# Patient Record
Sex: Male | Born: 1960 | Race: Black or African American | Hispanic: No | Marital: Single | Smoking: Former smoker
Health system: Southern US, Community
[De-identification: ages and names within clinical notes are randomized; demographics above are authoritative.]

## PROBLEM LIST (undated history)

## (undated) DIAGNOSIS — F329 Major depressive disorder, single episode, unspecified: Secondary | ICD-10-CM

## (undated) DIAGNOSIS — J449 Chronic obstructive pulmonary disease, unspecified: Secondary | ICD-10-CM

## (undated) DIAGNOSIS — F32A Depression, unspecified: Secondary | ICD-10-CM

## (undated) DIAGNOSIS — R0981 Nasal congestion: Secondary | ICD-10-CM

## (undated) HISTORY — DX: Chronic obstructive pulmonary disease, unspecified: J44.9

---

## 1999-07-17 ENCOUNTER — Emergency Department (HOSPITAL_COMMUNITY): Admission: EM | Admit: 1999-07-17 | Discharge: 1999-07-17 | Payer: Self-pay | Admitting: Emergency Medicine

## 1999-07-18 ENCOUNTER — Encounter: Payer: Self-pay | Admitting: Emergency Medicine

## 2004-07-20 ENCOUNTER — Emergency Department (HOSPITAL_COMMUNITY): Admission: EM | Admit: 2004-07-20 | Discharge: 2004-07-20 | Payer: Self-pay | Admitting: Emergency Medicine

## 2018-05-27 ENCOUNTER — Emergency Department (HOSPITAL_COMMUNITY): Payer: Self-pay

## 2018-05-27 ENCOUNTER — Other Ambulatory Visit: Payer: Self-pay

## 2018-05-27 ENCOUNTER — Emergency Department (HOSPITAL_COMMUNITY)
Admission: EM | Admit: 2018-05-27 | Discharge: 2018-05-27 | Disposition: A | Discharge status: Home / Self Care | Payer: Self-pay | Attending: Emergency Medicine | Admitting: Emergency Medicine

## 2018-05-27 ENCOUNTER — Encounter (HOSPITAL_COMMUNITY): Payer: Self-pay | Admitting: Emergency Medicine

## 2018-05-27 DIAGNOSIS — N419 Inflammatory disease of prostate, unspecified: Secondary | ICD-10-CM | POA: Insufficient documentation

## 2018-05-27 DIAGNOSIS — N50812 Left testicular pain: Secondary | ICD-10-CM | POA: Insufficient documentation

## 2018-05-27 DIAGNOSIS — N4889 Other specified disorders of penis: Secondary | ICD-10-CM | POA: Insufficient documentation

## 2018-05-27 DIAGNOSIS — R319 Hematuria, unspecified: Secondary | ICD-10-CM

## 2018-05-27 DIAGNOSIS — F17210 Nicotine dependence, cigarettes, uncomplicated: Secondary | ICD-10-CM | POA: Insufficient documentation

## 2018-05-27 DIAGNOSIS — R109 Unspecified abdominal pain: Secondary | ICD-10-CM

## 2018-05-27 LAB — COMPREHENSIVE METABOLIC PANEL
ALT: 8 U/L (ref 0–44)
AST: 14 U/L — ABNORMAL LOW (ref 15–41)
Albumin: 3.4 g/dL — ABNORMAL LOW (ref 3.5–5.0)
Alkaline Phosphatase: 66 U/L (ref 38–126)
Anion gap: 7 (ref 5–15)
BUN: 13 mg/dL (ref 6–20)
CO2: 26 mmol/L (ref 22–32)
Calcium: 8.5 mg/dL — ABNORMAL LOW (ref 8.9–10.3)
Chloride: 104 mmol/L (ref 98–111)
Creatinine, Ser: 1.06 mg/dL (ref 0.61–1.24)
GFR calc Af Amer: >60 mL/min (ref >60)
GFR calc non Af Amer: >60 mL/min (ref >60)
Glucose, Bld: 96 mg/dL (ref 70–99)
Potassium: 3.9 mmol/L (ref 3.5–5.1)
Sodium: 137 mmol/L (ref 135–145)
Total Bilirubin: 0.5 mg/dL (ref 0.3–1.2)
Total Protein: 8.3 g/dL — ABNORMAL HIGH (ref 6.5–8.1)

## 2018-05-27 LAB — URINALYSIS, ROUTINE W REFLEX MICROSCOPIC
Bacteria, UA: NONE SEEN
Bilirubin Urine: NEGATIVE
Glucose, UA: NEGATIVE mg/dL
Hgb urine dipstick: NEGATIVE
Ketones, ur: NEGATIVE mg/dL
Nitrite: NEGATIVE
Protein, ur: NEGATIVE mg/dL
Specific Gravity, Urine: >1.046 — ABNORMAL HIGH (ref 1.005–1.030)
WBC, UA: >50 WBC/hpf — ABNORMAL HIGH (ref 0–5)
pH: 6 (ref 5.0–8.0)

## 2018-05-27 LAB — CBC WITH DIFFERENTIAL/PLATELET
Abs Immature Granulocytes: 0.05 10*3/uL (ref 0.00–0.07)
Basophils Absolute: 0.1 10*3/uL (ref 0.0–0.1)
Basophils Relative: 0 %
Eosinophils Absolute: 0.2 10*3/uL (ref 0.0–0.5)
Eosinophils Relative: 1 %
HCT: 37.3 % — ABNORMAL LOW (ref 39.0–52.0)
Hemoglobin: 11.5 g/dL — ABNORMAL LOW (ref 13.0–17.0)
Immature Granulocytes: 0 %
Lymphocytes Relative: 18 %
Lymphs Abs: 2.5 10*3/uL (ref 0.7–4.0)
MCH: 26.1 pg (ref 26.0–34.0)
MCHC: 30.8 g/dL (ref 30.0–36.0)
MCV: 84.6 fL (ref 80.0–100.0)
Monocytes Absolute: 1.6 10*3/uL — ABNORMAL HIGH (ref 0.1–1.0)
Monocytes Relative: 11 %
Neutro Abs: 9.5 10*3/uL — ABNORMAL HIGH (ref 1.7–7.7)
Neutrophils Relative %: 70 %
Platelets: 391 10*3/uL (ref 150–400)
RBC: 4.41 MIL/uL (ref 4.22–5.81)
RDW: 14.8 % (ref 11.5–15.5)
WBC: 13.9 10*3/uL — ABNORMAL HIGH (ref 4.0–10.5)
nRBC: 0 % (ref 0.0–0.2)

## 2018-05-27 MED ORDER — SODIUM CHLORIDE 0.9 % IV BOLUS
500.0000 mL | Freq: Once | INTRAVENOUS | Status: AC
  Administered 2018-05-27: 500 mL via INTRAVENOUS

## 2018-05-27 MED ORDER — HYDROCODONE-ACETAMINOPHEN 5-325 MG PO TABS
1.0000 | ORAL_TABLET | Freq: Once | ORAL | Status: AC
  Administered 2018-05-27: 1 via ORAL
  Filled 2018-05-27: qty 1

## 2018-05-27 MED ORDER — ACETAMINOPHEN 500 MG PO TABS
1000.0000 mg | ORAL_TABLET | Freq: Once | ORAL | Status: AC
  Administered 2018-05-27: 1000 mg via ORAL
  Filled 2018-05-27: qty 2

## 2018-05-27 MED ORDER — FENTANYL CITRATE (PF) 100 MCG/2ML IJ SOLN
50.0000 ug | INTRAMUSCULAR | Status: DC | PRN
  Administered 2018-05-27: 50 ug via INTRAVENOUS
  Filled 2018-05-27: qty 2

## 2018-05-27 MED ORDER — IOPAMIDOL (ISOVUE-300) INJECTION 61%
100.0000 mL | Freq: Once | INTRAVENOUS | Status: AC | PRN
  Administered 2018-05-27: 100 mL via INTRAVENOUS

## 2018-05-27 MED ORDER — CIPROFLOXACIN HCL 500 MG PO TABS: 500.0000 mg | ORAL_TABLET | Freq: Two times a day (BID) | ORAL | 0 refills | Status: DC

## 2018-05-27 NOTE — ED Triage Notes (Signed)
PT c/o lower middle back pain with hematuria x6 months. PT also c/o hard/painful area to right buttocks beside of rectum with no drainage x2 months.

## 2018-05-27 NOTE — Discharge Instructions (Addendum)
Follow-up closely with urology and a primary doctor for your medical concerns.  Return for significantly worsening symptoms. Take antibiotics. If you were given medicines take as directed.  If you are on coumadin or contraceptives realize their levels and effectiveness is altered by many different medicines.  If you have any reaction (rash, tongues swelling, other) to the medicines stop taking and see a physician.    If your blood pressure was elevated in the ER make sure you follow up for management with a primary doctor or return for chest pain, shortness of breath or stroke symptoms.  Please follow up as directed and return to the ER or see a physician for new or worsening symptoms.  Thank you. Vitals:   05/27/18 0902 05/27/18 0930 05/27/18 1000 05/27/18 1030  BP:  (!) 100/55 (!) 100/56 107/64  Pulse:  68 71 (!) 59  Resp:  18 18 16   Temp:      TempSrc:      SpO2:  100% 100% 100%  Weight: 59 kg     Height: 5' 7.5" (1.715 m)
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
## 2018-05-27 NOTE — ED Notes (Signed)
Pt transported to Ultrasound via [facility].

## 2018-05-28 LAB — URINE CULTURE: Culture: NO GROWTH

## 2018-07-01 ENCOUNTER — Other Ambulatory Visit: Payer: Self-pay | Admitting: Urology

## 2018-07-01 DIAGNOSIS — N360 Urethral fistula: Secondary | ICD-10-CM

## 2018-07-08 ENCOUNTER — Encounter (HOSPITAL_COMMUNITY): Payer: Self-pay

## 2018-07-08 ENCOUNTER — Ambulatory Visit (HOSPITAL_COMMUNITY)
Admission: RE | Admit: 2018-07-08 | Discharge: 2018-07-08 | Disposition: A | Discharge status: Home / Self Care | Payer: Self-pay | Source: Ambulatory Visit | Attending: Urology | Admitting: Urology

## 2018-07-08 DIAGNOSIS — N360 Urethral fistula: Secondary | ICD-10-CM | POA: Insufficient documentation

## 2018-07-10 ENCOUNTER — Encounter (HOSPITAL_COMMUNITY): Payer: Self-pay | Admitting: Certified Registered Nurse Anesthetist

## 2018-07-10 ENCOUNTER — Telehealth (HOSPITAL_COMMUNITY): Payer: Self-pay | Admitting: *Deleted

## 2018-07-10 ENCOUNTER — Ambulatory Visit (HOSPITAL_COMMUNITY): Admission: RE | Admit: 2018-07-10 | Payer: Self-pay | Source: Home / Self Care | Admitting: Urology

## 2018-07-10 ENCOUNTER — Encounter (HOSPITAL_COMMUNITY): Admission: RE | Payer: Self-pay | Source: Home / Self Care

## 2018-07-10 SURGERY — CYSTOSCOPY, WITH BIOPSY
Anesthesia: General

## 2018-07-29 ENCOUNTER — Other Ambulatory Visit: Payer: Self-pay | Admitting: Urology

## 2018-08-06 NOTE — Patient Instructions (Addendum)
Gregory Camacho  08/06/2018   Your procedure is scheduled on: 08-15-18    Report to [facility] Main  Entrance    Report to Admitting at 5:30 AM    Call this number if you have problems the morning of surgery [phone]    Remember: Do not eat food or drink liquids :After Midnight.    BRUSH YOUR TEETH MORNING OF SURGERY AND RINSE YOUR MOUTH OUT, NO CHEWING GUM CANDY OR MINTS.     Take these medicines the morning of surgery with A SIP OF WATER: None                                You may not have any metal on your body including hair pins and              piercings  Do not wear jewelry, cologne, lotions, powders or deodorant             Men may shave face and neck.   Do not bring valuables to the hospital. [facility] IS NOT             RESPONSIBLE   FOR VALUABLES.  Contacts, dentures or bridgework may not be worn into surgery.  Leave suitcase in the car. After surgery it may be brought to your room.     Patients discharged the day of surgery will not be allowed to drive home. IF YOU ARE HAVING SURGERY AND GOING HOME THE SAME DAY, YOU MUST HAVE AN ADULT TO DRIVE YOU HOME AND BE WITH YOU FOR 24 HOURS. YOU MAY GO HOME BY TAXI OR UBER OR ORTHERWISE, BUT AN ADULT MUST ACCOMPANY YOU HOME AND STAY WITH YOU FOR 24 HOURS.   Name of Transporter: Philis Nettle [phone]  Special Instructions: N/A              Please read over the following fact sheets you were given: _____________________________________________________________________          [facility] - Preparing for Surgery Before surgery, you can play an important role.  Because skin is not sterile, your skin needs to be as free of germs as possible.  You can reduce the number of germs on your skin by washing with CHG (chlorahexidine gluconate) soap before surgery.  CHG is an antiseptic cleaner which kills germs and bonds with the skin to continue killing germs even after washing. Please DO NOT use if  you have an allergy to CHG or antibacterial soaps.  If your skin becomes reddened/irritated stop using the CHG and inform your nurse when you arrive at Short Stay. Do not shave (including legs and underarms) for at least 48 hours prior to the first CHG shower.  You may shave your face/neck. Please follow these instructions carefully:  1.  Shower with CHG Soap the night before surgery and the  morning of Surgery.  2.  If you choose to wash your hair, wash your hair first as usual with your  normal  shampoo.  3.  After you shampoo, rinse your hair and body thoroughly to remove the  shampoo.                           4.  Use CHG as you would any other liquid soap.  You can apply  chg directly  to the skin and wash                       Gently with a scrungie or clean washcloth.  5.  Apply the CHG Soap to your body ONLY FROM THE NECK DOWN.   Do not use on face/ open                           Wound or open sores. Avoid contact with eyes, ears mouth and genitals (private parts).                       Wash face,  Genitals (private parts) with your normal soap.             6.  Wash thoroughly, paying special attention to the area where your surgery  will be performed.  7.  Thoroughly rinse your body with warm water from the neck down.  8.  DO NOT shower/wash with your normal soap after using and rinsing off  the CHG Soap.                9.  Pat yourself dry with a clean towel.            10.  Wear clean pajamas.            11.  Place clean sheets on your bed the night of your first shower and do not  sleep with pets. Day of Surgery : Do not apply any lotions/deodorants the morning of surgery.  Please wear clean clothes to the hospital/surgery center.  FAILURE TO FOLLOW THESE INSTRUCTIONS MAY RESULT IN THE CANCELLATION OF YOUR SURGERY PATIENT SIGNATURE_________________________________  NURSE  SIGNATURE__________________________________  ________________________________________________________________________

## 2018-08-07 ENCOUNTER — Encounter (HOSPITAL_COMMUNITY): Payer: Self-pay

## 2018-08-07 ENCOUNTER — Encounter (HOSPITAL_COMMUNITY)
Admission: RE | Admit: 2018-08-07 | Discharge: 2018-08-07 | Disposition: A | Discharge status: Home / Self Care | Payer: Self-pay | Source: Ambulatory Visit | Attending: Urology | Admitting: Urology

## 2018-08-07 ENCOUNTER — Other Ambulatory Visit: Payer: Self-pay

## 2018-08-07 DIAGNOSIS — Z01812 Encounter for preprocedural laboratory examination: Secondary | ICD-10-CM | POA: Insufficient documentation

## 2018-08-07 HISTORY — DX: Nasal congestion: R09.81

## 2018-08-07 HISTORY — DX: Major depressive disorder, single episode, unspecified: F32.9

## 2018-08-07 HISTORY — DX: Depression, unspecified: F32.A

## 2018-08-07 LAB — BASIC METABOLIC PANEL
ANION GAP: 8 (ref 5–15)
BUN: 11 mg/dL (ref 6–20)
CO2: 24 mmol/L (ref 22–32)
Calcium: 8.6 mg/dL — ABNORMAL LOW (ref 8.9–10.3)
Chloride: 108 mmol/L (ref 98–111)
Creatinine, Ser: 0.7 mg/dL (ref 0.61–1.24)
GFR calc Af Amer: >60 mL/min (ref >60)
GFR calc non Af Amer: >60 mL/min (ref >60)
Glucose, Bld: 99 mg/dL (ref 70–99)
Potassium: 3.9 mmol/L (ref 3.5–5.1)
Sodium: 140 mmol/L (ref 135–145)

## 2018-08-07 LAB — CBC
HCT: 35 % — ABNORMAL LOW (ref 39.0–52.0)
Hemoglobin: 10.6 g/dL — ABNORMAL LOW (ref 13.0–17.0)
MCH: 25.8 pg — ABNORMAL LOW (ref 26.0–34.0)
MCHC: 30.3 g/dL (ref 30.0–36.0)
MCV: 85.2 fL (ref 80.0–100.0)
Platelets: 372 10*3/uL (ref 150–400)
RBC: 4.11 MIL/uL — ABNORMAL LOW (ref 4.22–5.81)
RDW: 17.3 % — ABNORMAL HIGH (ref 11.5–15.5)
WBC: 10.1 10*3/uL (ref 4.0–10.5)
nRBC: 0 % (ref 0.0–0.2)

## 2018-08-08 LAB — URINE CULTURE: Culture: NO GROWTH

## 2018-08-14 NOTE — H&P (Signed)
Urology History and Physical  Attending: Berniece Salines, MD  Chief Complaint: urethrocutaneous fistlua  HPI: Gregory Camacho is a 58 y.o. 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 Patient unable to tolerate office-based diagnostic procedures and presents today for EUA, cystourethroscopy with RUG and possible biopsy. 2/12 UCx no growth.  Today, patient denies fevers/chills, nausea/vomiting, hematuria, dysuria or concern for UTI.   Past Medical History: Past Medical History:  Diagnosis Date  . Depression   . Sinus congestion     Past Surgical History:  No past surgical history on file.  Medication: No current facility-administered medications for this encounter.    Current Outpatient Medications  Medication Sig Dispense Refill  . ibuprofen (ADVIL,MOTRIN) 200 MG tablet Take 400 mg by mouth every 6 (six) hours as needed for moderate pain.    . ciprofloxacin (CIPRO) 500 MG tablet Take 1 tablet (500 mg total) by mouth 2 (two) times daily. (Patient not taking: Reported on 07/31/2018) 28 tablet 0    Allergies: No Known Allergies  Social History: Social History   Tobacco Use  . Smoking status: Current Every Day Smoker    Packs/day: 1.00    Years: 30.00    Pack years: 30.00    Types: Cigarettes  . Smokeless tobacco: Never Used  Substance Use Topics  . Alcohol use: Yes    Alcohol/week: 2.0 standard drinks    Types: 1 Cans of beer, 1 Shots of liquor per week    Comment: occa  . Drug use: Yes    Types: Marijuana    Comment: occassionally (last 05/23/18)    Family History No family history on file.  Review of Systems 10 systems were reviewed and are negative except as noted specifically in the HPI.  Objective   Vital signs in last 24 hours: There were no vitals taken for this visit.  Physical Exam General: Thin, NAD, A&O, resting, appropriate HEENT: [place]/AT, EOMI, MMM Pulmonary: Normal work of breathing Cardiovascular: HDS, adequate  peripheral perfusion Abdomen: Soft, NTTP, nondistended Extremities: warm and well perfused Neuro: Appropriate, no focal neurological deficits  Most Recent Labs: Lab Results  Component Value Date   WBC 10.1 08/07/2018   HGB 10.6 (L) 08/07/2018   HCT 35.0 (L) 08/07/2018   PLT 372 08/07/2018    Lab Results  Component Value Date   NA 140 08/07/2018   K 3.9 08/07/2018   CL 108 08/07/2018   CO2 24 08/07/2018   BUN 11 08/07/2018   CREATININE 0.70 08/07/2018   CALCIUM 8.6 (L) 08/07/2018    No results found for: INR, APTT   IMAGING: No results found.  ------  Assessment:  58 y.o. 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 He presents today for EUA with cystourethroscopy and RUG, possible biopsy. Risks and benefits of the procedure discussed. Patient wishes to proceed.   Plan: - Proceed to OR as planned.

## 2018-08-14 NOTE — Anesthesia Preprocedure Evaluation (Addendum)
Anesthesia Evaluation  Patient identified by MRN, date of birth, ID band Patient awake    Reviewed: Allergy & Precautions, H&P , NPO status , Patient's Chart, lab work & pertinent test results, reviewed documented beta blocker date and time   Airway Mallampati: II  TM Distance: >3 FB Neck ROM: full    Dental no notable dental hx. (+) Poor Dentition, Chipped, Missing, Loose,    Pulmonary neg pulmonary ROS, Current Smoker,    Pulmonary exam normal breath sounds clear to auscultation       Cardiovascular Exercise Tolerance: Good negative cardio ROS   Rhythm:regular Rate:Normal     Neuro/Psych negative neurological ROS  negative psych ROS   GI/Hepatic negative GI ROS, Neg liver ROS,   Endo/Other  negative endocrine ROS  Renal/GU negative Renal ROS  negative genitourinary   Musculoskeletal   Abdominal   Peds  Hematology  (+) Blood dyscrasia, anemia ,   Anesthesia Other Findings   Reproductive/Obstetrics negative OB ROS                            Anesthesia Physical Anesthesia Plan  ASA: II  Anesthesia Plan: General   Post-op Pain Management:    Induction: Intravenous  PONV Risk Score and Plan: 2 and Ondansetron and Treatment may vary due to age or medical condition  Airway Management Planned: LMA  Additional Equipment:   Intra-op Plan:   Post-operative Plan: Extubation in OR  Informed Consent: I have reviewed the patients History and Physical, chart, labs and discussed the procedure including the risks, benefits and alternatives for the proposed anesthesia with the patient or authorized representative who has indicated his/her understanding and acceptance.     Dental Advisory Given  Plan Discussed with: CRNA, Anesthesiologist and Surgeon  Anesthesia Plan Comments: ( )        Anesthesia Quick Evaluation

## 2018-08-15 ENCOUNTER — Ambulatory Visit (HOSPITAL_COMMUNITY): Payer: Self-pay | Admitting: Anesthesiology

## 2018-08-15 ENCOUNTER — Ambulatory Visit (HOSPITAL_COMMUNITY): Payer: Self-pay

## 2018-08-15 ENCOUNTER — Telehealth (HOSPITAL_COMMUNITY): Payer: Self-pay | Admitting: *Deleted

## 2018-08-15 ENCOUNTER — Encounter (HOSPITAL_COMMUNITY): Payer: Self-pay | Admitting: *Deleted

## 2018-08-15 ENCOUNTER — Ambulatory Visit (HOSPITAL_COMMUNITY)
Admission: RE | Admit: 2018-08-15 | Discharge: 2018-08-15 | Disposition: A | Discharge status: Home / Self Care | Payer: Self-pay | Attending: Urology | Admitting: Urology

## 2018-08-15 ENCOUNTER — Ambulatory Visit (HOSPITAL_COMMUNITY): Payer: Self-pay | Admitting: Physician Assistant

## 2018-08-15 ENCOUNTER — Encounter (HOSPITAL_COMMUNITY)
Admission: RE | Disposition: A | Discharge status: Home / Self Care | Payer: Self-pay | Source: Home / Self Care | Attending: Urology

## 2018-08-15 DIAGNOSIS — N5089 Other specified disorders of the male genital organs: Secondary | ICD-10-CM | POA: Insufficient documentation

## 2018-08-15 DIAGNOSIS — F17210 Nicotine dependence, cigarettes, uncomplicated: Secondary | ICD-10-CM | POA: Insufficient documentation

## 2018-08-15 DIAGNOSIS — Z79899 Other long term (current) drug therapy: Secondary | ICD-10-CM | POA: Insufficient documentation

## 2018-08-15 DIAGNOSIS — N492 Inflammatory disorders of scrotum: Secondary | ICD-10-CM | POA: Insufficient documentation

## 2018-08-15 DIAGNOSIS — D649 Anemia, unspecified: Secondary | ICD-10-CM | POA: Insufficient documentation

## 2018-08-15 DIAGNOSIS — N35919 Unspecified urethral stricture, male, unspecified site: Secondary | ICD-10-CM | POA: Insufficient documentation

## 2018-08-15 DIAGNOSIS — Z791 Long term (current) use of non-steroidal anti-inflammatories (NSAID): Secondary | ICD-10-CM | POA: Insufficient documentation

## 2018-08-15 DIAGNOSIS — L02215 Cutaneous abscess of perineum: Secondary | ICD-10-CM | POA: Insufficient documentation

## 2018-08-15 DIAGNOSIS — F329 Major depressive disorder, single episode, unspecified: Secondary | ICD-10-CM | POA: Insufficient documentation

## 2018-08-15 HISTORY — PX: CYSTOSCOPY/RETROGRADE/URETEROSCOPY: SHX5316

## 2018-08-15 SURGERY — CYSTOSCOPY/RETROGRADE/URETEROSCOPY
Anesthesia: General

## 2018-08-15 MED ORDER — LIDOCAINE 2% (20 MG/ML) 5 ML SYRINGE
INTRAMUSCULAR | Status: DC | PRN
  Administered 2018-08-15: 100 mg via INTRAVENOUS

## 2018-08-15 MED ORDER — PHENYLEPHRINE 40 MCG/ML (10ML) SYRINGE FOR IV PUSH (FOR BLOOD PRESSURE SUPPORT)
PREFILLED_SYRINGE | INTRAVENOUS | Status: AC
  Filled 2018-08-15: qty 10

## 2018-08-15 MED ORDER — DIATRIZOATE MEGLUMINE 30 % UR SOLN
URETHRAL | Status: AC
  Filled 2018-08-15: qty 100

## 2018-08-15 MED ORDER — FENTANYL CITRATE (PF) 100 MCG/2ML IJ SOLN
INTRAMUSCULAR | Status: AC
  Filled 2018-08-15: qty 2

## 2018-08-15 MED ORDER — STERILE WATER FOR IRRIGATION IR SOLN
Status: DC | PRN
  Administered 2018-08-15: 3000 mL

## 2018-08-15 MED ORDER — FENTANYL CITRATE (PF) 100 MCG/2ML IJ SOLN: 25.0000 ug | INTRAMUSCULAR | Status: DC | PRN

## 2018-08-15 MED ORDER — ACETAMINOPHEN 160 MG/5ML PO SOLN: 325.0000 mg | ORAL | Status: DC | PRN

## 2018-08-15 MED ORDER — LACTATED RINGERS IV SOLN
INTRAVENOUS | Status: DC
  Administered 2018-08-15: 06:00:00 via INTRAVENOUS

## 2018-08-15 MED ORDER — CEFAZOLIN SODIUM-DEXTROSE 2-4 GM/100ML-% IV SOLN
2.0000 g | INTRAVENOUS | Status: AC
  Administered 2018-08-15: 2 g via INTRAVENOUS
  Filled 2018-08-15: qty 100

## 2018-08-15 MED ORDER — MEPERIDINE HCL 50 MG/ML IJ SOLN: 6.2500 mg | INTRAMUSCULAR | Status: DC | PRN

## 2018-08-15 MED ORDER — PHENYLEPHRINE 40 MCG/ML (10ML) SYRINGE FOR IV PUSH (FOR BLOOD PRESSURE SUPPORT)
PREFILLED_SYRINGE | INTRAVENOUS | Status: DC | PRN
  Administered 2018-08-15 (×2): 80 ug via INTRAVENOUS

## 2018-08-15 MED ORDER — ONDANSETRON HCL 4 MG/2ML IJ SOLN
INTRAMUSCULAR | Status: AC
  Filled 2018-08-15: qty 2

## 2018-08-15 MED ORDER — ACETAMINOPHEN 325 MG PO TABS: 325.0000 mg | ORAL_TABLET | ORAL | Status: DC | PRN

## 2018-08-15 MED ORDER — BUPIVACAINE HCL (PF) 0.5 % IJ SOLN
INTRAMUSCULAR | Status: DC | PRN
  Administered 2018-08-15: 10 mL

## 2018-08-15 MED ORDER — TRAMADOL HCL 50 MG PO TABS: 50.0000 mg | ORAL_TABLET | Freq: Four times a day (QID) | ORAL | 0 refills | Status: AC | PRN

## 2018-08-15 MED ORDER — AMOXICILLIN-POT CLAVULANATE 875-125 MG PO TABS: 1.0000 | ORAL_TABLET | Freq: Two times a day (BID) | ORAL | 0 refills | Status: AC

## 2018-08-15 MED ORDER — PROPOFOL 10 MG/ML IV BOLUS
INTRAVENOUS | Status: AC
  Filled 2018-08-15: qty 20

## 2018-08-15 MED ORDER — OXYCODONE HCL 5 MG PO TABS
ORAL_TABLET | ORAL | Status: AC
  Filled 2018-08-15: qty 1

## 2018-08-15 MED ORDER — MIDAZOLAM HCL 2 MG/2ML IJ SOLN
INTRAMUSCULAR | Status: AC
  Filled 2018-08-15: qty 2

## 2018-08-15 MED ORDER — OXYCODONE HCL 5 MG PO TABS
5.0000 mg | ORAL_TABLET | Freq: Once | ORAL | Status: AC | PRN
  Administered 2018-08-15: 5 mg via ORAL

## 2018-08-15 MED ORDER — IOHEXOL 300 MG/ML  SOLN
INTRAMUSCULAR | Status: DC | PRN
  Administered 2018-08-15: 20 mL via URETHRAL

## 2018-08-15 MED ORDER — MIDAZOLAM HCL 5 MG/5ML IJ SOLN
INTRAMUSCULAR | Status: DC | PRN
  Administered 2018-08-15: 2 mg via INTRAVENOUS

## 2018-08-15 MED ORDER — DEXAMETHASONE SODIUM PHOSPHATE 10 MG/ML IJ SOLN
INTRAMUSCULAR | Status: DC | PRN
  Administered 2018-08-15: 10 mg via INTRAVENOUS

## 2018-08-15 MED ORDER — PROPOFOL 10 MG/ML IV BOLUS
INTRAVENOUS | Status: DC | PRN
  Administered 2018-08-15: 120 mg via INTRAVENOUS

## 2018-08-15 MED ORDER — DEXAMETHASONE SODIUM PHOSPHATE 10 MG/ML IJ SOLN
INTRAMUSCULAR | Status: AC
  Filled 2018-08-15: qty 1

## 2018-08-15 MED ORDER — BUPIVACAINE HCL (PF) 0.5 % IJ SOLN
INTRAMUSCULAR | Status: AC
  Filled 2018-08-15: qty 30

## 2018-08-15 MED ORDER — OXYCODONE HCL 5 MG/5ML PO SOLN: 5.0000 mg | Freq: Once | ORAL | Status: AC | PRN

## 2018-08-15 MED ORDER — FENTANYL CITRATE (PF) 100 MCG/2ML IJ SOLN
INTRAMUSCULAR | Status: DC | PRN
  Administered 2018-08-15 (×4): 25 ug via INTRAVENOUS

## 2018-08-15 MED ORDER — LIDOCAINE 2% (20 MG/ML) 5 ML SYRINGE
INTRAMUSCULAR | Status: AC
  Filled 2018-08-15: qty 5

## 2018-08-15 MED ORDER — ONDANSETRON HCL 4 MG/2ML IJ SOLN
INTRAMUSCULAR | Status: DC | PRN
  Administered 2018-08-15: 4 mg via INTRAVENOUS

## 2018-08-15 MED ORDER — ONDANSETRON HCL 4 MG/2ML IJ SOLN: 4.0000 mg | Freq: Once | INTRAMUSCULAR | Status: DC | PRN

## 2018-08-15 SURGICAL SUPPLY — 28 items
BAG URO CATCHER STRL LF (MISCELLANEOUS) ×4 IMPLANT
BALLN NEPHROSTOMY (BALLOONS) ×4
BALLOON NEPHROSTOMY (BALLOONS) ×2 IMPLANT
BASKET ZERO TIP NITINOL 2.4FR (BASKET) IMPLANT
BSKT STON RTRVL ZERO TP 2.4FR (BASKET)
CATH FOLEY 2WAY SLVR  5CC 16FR (CATHETERS) ×2
CATH FOLEY 2WAY SLVR 5CC 16FR (CATHETERS) ×2 IMPLANT
CATH URET 5FR 28IN OPEN ENDED (CATHETERS) ×4 IMPLANT
CLOTH BEACON ORANGE TIMEOUT ST (SAFETY) ×4 IMPLANT
COVER WAND RF STERILE (DRAPES) IMPLANT
ELECT REM PT RETURN 15FT ADLT (MISCELLANEOUS) ×4 IMPLANT
EXTRACTOR STONE 1.7FRX115CM (UROLOGICAL SUPPLIES) IMPLANT
GLOVE BIOGEL M STRL SZ7.5 (GLOVE) ×16 IMPLANT
GOWN STRL REUS W/TWL XL LVL3 (GOWN DISPOSABLE) ×8 IMPLANT
GUIDEWIRE ANG ZIPWIRE 038X150 (WIRE) IMPLANT
GUIDEWIRE STR DUAL SENSOR (WIRE) ×4 IMPLANT
LOOP CUT BIPOLAR 24F LRG (ELECTROSURGICAL) IMPLANT
MANIFOLD NEPTUNE II (INSTRUMENTS) ×4 IMPLANT
NDL SAFETY ECLIPSE 18X1.5 (NEEDLE) ×2 IMPLANT
NEEDLE HYPO 18GX1.5 SHARP (NEEDLE) ×2
PACK CYSTO (CUSTOM PROCEDURE TRAY) ×4 IMPLANT
SHEATH URETERAL 12FRX28CM (UROLOGICAL SUPPLIES) IMPLANT
SHEATH URETERAL 12FRX35CM (MISCELLANEOUS) IMPLANT
SYRINGE IRR TOOMEY STRL 70CC (SYRINGE) IMPLANT
TUBING CONNECTING 10 (TUBING) ×3 IMPLANT
TUBING CONNECTING 10' (TUBING) ×1
TUBING UROLOGY SET (TUBING) ×4 IMPLANT
WIRE COONS/BENSON .038X145CM (WIRE) IMPLANT

## 2018-08-15 NOTE — Op Note (Addendum)
PATIENT:  Gregory Camacho  Preoperative diagnosis:  1. Urethrocutaneous fistula 2. Scrotal abscess 3. Perineal abscess  Postoperative diagnosis:  1. Same  Procedure:  1. Exam under anesthesia 2. Cystourethroscopy 3. Retrograde urethrogram 4. Intraoperative fluoroscopy with interpretation <1 hr 5. Balloon dilation of urethral stricture 6. Complicated Foley catheter placement 7. Incision and drainage of scrotal and perineal abscesses  Surgeon(s):  Berniece Salines, M.D. Case Lucretia Roers, M.D. (resident)  Anesthesia: General  Complications: None  EBL: Minimal  Specimens: None  Indication: Gregory Camacho is a 58 y.o. male with the above diagnoses. After reviewing the management options for treatment, they have elected to proceed with the above surgical procedure(s). We have discussed the potential benefits and risks of the procedure, side effects of the proposed treatment, the likelihood of the patient achieving the goals of the procedure, and any potential problems that might occur during the procedure or recuperation. Informed consent has been obtained.  Findings:   1.) ~1cm bulbar urethral stricture noted on retrograde urethrogram with focal/contained area of extravasation, successfully balloon dilated, urethroscopy otherwise normal without visible urethral defect  2.) Normal cystoscopy without evidence of lesions, masses or stones 3.) Successful placement of Foley catheter over wire 4.) Successful I&D and packing of abscesses noted along left hemiscrotum and right perineum  Description of procedure:   The patient was taken to the operating room and general anesthesia was induced.  The patient was placed in the dorsal lithotomy position with the hips rotated obliquely. He was prepped and draped in the usual sterile fashion, and preoperative antibiotics were administered. A preoperative time-out was performed.  A 16Fr 2-way Foley catheter was inserted into the distal urethra and  the balloon filled with ~2 mL sterile water. A retrograde urethrogram was performed with findings as noted above.  The catheter was removed ad cystourethroscopy was performed revealing a focal area of stricture at the bulbar urethra that could not be dilated with the rigid cystoscope. A sensor wire was passed through the stricture and into the bladder, confirmed under fluoroscopy. A 16Fr balloon dilator was then passed over the sensor wire across the area of stricture. The balloon was inflated and left in place for approximately 60 seconds. The balloon was taken down and then the rigid cystoscope was inserted per urethra alongside the sensor wire. The remainder of the urethra appeared normal without any visible evidence of a defect proximal to the stricture. The cystoscope was advanced into the bladder and thorough cystoscopy was normal without evidence of lesions, masses or stones.  The cystoscope was removed and an 18Fr 2-way Council-tip catheter was advanced over the sensor wire and into the bladder. Draining urine was clear yellow. 10 mL sterile water was instilled into the balloon.  Attention was then turned to the scrotum and perineum where there were two small fluctuant abscesses approximately 2 cm in size. Local anesthetic was administered. There was no surrounding erythema/cellulitis. Using an 11 blade scalpel, incisions were made in each abscess with immediate drainage of purulent fluid. A hemostat was used to break up loculations. The wounds were then each copiously irrigated and then packed separately with 1/4" tape.  The patient appeared to tolerate the procedure well and without complications.  The patient was able to be awakened and transferred to the recovery unit in satisfactory condition.  Plan: - Discharge home once meets PACU criteria - Complete 14 day course Augmentin - Scrotal/perineal wound care instructions provided in both written and verbal form - Continue Foley catheter to  drainage per nursing instruction - Follow up 1 month for TOV and wound check

## 2018-08-15 NOTE — Discharge Instructions (Signed)
Indwelling Urinary Catheter Care, Adult °An indwelling urinary catheter is a thin tube that is put into your bladder. The tube helps to drain pee (urine) out of your body. The tube goes in through your urethra. Your urethra is where pee comes out of your body. Your pee will come out through the catheter, then it will go into a bag (drainage bag). °Take Pariss Hommes care of your catheter so it will work well. °How to wear your catheter and bag °Supplies needed °· Sticky tape (adhesive tape) or a leg strap. °· Alcohol wipe or soap and water (if you use tape). °· A clean towel (if you use tape). °· Large overnight bag. °· Smaller bag (leg bag). °Wearing your catheter °Attach your catheter to your leg with tape or a leg strap. °· Make sure the catheter is not pulled tight. °· If a leg strap gets wet, take it off and put on a dry strap. °· If you use tape to hold the bag on your leg: °1. Use an alcohol wipe or soap and water to wash your skin where the tape made it sticky before. °2. Use a clean towel to pat-dry that skin. °3. Use new tape to make the bag stay on your leg. °Wearing your bags °You should have been given a large overnight bag. °· You may wear the overnight bag in the day or night. °· Always have the overnight bag lower than your bladder.  Do not let the bag touch the floor. °· Before you go to sleep, put a clean plastic bag in a wastebasket. Then hang the overnight bag inside the wastebasket. °You should also have a smaller leg bag that fits under your clothes. °· Always wear the leg bag below your knee. °· Do not wear your leg bag at night. °How to care for your skin and catheter °Supplies needed °· A clean washcloth. °· Water and mild soap. °· A clean towel. °Caring for your skin and catheter ° °  ° °· Clean the skin around your catheter every day: °? Wash your hands with soap and water. °? Wet a clean washcloth in warm water and mild soap. °? Clean the skin around your urethra. °? If you are male: °? Gently  spread the folds of skin around your vagina (labia). °? With the washcloth in your other hand, wipe the inner side of your labia on each side. Wipe from front to back. °? If you are male: °? Pull back any skin that covers the end of your penis (foreskin). °? With the washcloth in your other hand, wipe your penis in small circles. Start wiping at the tip of your penis, then move away from the catheter. °? With your free hand, hold the catheter close to where it goes into your body. °? Keep holding the catheter during cleaning so it does not get pulled out. °? With the washcloth in your other hand, clean the catheter. °? Only wipe downward on the catheter. °? Do not wipe upward toward your body. Doing this may push germs into your urethra and cause infection. °? Use a clean towel to pat-dry the catheter and the skin around it. Make sure to wipe off all soap. °? Wash your hands with soap and water. °· Shower every day. Do not take baths. °· Do not use cream, ointment, or lotion on the area where the catheter goes into your body, unless your doctor tells you to. °· Do not use powders, sprays, or lotions   on your genital area. °· Check your skin around the catheter every day for signs of infection. Check for: °? Redness, swelling, or pain. °? Fluid or blood. °? Warmth. °? Pus or a bad smell. °How to empty the bag °Supplies needed °· Rubbing alcohol. °· Gauze pad or cotton ball. °· Tape or a leg strap. °Emptying the bag °Pour the pee out of your bag when it is ?-½ full, or at least 2-3 times a day. Do this for your overnight bag and your leg bag. °1. Wash your hands with soap and water. °2. Separate (detach) the bag from your leg. °3. Hold the bag over the toilet or a clean pail. Keep the bag lower than your hips and bladder. This is so the pee (urine) does not go back into the tube. °4. Open the pour spout. It is at the bottom of the bag. °5. Empty the pee into the toilet or pail. Do not let the pour spout touch any  surface. °6. Put rubbing alcohol on a gauze pad or cotton ball. °7. Use the gauze pad or cotton ball to clean the pour spout. °8. Close the pour spout. °9. Attach the bag to your leg with tape or a leg strap. °10. Wash your hands with soap and water. °Follow instructions for cleaning the drainage bag: °· From the product maker. °· As told by your doctor. °How to change the bag °Supplies needed °· Alcohol wipes. °· A clean bag. °· Tape or a leg strap. °Changing the bag °Replace your bag with a clean bag once a month. If it starts to leak, smell bad, or look dirty, change it sooner. °1. Wash your hands with soap and water. °2. Separate the dirty bag from your leg. °3. Pinch the catheter with your fingers so that pee does not spill out. °4. Separate the catheter tube from the bag tube where these tubes connect (at the connection valve). Do not let the tubes touch any surface. °5. Clean the end of the catheter tube with an alcohol wipe. Use a different alcohol wipe to clean the end of the bag tube. °6. Connect the catheter tube to the tube of the clean bag. °7. Attach the clean bag to your leg with tape or a leg strap. Do not make the bag tight on your leg. °8. Wash your hands with soap and water. °General rules ° °· Never pull on your catheter. Never try to take it out. Doing that can hurt you. °· Always wash your hands before and after you touch your catheter or bag. Use a mild, fragrance-free soap. If you do not have soap and water, use hand sanitizer. °· Always make sure there are no twists or bends (kinks) in the catheter tube. °· Always make sure there are no leaks in the catheter or bag. °· Drink enough fluid to keep your pee pale yellow. °· Do not take baths, swim, or use a hot tub. °· If you are male, wipe from front to back after you poop (have a bowel movement). °Contact a doctor if: °· Your pee is cloudy. °· Your pee smells worse than usual. °· Your catheter gets clogged. °· Your catheter leaks. °· Your  bladder feels full. °Get help right away if: °· You have redness, swelling, or pain where the catheter goes into your body. °· You have fluid, blood, pus, or a bad smell coming from the area where the catheter goes into your body. °· Your skin feels warm   where the catheter goes into your body. °· You have a fever. °· You have pain in your: °? Belly (abdomen). °? Legs. °? Lower back. °? Bladder. °· You see blood in the catheter. °· Your pee is pink or red. °· You feel sick to your stomach (nauseous). °· You throw up (vomit). °· You have chills. °· Your pee is not draining into the bag. °· Your catheter gets pulled out. °Summary °· An indwelling urinary catheter is a thin tube that is placed into the bladder to help drain pee (urine) out of the body. °· The catheter is placed into the part of the body that drains pee from the bladder (urethra). °· Taking Gregory Camacho care of your catheter will keep it working properly and help prevent problems. °· Always wash your hands before and after touching your catheter or bag. °· Never pull on your catheter or try to take it out. °This information is not intended to replace advice given to you by your health care provider. Make sure you discuss any questions you have with your health care provider. °Document Released: 10/07/2012 Document Revised: 12/03/2017 Document Reviewed: 01/26/2017 °Elsevier Interactive Patient Education © 2019 [employer] ° ° °General Anesthesia, Adult, Care After °This sheet gives you information about how to care for yourself after your procedure. Your health care provider may also give you more specific instructions. If you have problems or questions, contact your health care provider. °What can I expect after the procedure? °After the procedure, the following side effects are common: °· Pain or discomfort at the IV site. °· Nausea. °· Vomiting. °· Sore throat. °· Trouble concentrating. °· Feeling cold or chills. °· Weak or tired. °· Sleepiness and  fatigue. °· Soreness and body aches. These side effects can affect parts of the body that were not involved in surgery. °Follow these instructions at home: ° °For at least 24 hours after the procedure: °· Have a responsible adult stay with you. It is important to have someone help care for you until you are awake and alert. °· Rest as needed. °· Do not: °? Participate in activities in which you could fall or become injured. °? Drive. °? Use heavy machinery. °? Drink alcohol. °? Take sleeping pills or medicines that cause drowsiness. °? Make important decisions or sign legal documents. °? Take care of children on your own. °Eating and drinking °· Follow any instructions from your health care provider about eating or drinking restrictions. °· When you feel hungry, start by eating small amounts of foods that are soft and easy to digest (bland), such as toast. Gradually return to your regular diet. °· Drink enough fluid to keep your urine pale yellow. °· If you vomit, rehydrate by drinking water, juice, or clear broth. °General instructions °· If you have sleep apnea, surgery and certain medicines can increase your risk for breathing problems. Follow instructions from your health care provider about wearing your sleep device: °? Anytime you are sleeping, including during daytime naps. °? While taking prescription pain medicines, sleeping medicines, or medicines that make you drowsy. °· Return to your normal activities as told by your health care provider. Ask your health care provider what activities are safe for you. °· Take over-the-counter and prescription medicines only as told by your health care provider. °· If you smoke, do not smoke without supervision. °· Keep all follow-up visits as told by your health care provider. This is important. °Contact a health care provider if: °· You have nausea   or vomiting that does not get better with medicine. °· You cannot eat or drink without vomiting. °· You have pain that  does not get better with medicine. °· You are unable to pass urine. °· You develop a skin rash. °· You have a fever. °· You have redness around your IV site that gets worse. °Get help right away if: °· You have difficulty breathing. °· You have chest pain. °· You have blood in your urine or stool, or you vomit blood. °Summary °· After the procedure, it is common to have a sore throat or nausea. It is also common to feel tired. °· Have a responsible adult stay with you for the first 24 hours after general anesthesia. It is important to have someone help care for you until you are awake and alert. °· When you feel hungry, start by eating small amounts of foods that are soft and easy to digest (bland), such as toast. Gradually return to your regular diet. °· Drink enough fluid to keep your urine pale yellow. °· Return to your normal activities as told by your health care provider. Ask your health care provider what activities are safe for you. °This information is not intended to replace advice given to you by your health care provider. Make sure you discuss any questions you have with your health care provider. °Document Released: 09/18/2000 Document Revised: 01/26/2017 Document Reviewed: 01/26/2017 °Elsevier Interactive Patient Education © 2019 [employer] ° ° °

## 2018-08-15 NOTE — Anesthesia Procedure Notes (Signed)
Procedure Name: LMA Insertion Date/Time: 08/15/2018 7:36 AM Performed by: Florene Route, CRNA Patient Re-evaluated:Patient Re-evaluated prior to induction Oxygen Delivery Method: Circle system utilized Preoxygenation: Pre-oxygenation with 100% oxygen Induction Type: IV induction Ventilation: Mask ventilation without difficulty LMA: LMA inserted LMA Size: 4.0 Number of attempts: 1 Placement Confirmation: positive ETCO2 and breath sounds checked- equal and bilateral Tube secured with: Tape Dental Injury: Teeth and Oropharynx as per pre-operative assessment

## 2018-08-15 NOTE — Interval H&P Note (Signed)
History and Physical Interval Note:  08/15/2018 8:24 AM  Gregory Camacho  has presented today for surgery, with the diagnosis of URETHROCUTANEOUS FISTULA  The various methods of treatment have been discussed with the patient and family. After consideration of risks, benefits and other options for treatment, the patient has consented to  Procedure(s): CYSTOSCOPY/RETROGRADE URETHROGRAM  Karen Kays AND PERINEUM IRRIGATION AND DEBRIDEMENT (Bilateral) as a surgical intervention .  The patient's history has been reviewed, patient examined, no change in status, stable for surgery.  I have reviewed the patient's chart and labs.  Questions were answered to the patient's satisfaction.     Crist Fat

## 2018-08-15 NOTE — Transfer of Care (Signed)
Immediate Anesthesia Transfer of Care Note  Patient: Gregory Camacho  Procedure(s) Performed: CYSTOSCOPY/RETROGRADE URETHROGRAM  Karen Kays AND PERINEUM IRRIGATION AND DEBRIDEMENT (Bilateral )  Patient Location: PACU  Anesthesia Type:General  Level of Consciousness: awake, alert  and oriented  Airway & Oxygen Therapy: Patient Spontanous Breathing and Patient connected to face mask oxygen  Post-op Assessment: Report given to RN and Post -op Vital signs reviewed and stable  Post vital signs: Reviewed and stable  Last Vitals:  Vitals Value Taken Time  BP 119/75 08/15/2018  8:40 AM  Temp    Pulse 70 08/15/2018  8:41 AM  Resp 18 08/15/2018  8:41 AM  SpO2 100 % 08/15/2018  8:41 AM  Vitals shown include unvalidated device data.  Last Pain:  Vitals:   08/15/18 0601  TempSrc: Oral         Complications: No apparent anesthesia complications

## 2018-08-15 NOTE — Anesthesia Postprocedure Evaluation (Signed)
Anesthesia Post Note  Patient: Gregory Camacho  Procedure(s) Performed: CYSTOSCOPY/RETROGRADE URETHROGRAM  Karen Kays AND PERINEUM IRRIGATION AND DEBRIDEMENT (Bilateral )     Patient location during evaluation: PACU Anesthesia Type: General Level of consciousness: awake and alert Pain management: pain level controlled Vital Signs Assessment: post-procedure vital signs reviewed and stable Respiratory status: spontaneous breathing, nonlabored ventilation, respiratory function stable and patient connected to nasal cannula oxygen Cardiovascular status: blood pressure returned to baseline and stable Postop Assessment: no apparent nausea or vomiting Anesthetic complications: no    Last Vitals:  Vitals:   08/15/18 0930 08/15/18 1039  BP: 104/68 110/72  Pulse: (!) 57 84  Resp: 13 14  Temp: 36.6 C 36.4 C  SpO2: 99% 99%    Last Pain:  Vitals:   08/15/18 1039  TempSrc:   PainSc: 2                  Alexandera Kuntzman

## 2018-08-16 ENCOUNTER — Encounter (HOSPITAL_COMMUNITY): Payer: Self-pay | Admitting: Urology

## 2019-02-12 ENCOUNTER — Other Ambulatory Visit: Payer: Self-pay | Admitting: Urology

## 2019-02-12 MED ORDER — SULFAMETHOXAZOLE-TRIMETHOPRIM 800-160 MG PO TABS: 1.0000 | ORAL_TABLET | Freq: Two times a day (BID) | ORAL | 0 refills | Status: AC

## 2019-02-12 NOTE — Progress Notes (Signed)
Bactrim sent in

## 2021-02-25 ENCOUNTER — Emergency Department (HOSPITAL_COMMUNITY): Payer: Medicaid Other

## 2021-02-25 ENCOUNTER — Inpatient Hospital Stay (HOSPITAL_COMMUNITY)
Admission: EM | Admit: 2021-02-25 | Discharge: 2021-02-28 | DRG: 195 | Disposition: A | Discharge status: Home / Self Care | Payer: Medicaid Other | Attending: Internal Medicine | Admitting: Internal Medicine

## 2021-02-25 ENCOUNTER — Encounter (HOSPITAL_COMMUNITY): Payer: Self-pay | Admitting: Emergency Medicine

## 2021-02-25 DIAGNOSIS — Z20822 Contact with and (suspected) exposure to covid-19: Secondary | ICD-10-CM | POA: Diagnosis present

## 2021-02-25 DIAGNOSIS — F320 Major depressive disorder, single episode, mild: Secondary | ICD-10-CM | POA: Diagnosis not present

## 2021-02-25 DIAGNOSIS — F17210 Nicotine dependence, cigarettes, uncomplicated: Secondary | ICD-10-CM | POA: Diagnosis present

## 2021-02-25 DIAGNOSIS — F1210 Cannabis abuse, uncomplicated: Secondary | ICD-10-CM | POA: Diagnosis present

## 2021-02-25 DIAGNOSIS — J189 Pneumonia, unspecified organism: Secondary | ICD-10-CM | POA: Diagnosis not present

## 2021-02-25 DIAGNOSIS — F1410 Cocaine abuse, uncomplicated: Secondary | ICD-10-CM | POA: Diagnosis present

## 2021-02-25 DIAGNOSIS — F32A Depression, unspecified: Secondary | ICD-10-CM | POA: Diagnosis present

## 2021-02-25 LAB — LACTIC ACID, PLASMA
Lactic Acid, Venous: 1.5 mmol/L (ref 0.5–1.9)
Lactic Acid, Venous: 1.9 mmol/L (ref 0.5–1.9)
Lactic Acid, Venous: 1.9 mmol/L (ref 0.5–1.9)
Lactic Acid, Venous: 1.3 mmol/L (ref 0.5–1.9)

## 2021-02-25 LAB — BASIC METABOLIC PANEL
Anion gap: 12 (ref 5–15)
BUN: 25 mg/dL — ABNORMAL HIGH (ref 8–23)
CO2: 24 mmol/L (ref 22–32)
Calcium: 8.6 mg/dL — ABNORMAL LOW (ref 8.9–10.3)
Chloride: 97 mmol/L — ABNORMAL LOW (ref 98–111)
Creatinine, Ser: 1.15 mg/dL (ref 0.61–1.24)
GFR, Estimated: >60 mL/min (ref >60)
Glucose, Bld: 164 mg/dL — ABNORMAL HIGH (ref 70–99)
Potassium: 3.8 mmol/L (ref 3.5–5.1)
Sodium: 133 mmol/L — ABNORMAL LOW (ref 135–145)

## 2021-02-25 LAB — URINALYSIS, ROUTINE W REFLEX MICROSCOPIC
Bilirubin Urine: NEGATIVE
Glucose, UA: NEGATIVE mg/dL
Ketones, ur: NEGATIVE mg/dL
Nitrite: POSITIVE — AB
Protein, ur: 100 mg/dL — AB
Specific Gravity, Urine: >1.03 — ABNORMAL HIGH (ref 1.005–1.030)
pH: 5.5 (ref 5.0–8.0)

## 2021-02-25 LAB — CBC WITH DIFFERENTIAL/PLATELET
Band Neutrophils: 1 %
Basophils Absolute: 0 10*3/uL (ref 0.0–0.1)
Basophils Relative: 0 %
Eosinophils Absolute: 0 10*3/uL (ref 0.0–0.5)
Eosinophils Relative: 0 %
HCT: 38.7 % — ABNORMAL LOW (ref 39.0–52.0)
Hemoglobin: 12.4 g/dL — ABNORMAL LOW (ref 13.0–17.0)
Lymphocytes Relative: 1 %
Lymphs Abs: 0.3 10*3/uL — ABNORMAL LOW (ref 0.7–4.0)
MCH: 27.4 pg (ref 26.0–34.0)
MCHC: 32 g/dL (ref 30.0–36.0)
MCV: 85.6 fL (ref 80.0–100.0)
Metamyelocytes Relative: 5 %
Monocytes Absolute: 2.4 10*3/uL — ABNORMAL HIGH (ref 0.1–1.0)
Monocytes Relative: 9 %
Neutro Abs: 22.5 10*3/uL — ABNORMAL HIGH (ref 1.7–7.7)
Neutrophils Relative %: 84 %
Platelets: 261 10*3/uL (ref 150–400)
RBC: 4.52 MIL/uL (ref 4.22–5.81)
RDW: 14.5 % (ref 11.5–15.5)
WBC: 26.5 10*3/uL — ABNORMAL HIGH (ref 4.0–10.5)
nRBC: 0 % (ref 0.0–0.2)

## 2021-02-25 LAB — RAPID URINE DRUG SCREEN, HOSP PERFORMED
Amphetamines: NOT DETECTED
Barbiturates: NOT DETECTED
Benzodiazepines: NOT DETECTED
Cocaine: POSITIVE — AB
Opiates: NOT DETECTED
Tetrahydrocannabinol: POSITIVE — AB

## 2021-02-25 LAB — PROCALCITONIN: Procalcitonin: 20.47 ng/mL

## 2021-02-25 LAB — HIV ANTIBODY (ROUTINE TESTING W REFLEX): HIV Screen 4th Generation wRfx: NONREACTIVE

## 2021-02-25 LAB — TROPONIN I (HIGH SENSITIVITY)
Troponin I (High Sensitivity): 12 ng/L (ref <18)
Troponin I (High Sensitivity): 11 ng/L (ref <18)

## 2021-02-25 LAB — URINALYSIS, MICROSCOPIC (REFLEX)

## 2021-02-25 LAB — RESP PANEL BY RT-PCR (FLU A&B, COVID) ARPGX2
Influenza A by PCR: NEGATIVE
Influenza B by PCR: NEGATIVE
SARS Coronavirus 2 by RT PCR: NEGATIVE

## 2021-02-25 MED ORDER — METHOCARBAMOL 1000 MG/10ML IJ SOLN
500.0000 mg | Freq: Four times a day (QID) | INTRAVENOUS | Status: DC | PRN
  Filled 2021-02-25: qty 5

## 2021-02-25 MED ORDER — ZOLPIDEM TARTRATE 5 MG PO TABS: 5.0000 mg | ORAL_TABLET | Freq: Every evening | ORAL | Status: DC | PRN

## 2021-02-25 MED ORDER — SORBITOL 70 % SOLN
30.0000 mL | Freq: Every day | Status: DC | PRN
  Filled 2021-02-25: qty 30

## 2021-02-25 MED ORDER — SODIUM CHLORIDE 0.9 % IV SOLN
500.0000 mg | INTRAVENOUS | Status: DC
  Administered 2021-02-25 – 2021-02-28 (×4): 500 mg via INTRAVENOUS
  Filled 2021-02-25 (×4): qty 500

## 2021-02-25 MED ORDER — ENOXAPARIN SODIUM 40 MG/0.4ML IJ SOSY
40.0000 mg | PREFILLED_SYRINGE | INTRAMUSCULAR | Status: DC
  Administered 2021-02-25 – 2021-02-27 (×3): 40 mg via SUBCUTANEOUS
  Filled 2021-02-25 (×4): qty 0.4

## 2021-02-25 MED ORDER — IPRATROPIUM-ALBUTEROL 0.5-2.5 (3) MG/3ML IN SOLN: 3.0000 mL | Freq: Four times a day (QID) | RESPIRATORY_TRACT | Status: DC | PRN

## 2021-02-25 MED ORDER — NICOTINE 21 MG/24HR TD PT24
21.0000 mg | MEDICATED_PATCH | Freq: Every day | TRANSDERMAL | Status: DC
  Administered 2021-02-25 – 2021-02-28 (×4): 21 mg via TRANSDERMAL
  Filled 2021-02-25 (×4): qty 1

## 2021-02-25 MED ORDER — IPRATROPIUM-ALBUTEROL 0.5-2.5 (3) MG/3ML IN SOLN
3.0000 mL | Freq: Four times a day (QID) | RESPIRATORY_TRACT | Status: DC
  Administered 2021-02-25: 3 mL via RESPIRATORY_TRACT
  Filled 2021-02-25: qty 3

## 2021-02-25 MED ORDER — OXYCODONE HCL 5 MG PO TABS: 5.0000 mg | ORAL_TABLET | ORAL | Status: DC | PRN

## 2021-02-25 MED ORDER — ACETAMINOPHEN 325 MG PO TABS: 650.0000 mg | ORAL_TABLET | Freq: Four times a day (QID) | ORAL | Status: DC | PRN

## 2021-02-25 MED ORDER — SODIUM CHLORIDE 0.9 % IV BOLUS
1000.0000 mL | Freq: Once | INTRAVENOUS | Status: AC
  Administered 2021-02-25: 1000 mL via INTRAVENOUS

## 2021-02-25 MED ORDER — ONDANSETRON HCL 4 MG/2ML IJ SOLN
4.0000 mg | Freq: Four times a day (QID) | INTRAMUSCULAR | Status: DC | PRN
  Administered 2021-02-25: 4 mg via INTRAVENOUS

## 2021-02-25 MED ORDER — SODIUM CHLORIDE 0.9 % IV SOLN
1.0000 g | Freq: Once | INTRAVENOUS | Status: AC
  Administered 2021-02-25: 1 g via INTRAVENOUS
  Filled 2021-02-25: qty 10

## 2021-02-25 MED ORDER — METHYLPREDNISOLONE SODIUM SUCC 125 MG IJ SOLR
60.0000 mg | Freq: Two times a day (BID) | INTRAMUSCULAR | Status: DC
  Administered 2021-02-25 – 2021-02-28 (×6): 60 mg via INTRAVENOUS
  Filled 2021-02-25 (×7): qty 2

## 2021-02-25 MED ORDER — ACETAMINOPHEN 325 MG PO TABS
650.0000 mg | ORAL_TABLET | Freq: Once | ORAL | Status: AC
  Administered 2021-02-25: 650 mg via ORAL
  Filled 2021-02-25: qty 2

## 2021-02-25 MED ORDER — SODIUM CHLORIDE 0.9 % IV SOLN: INTRAVENOUS | Status: DC

## 2021-02-25 MED ORDER — SODIUM CHLORIDE 0.9 % IV SOLN
25.0000 mg | Freq: Once | INTRAVENOUS | Status: AC
  Administered 2021-02-25: 25 mg via INTRAVENOUS
  Filled 2021-02-25: qty 1

## 2021-02-25 MED ORDER — ONDANSETRON HCL 4 MG/2ML IJ SOLN
4.0000 mg | Freq: Once | INTRAMUSCULAR | Status: DC
Start: 2021-02-25 — End: 2021-02-28
  Filled 2021-02-25: qty 2

## 2021-02-25 MED ORDER — ONDANSETRON HCL 4 MG PO TABS: 4.0000 mg | ORAL_TABLET | Freq: Four times a day (QID) | ORAL | Status: DC | PRN

## 2021-02-25 MED ORDER — ACETAMINOPHEN 650 MG RE SUPP: 650.0000 mg | Freq: Four times a day (QID) | RECTAL | Status: DC | PRN

## 2021-02-25 MED ORDER — ASPIRIN EC 81 MG PO TBEC
81.0000 mg | DELAYED_RELEASE_TABLET | Freq: Every day | ORAL | Status: DC
  Administered 2021-02-25 – 2021-02-28 (×4): 81 mg via ORAL
  Filled 2021-02-25 (×4): qty 1

## 2021-02-25 MED ORDER — IBUPROFEN 400 MG PO TABS
600.0000 mg | ORAL_TABLET | Freq: Once | ORAL | Status: AC
  Administered 2021-02-25: 600 mg via ORAL
  Filled 2021-02-25: qty 2

## 2021-02-25 NOTE — ED Notes (Signed)
Pt continues to vomit, crackers given to try and ease episodes.

## 2021-02-25 NOTE — ED Provider Notes (Signed)
[facility] EMERGENCY DEPARTMENT Provider Note   CSN: [id number] Arrival date & time: 02/25/21  8366     History Chief Complaint  Patient presents with   Fever    Gregory Camacho is a 60 y.o. male.  Patient presents with complaint of body aches chills, chest pain, cough.  Symptoms ongoing since yesterday.  Denies diarrhea, +1 episode of vomiting nonbloody.  Patient otherwise vaccinated for COVID at baseline.      Past Medical History:  Diagnosis Date   Depression    Sinus congestion     There are no problems to display for this patient.   Past Surgical History:  Procedure Laterality Date   CYSTOSCOPY/RETROGRADE/URETEROSCOPY Bilateral 08/15/2018   Procedure: CYSTOSCOPY/RETROGRADE URETHROGRAM  Karen Kays AND PERINEUM IRRIGATION AND DEBRIDEMENT;  Surgeon: Crist Fat, MD;  Location: [facility] ORS;  Service: Urology;  Laterality: Bilateral;       History reviewed. No pertinent family history.  Social History   Tobacco Use   Smoking status: Every Day    Packs/day: 1.00    Years: 30.00    Pack years: 30.00    Types: Cigarettes   Smokeless tobacco: Never  Vaping Use   Vaping Use: Never used  Substance Use Topics   Alcohol use: Yes    Alcohol/week: 2.0 standard drinks    Types: 1 Cans of beer, 1 Shots of liquor per week    Comment: occa   Drug use: Yes    Types: Marijuana    Comment: occassionally (last 05/23/18)    Home Medications Prior to Admission medications   Medication Sig Start Date End Date Taking? Authorizing Provider  ciprofloxacin (CIPRO) 500 MG tablet Take 1 tablet (500 mg total) by mouth 2 (two) times daily. Patient not taking: Reported on 07/31/2018 05/27/18   Blane Ohara, MD    Allergies    Patient has no known allergies.  Review of Systems   Review of Systems  Constitutional:  Positive for chills. Negative for fever.  HENT:  Negative for ear pain and sore throat.   Eyes:  Negative for pain.  Respiratory:  Positive for cough.    Cardiovascular:  Positive for chest pain.  Gastrointestinal:  Negative for abdominal pain.  Genitourinary:  Negative for flank pain.  Musculoskeletal:  Negative for back pain.  Skin:  Negative for color change and rash.  Neurological:  Negative for syncope.  All other systems reviewed and are negative.  Physical Exam Updated Vital Signs BP 92/62   Pulse 81   Temp 99.3 F (37.4 C) (Oral)   Resp (!) 27   Ht 5\' 7"  (1.702 m)   Wt 56.7 kg   SpO2 97%   BMI 19.58 kg/m   Physical Exam Constitutional:      Appearance: He is well-developed.  HENT:     Head: Normocephalic.     Nose: Nose normal.  Eyes:     Extraocular Movements: Extraocular movements intact.  Cardiovascular:     Rate and Rhythm: Normal rate.  Pulmonary:     Effort: Pulmonary effort is normal.  Skin:    Coloration: Skin is not jaundiced.  Neurological:     Mental Status: He is alert. Mental status is at baseline.    ED Results / Procedures / Treatments   Labs (all labs ordered are listed, but only abnormal results are displayed) Labs Reviewed  BASIC METABOLIC PANEL - Abnormal; Notable for the following components:      Result Value   Sodium 133 (*)  Chloride 97 (*)    Glucose, Bld 164 (*)    BUN 25 (*)    Calcium 8.6 (*)    All other components within normal limits  CBC WITH DIFFERENTIAL/PLATELET - Abnormal; Notable for the following components:   WBC 26.5 (*)    Hemoglobin 12.4 (*)    HCT 38.7 (*)    Neutro Abs 22.5 (*)    Lymphs Abs 0.3 (*)    Monocytes Absolute 2.4 (*)    All other components within normal limits  RESP PANEL BY RT-PCR (FLU A&B, COVID) ARPGX2  CULTURE, BLOOD (ROUTINE X 2)  CULTURE, BLOOD (ROUTINE X 2)  URINALYSIS, ROUTINE W REFLEX MICROSCOPIC  LACTIC ACID, PLASMA  LACTIC ACID, PLASMA  TROPONIN I (HIGH SENSITIVITY)  TROPONIN I (HIGH SENSITIVITY)    EKG None  Radiology DG Chest Port 1 View  Result Date: 02/25/2021 CLINICAL DATA:  Fever. EXAM: PORTABLE CHEST 1 VIEW  COMPARISON:  07/20/2004 FINDINGS: 0805 hours. Focal airspace consolidation noted right mid lung. Left lung clear. The cardiopericardial silhouette is within normal limits for size. The visualized bony structures of the thorax show no acute abnormality. Telemetry leads overlie the chest. IMPRESSION: Focal airspace consolidation right mid lung compatible with pneumonia. Follow-up imaging recommended to ensure resolution. Electronically Signed   By: Kennith Center M.D.   On: 02/25/2021 08:21    Procedures Procedures   Medications Ordered in ED Medications  cefTRIAXone (ROCEPHIN) 1 g in sodium chloride 0.9 % 100 mL IVPB (has no administration in time range)  azithromycin (ZITHROMAX) 500 mg in sodium chloride 0.9 % 250 mL IVPB (has no administration in time range)  sodium chloride 0.9 % bolus 1,000 mL (has no administration in time range)  acetaminophen (TYLENOL) tablet 650 mg (650 mg Oral Given 02/25/21 0802)  ibuprofen (ADVIL) tablet 600 mg (600 mg Oral Given 02/25/21 0801)    ED Course  I have reviewed the triage vital signs and the nursing notes.  Pertinent labs & imaging results that were available during my care of the patient were reviewed by me and considered in my medical decision making (see chart for details).    MDM Rules/Calculators/A&P                           Patient's blood pressure is mildly soft.  Ranging from 95-100 systolic.  Vital signs within normal limits mildly tachypneic.  O2 saturation within 95% on room air which is appropriate. Labs show elevated white count 26, chest x-ray concerning for pneumonia.  Patient had blood culture sent, started on Rocephin and azithromycin.  Will be brought into the hospitalist team.  Final Clinical Impression(s) / ED Diagnoses Final diagnoses:  Community acquired pneumonia, unspecified laterality    Rx / DC Orders ED Discharge Orders     None        Cheryll Cockayne, MD 02/25/21 1013

## 2021-02-25 NOTE — ED Triage Notes (Signed)
Pt c/o body aches, chills, and nausea that started yesterday.

## 2021-02-25 NOTE — ED Notes (Signed)
Pt vomited medium amount. Dr made aware.

## 2021-02-25 NOTE — H&P (Signed)
[facility] History and Physical  Gregory Camacho YQM:[id number] DOB: 03/18/1961 DOA: 02/25/2021  Referring physician:  PCP: Patient, No Pcp Per (Inactive)   Chief Complaint: Body aches, chest pain, cough  HPI: Gregory Camacho is a 60 y.o. BM PMHx depression, sinus congestion.  Presents with complaint of body aches chills, chest pain, cough.  Symptoms ongoing since yesterday.  Denies diarrhea, +1 episode of vomiting nonbloody.  Patient otherwise vaccinated for COVID at baseline.   Review of Systems:  Covid vaccination;  Constitutional:  No weight loss, night sweats, Fevers, chills, fatigue.  HEENT:  No headaches, Difficulty swallowing,Tooth/dental problems,Sore throat,  No sneezing, itching, ear ache, nasal congestion, post nasal drip,  Cardio-vascular:  No chest pain, Orthopnea, PND, swelling in lower extremities, anasarca, dizziness, palpitations  GI:  No heartburn, indigestion, abdominal pain, nausea, vomiting, diarrhea, change in bowel habits, loss of appetite  Resp:  No shortness of breath with exertion or at rest. No excess mucus, no productive cough, No non-productive cough, No coughing up of blood.No change in color of mucus.No wheezing.No chest wall deformity  Skin:  no rash or lesions.  GU:  no dysuria, change in color of urine, no urgency or frequency. No flank pain.  Musculoskeletal:  No joint pain or swelling. No decreased range of motion. No back pain.  Psych:  No change in mood or affect. No depression or anxiety. No memory loss.   Past Medical History:  Diagnosis Date   Depression    Sinus congestion    Past Surgical History:  Procedure Laterality Date   CYSTOSCOPY/RETROGRADE/URETEROSCOPY Bilateral 08/15/2018   Procedure: CYSTOSCOPY/RETROGRADE URETHROGRAM  Karen Kays AND PERINEUM IRRIGATION AND DEBRIDEMENT;  Surgeon: Crist Fat, MD;  Location: [facility];  Service: Urology;  Laterality: Bilateral;   Social History:  reports that he has been  smoking cigarettes. He has a 30.00 pack-year smoking history. He has never used smokeless tobacco. He reports current alcohol use of about 2.0 standard drinks per week. He reports current drug use. Drug: Marijuana.  No Known Allergies  Unable to review FMHx secondary to patient patient's somnolence  Prior to Admission medications   Medication Sig Start Date End Date Taking? Authorizing Provider  ciprofloxacin (CIPRO) 500 MG tablet Take 1 tablet (500 mg total) by mouth 2 (two) times daily. Patient not taking: No sig reported 05/27/18   Blane Ohara, MD     Consultants:    Procedures/Significant Events:    I have personally reviewed and interpreted all radiology studies and my findings are as above.   VENTILATOR SETTINGS:    Cultures 9/2 respiratory virus panel pending 9/2 sputum pending  Antimicrobials: Anti-infectives (From admission, onward)    Start     Dose/Rate Route Frequency Ordered Stop   02/25/21 0915  cefTRIAXone (ROCEPHIN) 1 g in sodium chloride 0.9 % 100 mL IVPB        1 g 200 mL/hr over 30 Minutes Intravenous  Once 02/25/21 0905 02/25/21 1214   02/25/21 0915  azithromycin (ZITHROMAX) 500 mg in sodium chloride 0.9 % 250 mL IVPB        500 mg 250 mL/hr over 60 Minutes Intravenous Every 24 hours 02/25/21 0905           Devices    LINES / TUBES:      Continuous Infusions:  sodium chloride 100 mL/hr at 02/25/21 1212   azithromycin Stopped (02/25/21 1331)   methocarbamol (ROBAXIN) IV      Physical Exam: Vitals:   02/25/21 1600 02/25/21  1630 02/25/21 1700 02/25/21 1730  BP: 131/73 116/62 (!) 108/59 119/62  Pulse: 73 78 79   Resp: 19 16 17  (!) 21  Temp:      TempSrc:      SpO2: 100% 98% 95% 94%  Weight:      Height:        Wt Readings from Last 3 Encounters:  02/25/21 56.7 kg  08/07/18 59.1 kg  05/27/18 59 kg    General: Patient sleeping peacefully, No acute respiratory distress Eyes: negative scleral hemorrhage, negative anisocoria,  negative icterus ENT: Negative Runny nose, negative gingival bleeding, Neck:  Negative scars, masses, torticollis, lymphadenopathy, JVD Lungs: decreased breath sounds bilaterally without wheezes or crackles, breath sounds coarse Cardiovascular: Regular rate and rhythm without murmur gallop or rub normal S1 and S2 Abdomen: negative abdominal pain, nondistended, positive soft, bowel sounds, no rebound, no ascites, no appreciable mass Extremities: No significant cyanosis, clubbing, or edema bilateral lower extremities Skin: Negative rashes, lesions, ulcers Psychiatric: Unable to evaluate secondary to patient somnolence Central nervous system: Withdraws to painful stimuli        Labs on Admission:  Basic Metabolic Panel: Recent Labs  Lab 02/25/21 0808  NA 133*  K 3.8  CL 97*  CO2 24  GLUCOSE 164*  BUN 25*  CREATININE 1.15  CALCIUM 8.6*   Liver Function Tests: No results for input(s): AST, ALT, ALKPHOS, BILITOT, PROT, ALBUMIN in the last 168 hours. No results for input(s): LIPASE, AMYLASE in the last 168 hours. No results for input(s): AMMONIA in the last 168 hours. CBC: Recent Labs  Lab 02/25/21 0808  WBC 26.5*  NEUTROABS 22.5*  HGB 12.4*  HCT 38.7*  MCV 85.6  PLT 261   Cardiac Enzymes: No results for input(s): CKTOTAL, CKMB, CKMBINDEX, TROPONINI in the last 168 hours.  BNP (last 3 results) No results for input(s): BNP in the last 8760 hours.  ProBNP (last 3 results) No results for input(s): PROBNP in the last 8760 hours.  CBG: No results for input(s): GLUCAP in the last 168 hours.  Radiological Exams on Admission: DG Chest Port 1 View  Result Date: 02/25/2021 CLINICAL DATA:  Fever. EXAM: PORTABLE CHEST 1 VIEW COMPARISON:  07/20/2004 FINDINGS: 0805 hours. Focal airspace consolidation noted right mid lung. Left lung clear. The cardiopericardial silhouette is within normal limits for size. The visualized bony structures of the thorax show no acute abnormality.  Telemetry leads overlie the chest. IMPRESSION: Focal airspace consolidation right mid lung compatible with pneumonia. Follow-up imaging recommended to ensure resolution. Electronically Signed   By: 07/22/2004 M.D.   On: 02/25/2021 08:21    EKG: Independently reviewed.   Assessment/Plan Active Problems:   Depression   Pneumonia  Pneumonia - Continue current antibiotics course 5 to 7 days.  Will adjust antibiotics if we obtain positive sputum culture. - DuoNeb QID -Solu-Medrol 60 mg BID - Expiratory spirometry - Flutter valve  Depression -Not on home meds  Nicotine abuse - Nicotine patch  Drug abuse? - Urine rapid drug screen pending      Code Status: Full (DVT Prophylaxis: Lovenox Family Communication:   Status is: Inpatient    Dispo: The patient is from: Home              Anticipated d/c is to: Home              Anticipated d/c date is: 3 days              Patient currently is not  medically stable to d/c.     Data Reviewed: Care during the described time interval was provided by me .  I have reviewed this patient's available data, including medical history, events of note, physical examination, and all test results as part of my evaluation.   The patient is critically ill with multiple organ systems failure and requires high complexity decision making for assessment and support, frequent evaluation and titration of therapies, application of advanced monitoring technologies and extensive interpretation of multiple databases. Critical Care Time devoted to patient care services described in this note  Time spent: 70 minutes   Ajla Mcgeachy J [facility]

## 2021-02-25 NOTE — ED Notes (Signed)
Patient advised again we needed urine specimen.

## 2021-02-25 NOTE — ED Notes (Signed)
Attempted to call report but no answer.

## 2021-02-25 NOTE — ED Notes (Signed)
Patient sleeping.  Woke patient and advised that we still needed urine specimen.  Patient rolled over and went back to sleep.

## 2021-02-25 NOTE — Progress Notes (Signed)
Pt received to room 326 via stretcher from ED with dx of Pneumonia.  Telemetry initiated.  Oriented to room and call bell.  Informed of need for urine specimen and pt voiced understanding - urinal at bedside. Gregory Camacho BSN RN CMSRN

## 2021-02-25 NOTE — ED Notes (Signed)
tPt continues to vomit.

## 2021-02-26 ENCOUNTER — Other Ambulatory Visit: Payer: Self-pay

## 2021-02-26 DIAGNOSIS — F1410 Cocaine abuse, uncomplicated: Secondary | ICD-10-CM | POA: Diagnosis present

## 2021-02-26 DIAGNOSIS — F1210 Cannabis abuse, uncomplicated: Secondary | ICD-10-CM

## 2021-02-26 DIAGNOSIS — J189 Pneumonia, unspecified organism: Secondary | ICD-10-CM | POA: Diagnosis present

## 2021-02-26 DIAGNOSIS — F320 Major depressive disorder, single episode, mild: Secondary | ICD-10-CM | POA: Diagnosis not present

## 2021-02-26 DIAGNOSIS — F17210 Nicotine dependence, cigarettes, uncomplicated: Secondary | ICD-10-CM | POA: Diagnosis present

## 2021-02-26 DIAGNOSIS — Z20822 Contact with and (suspected) exposure to covid-19: Secondary | ICD-10-CM | POA: Diagnosis present

## 2021-02-26 LAB — COMPREHENSIVE METABOLIC PANEL
ALT: 34 U/L (ref 0–44)
AST: 40 U/L (ref 15–41)
Albumin: 2.8 g/dL — ABNORMAL LOW (ref 3.5–5.0)
Alkaline Phosphatase: 76 U/L (ref 38–126)
Anion gap: 7 (ref 5–15)
BUN: 24 mg/dL — ABNORMAL HIGH (ref 8–23)
CO2: 28 mmol/L (ref 22–32)
Calcium: 8.2 mg/dL — ABNORMAL LOW (ref 8.9–10.3)
Chloride: 103 mmol/L (ref 98–111)
Creatinine, Ser: 0.87 mg/dL (ref 0.61–1.24)
GFR, Estimated: >60 mL/min (ref >60)
Glucose, Bld: 184 mg/dL — ABNORMAL HIGH (ref 70–99)
Potassium: 3.7 mmol/L (ref 3.5–5.1)
Sodium: 138 mmol/L (ref 135–145)
Total Bilirubin: 0.3 mg/dL (ref 0.3–1.2)
Total Protein: 7.2 g/dL (ref 6.5–8.1)

## 2021-02-26 LAB — CBC WITH DIFFERENTIAL/PLATELET
Abs Immature Granulocytes: 0.75 10*3/uL — ABNORMAL HIGH (ref 0.00–0.07)
Basophils Absolute: 0.1 10*3/uL (ref 0.0–0.1)
Basophils Relative: 0 %
Eosinophils Absolute: 0.1 10*3/uL (ref 0.0–0.5)
Eosinophils Relative: 0 %
HCT: 36.3 % — ABNORMAL LOW (ref 39.0–52.0)
Hemoglobin: 11.6 g/dL — ABNORMAL LOW (ref 13.0–17.0)
Immature Granulocytes: 2 %
Lymphocytes Relative: 2 %
Lymphs Abs: 0.7 10*3/uL (ref 0.7–4.0)
MCH: 27.4 pg (ref 26.0–34.0)
MCHC: 32 g/dL (ref 30.0–36.0)
MCV: 85.6 fL (ref 80.0–100.0)
Monocytes Absolute: 1.2 10*3/uL — ABNORMAL HIGH (ref 0.1–1.0)
Monocytes Relative: 4 %
Neutro Abs: 27.9 10*3/uL — ABNORMAL HIGH (ref 1.7–7.7)
Neutrophils Relative %: 92 %
Platelets: 291 10*3/uL (ref 150–400)
RBC: 4.24 MIL/uL (ref 4.22–5.81)
RDW: 14.6 % (ref 11.5–15.5)
WBC: 30.7 10*3/uL — ABNORMAL HIGH (ref 4.0–10.5)
nRBC: 0 % (ref 0.0–0.2)

## 2021-02-26 LAB — STREP PNEUMONIAE URINARY ANTIGEN: Strep Pneumo Urinary Antigen: NEGATIVE

## 2021-02-26 LAB — PROCALCITONIN: Procalcitonin: 10.61 ng/mL

## 2021-02-26 LAB — PHOSPHORUS: Phosphorus: 2.2 mg/dL — ABNORMAL LOW (ref 2.5–4.6)

## 2021-02-26 LAB — MAGNESIUM: Magnesium: 2.8 mg/dL — ABNORMAL HIGH (ref 1.7–2.4)

## 2021-02-26 MED ORDER — LIDOCAINE VISCOUS HCL 2 % MT SOLN
15.0000 mL | Freq: Once | OROMUCOSAL | Status: AC
  Administered 2021-02-26: 15 mL via ORAL
  Filled 2021-02-26: qty 15

## 2021-02-26 MED ORDER — K PHOS MONO-SOD PHOS DI & MONO 155-852-130 MG PO TABS
500.0000 mg | ORAL_TABLET | Freq: Three times a day (TID) | ORAL | Status: AC
  Administered 2021-02-26 – 2021-02-27 (×3): 500 mg via ORAL
  Filled 2021-02-26 (×3): qty 2

## 2021-02-26 MED ORDER — FAMOTIDINE 20 MG PO TABS
20.0000 mg | ORAL_TABLET | Freq: Two times a day (BID) | ORAL | Status: DC
  Administered 2021-02-26 – 2021-02-28 (×4): 20 mg via ORAL
  Filled 2021-02-26 (×4): qty 1

## 2021-02-26 MED ORDER — ALUM & MAG HYDROXIDE-SIMETH 200-200-20 MG/5ML PO SUSP
30.0000 mL | Freq: Once | ORAL | Status: AC
  Administered 2021-02-26: 30 mL via ORAL
  Filled 2021-02-26: qty 30

## 2021-02-26 NOTE — Progress Notes (Signed)
[facility] night shift telemetry coverage note.  The nursing staff communicated that the patient was complaining of heartburn and requested something to treat it.  Maalox with viscous lidocaine given.  Started on famotidine 20 mg p.o. twice daily.  Sanda Klein, MD.

## 2021-02-26 NOTE — Progress Notes (Signed)
PROGRESS NOTE    Gregory Camacho  FWY:[id number] DOB: 02-Apr-1961 DOA: 02/25/2021 PCP: Patient, No Pcp Per (Inactive)   Brief Narrative:   Gregory Camacho is a 60 y.o. BM PMHx depression, sinus congestion.  Drug abuse   Presents with complaint of body aches chills, chest pain, cough.  Symptoms ongoing since yesterday.  Denies diarrhea, +1 episode of vomiting nonbloody.  Patient otherwise vaccinated for COVID at baseline.    Subjective: A/O x4, afebrile overnight.  States feels significantly improved overnight     Assessment & Plan:  Covid vaccination; vaccinated 2/4   Active Problems:   Depression   Pneumonia   Drug abuse, marijuana   Drug abuse, cocaine type (HCC)   Pneumonia - Continue current antibiotics course 5 to 7 days.  Will adjust antibiotics if we obtain positive sputum culture. - DuoNeb QID -Solu-Medrol 60 mg BID - Expiratory spirometry - Flutter valve   Depression -Not on home meds   Nicotine abuse - Nicotine patch   Drug abuse - 9/2 urine rapid drug screen positive cocaine and marijuana -9/3 Counseling of resources available to stop use of marijuana and cocaine  DVT prophylaxis: Lovenox Code Status: Full Family Communication:  Status is: Inpatient    Dispo: The patient is from: Home              Anticipated d/c is to: Home              Anticipated d/c date is: 2 days              Patient currently is not medically stable to d/c.      Consultants:    Procedures/Significant Events:     I have personally reviewed and interpreted all radiology studies and my findings are as above.  VENTILATOR SETTINGS:    Cultures   Antimicrobials: Anti-infectives (From admission, onward)    Start     Dose/Rate Route Frequency Ordered Stop   02/25/21 0915  cefTRIAXone (ROCEPHIN) 1 g in sodium chloride 0.9 % 100 mL IVPB        1 g 200 mL/hr over 30 Minutes Intravenous  Once 02/25/21 0905 02/25/21 1214   02/25/21 0915  azithromycin (ZITHROMAX)  500 mg in sodium chloride 0.9 % 250 mL IVPB        500 mg 250 mL/hr over 60 Minutes Intravenous Every 24 hours 02/25/21 0905           Devices    LINES / TUBES:      Continuous Infusions:  sodium chloride 100 mL/hr at 02/26/21 0511   azithromycin 500 mg (02/26/21 0822)   methocarbamol (ROBAXIN) IV       Objective: Vitals:   02/25/21 1944 02/25/21 2018 02/25/21 2330 02/26/21 0344  BP: 110/71  103/71 110/71  Pulse: 79  78 71  Resp: 18  19 19   Temp: 98.3 F (36.8 C)  98.5 F (36.9 C) 98.9 F (37.2 C)  TempSrc: Oral  Oral Oral  SpO2: 97% 96% 97% 99%  Weight: 53.5 kg     Height: 5\' 7"  (1.702 m)       Intake/Output Summary (Last 24 hours) at 02/26/2021 1228 Last data filed at 02/26/2021 0900 Gross per 24 hour  Intake 4134.34 ml  Output 450 ml  Net 3684.34 ml   Filed Weights   02/25/21 0631 02/25/21 1944  Weight: 56.7 kg 53.5 kg    Examination:  General: A/O x4 No acute respiratory distress, cachectic Eyes: negative scleral hemorrhage, negative  anisocoria, negative icterus ENT: Negative Runny nose, negative gingival bleeding, Neck:  Negative scars, masses, torticollis, lymphadenopathy, JVD Lungs: Clear to auscultation bilaterally, positive mild expiratory wheezes, without crackles Cardiovascular: Regular rate and rhythm without murmur gallop or rub normal S1 and S2 Abdomen: negative abdominal pain, nondistended, positive soft, bowel sounds, no rebound, no ascites, no appreciable mass Extremities: No significant cyanosis, clubbing, or edema bilateral lower extremities Skin: Negative rashes, lesions, ulcers Psychiatric:  Negative depression, negative anxiety, negative fatigue, negative mania  Central nervous system:  Cranial nerves II through XII intact, tongue/uvula midline, all extremities muscle strength 5/5, sensation intact throughout, negative dysarthria, negative expressive aphasia, negative receptive aphasia.  .     Data Reviewed: Care during the  described time interval was provided by me .  I have reviewed this patient's available data, including medical history, events of note, physical examination, and all test results as part of my evaluation.   CBC: Recent Labs  Lab 02/25/21 0808 02/26/21 0513  WBC 26.5* 30.7*  NEUTROABS 22.5* 27.9*  HGB 12.4* 11.6*  HCT 38.7* 36.3*  MCV 85.6 85.6  PLT 261 291   Basic Metabolic Panel: Recent Labs  Lab 02/25/21 0808 02/26/21 0513  NA 133* 138  K 3.8 3.7  CL 97* 103  CO2 24 28  GLUCOSE 164* 184*  BUN 25* 24*  CREATININE 1.15 0.87  CALCIUM 8.6* 8.2*  MG  --  2.8*  PHOS  --  2.2*   GFR: Estimated Creatinine Clearance: 66.6 mL/min (by C-G formula based on SCr of 0.87 mg/dL). Liver Function Tests: Recent Labs  Lab 02/26/21 0513  AST 40  ALT 34  ALKPHOS 76  BILITOT 0.3  PROT 7.2  ALBUMIN 2.8*   No results for input(s): LIPASE, AMYLASE in the last 168 hours. No results for input(s): AMMONIA in the last 168 hours. Coagulation Profile: No results for input(s): INR, PROTIME in the last 168 hours. Cardiac Enzymes: No results for input(s): CKTOTAL, CKMB, CKMBINDEX, TROPONINI in the last 168 hours. BNP (last 3 results) No results for input(s): PROBNP in the last 8760 hours. HbA1C: No results for input(s): HGBA1C in the last 72 hours. CBG: No results for input(s): GLUCAP in the last 168 hours. Lipid Profile: No results for input(s): CHOL, HDL, LDLCALC, TRIG, CHOLHDL, LDLDIRECT in the last 72 hours. Thyroid Function Tests: No results for input(s): TSH, T4TOTAL, FREET4, T3FREE, THYROIDAB in the last 72 hours. Anemia Panel: No results for input(s): VITAMINB12, FOLATE, FERRITIN, TIBC, IRON, RETICCTPCT in the last 72 hours. Urine analysis:    Component Value Date/Time   COLORURINE YELLOW 02/25/2021 2209   APPEARANCEUR HAZY (A) 02/25/2021 2209   LABSPEC >1.030 (H) 02/25/2021 2209   PHURINE 5.5 02/25/2021 2209   GLUCOSEU NEGATIVE 02/25/2021 2209   HGBUR MODERATE (A)  02/25/2021 2209   BILIRUBINUR NEGATIVE 02/25/2021 2209   KETONESUR NEGATIVE 02/25/2021 2209   PROTEINUR 100 (A) 02/25/2021 2209   NITRITE POSITIVE (A) 02/25/2021 2209   LEUKOCYTESUR TRACE (A) 02/25/2021 2209   Sepsis Labs: @LABRCNTIP (procalcitonin:4,lacticidven:4)  ) Recent Results (from the past 240 hour(s))  Resp Panel by RT-PCR (Flu A&B, Covid) Nasopharyngeal Swab     Status: None   Collection Time: 02/25/21  6:47 AM   Specimen: Nasopharyngeal Swab; Nasopharyngeal(NP) swabs in vial transport medium  Result Value Ref Range Status   SARS Coronavirus 2 by RT PCR NEGATIVE NEGATIVE Final    Comment: (NOTE) SARS-CoV-2 target nucleic acids are NOT DETECTED.  The SARS-CoV-2 RNA is generally detectable in upper  respiratory specimens during the acute phase of infection. The lowest concentration of SARS-CoV-2 viral copies this assay can detect is 138 copies/mL. A negative result does not preclude SARS-Cov-2 infection and should not be used as the sole basis for treatment or other patient management decisions. A negative result may occur with  improper specimen collection/handling, submission of specimen other than nasopharyngeal swab, presence of viral mutation(s) within the areas targeted by this assay, and inadequate number of viral copies(<138 copies/mL). A negative result must be combined with clinical observations, patient history, and epidemiological information. The expected result is Negative.  Fact Sheet for Patients:  BloggerCourse.com  Fact Sheet for Healthcare Providers:  SeriousBroker.it  This test is no t yet approved or cleared by the [place] FDA and  has been authorized for detection and/or diagnosis of SARS-CoV-2 by FDA under an Emergency Use Authorization (EUA). This EUA will remain  in effect (meaning this test can be used) for the duration of the COVID-19 declaration under Section 564(b)(1) of the Act,  21 U.S.C.section 360bbb-3(b)(1), unless the authorization is terminated  or revoked sooner.       Influenza A by PCR NEGATIVE NEGATIVE Final   Influenza B by PCR NEGATIVE NEGATIVE Final    Comment: (NOTE) The Xpert Xpress SARS-CoV-2/FLU/RSV plus assay is intended as an aid in the diagnosis of influenza from Nasopharyngeal swab specimens and should not be used as a sole basis for treatment. Nasal washings and aspirates are unacceptable for Xpert Xpress SARS-CoV-2/FLU/RSV testing.  Fact Sheet for Patients: BloggerCourse.com  Fact Sheet for Healthcare Providers: SeriousBroker.it  This test is not yet approved or cleared by the [place] FDA and has been authorized for detection and/or diagnosis of SARS-CoV-2 by FDA under an Emergency Use Authorization (EUA). This EUA will remain in effect (meaning this test can be used) for the duration of the COVID-19 declaration under Section 564(b)(1) of the Act, 21 U.S.C. section 360bbb-3(b)(1), unless the authorization is terminated or revoked.  Performed at [facility], [address] [place], [place] [address]   Culture, blood (routine x 2)     Status: None (Preliminary result)   Collection Time: 02/25/21  9:51 AM   Specimen: Left Antecubital; Blood  Result Value Ref Range Status   Specimen Description   Final    LEFT ANTECUBITAL BOTTLES DRAWN AEROBIC AND ANAEROBIC   Special Requests Blood Culture adequate volume  Final   Culture   Final    NO GROWTH < 24 HOURS Performed at [facility], [address] [place], [place] [address]    Report Status PENDING  Incomplete  Culture, blood (routine x 2)     Status: None (Preliminary result)   Collection Time: 02/25/21  9:54 AM   Specimen: Right Antecubital; Blood  Result Value Ref Range Status   Specimen Description   Final    RIGHT ANTECUBITAL BOTTLES DRAWN AEROBIC AND ANAEROBIC   Special Requests   Final    Blood Culture results may  not be optimal due to an excessive volume of blood received in culture bottles   Culture   Final    NO GROWTH < 24 HOURS Performed at [facility], [address] [place], [place] [address]    Report Status PENDING  Incomplete         Radiology Studies: DG Chest Port 1 View  Result Date: 02/25/2021 CLINICAL DATA:  Fever. EXAM: PORTABLE CHEST 1 VIEW COMPARISON:  07/20/2004 FINDINGS: 0805 hours. Focal airspace consolidation noted right mid lung. Left  lung clear. The cardiopericardial silhouette is within normal limits for size. The visualized bony structures of the thorax show no acute abnormality. Telemetry leads overlie the chest. IMPRESSION: Focal airspace consolidation right mid lung compatible with pneumonia. Follow-up imaging recommended to ensure resolution. Electronically Signed   By: Kennith CenterEric  Mansell M.D.   On: 02/25/2021 08:21        Scheduled Meds:  aspirin EC  81 mg Oral Daily   enoxaparin (LOVENOX) injection  40 mg Subcutaneous Q24H   methylPREDNISolone (SOLU-MEDROL) injection  60 mg Intravenous Q12H   nicotine  21 mg Transdermal Daily   ondansetron  4 mg Intravenous Once   Continuous Infusions:  sodium chloride 100 mL/hr at 02/26/21 0511   azithromycin 500 mg (02/26/21 0822)   methocarbamol (ROBAXIN) IV       LOS: 0 days   The patient is critically ill with multiple organ systems failure and requires high complexity decision making for assessment and support, frequent evaluation and titration of therapies, application of advanced monitoring technologies and extensive interpretation of multiple databases. Critical Care Time devoted to patient care services described in this note  Time spent: 40 minutes     Mattingly Fountaine, Roselind MessierURTIS J, MD [facility]   If 7PM-7AM, please contact night-coverage 02/26/2021, 12:28 PM

## 2021-02-27 LAB — COMPREHENSIVE METABOLIC PANEL
ALT: 76 U/L — ABNORMAL HIGH (ref 0–44)
AST: 70 U/L — ABNORMAL HIGH (ref 15–41)
Albumin: 2.7 g/dL — ABNORMAL LOW (ref 3.5–5.0)
Alkaline Phosphatase: 80 U/L (ref 38–126)
Anion gap: 9 (ref 5–15)
BUN: 21 mg/dL (ref 8–23)
CO2: 28 mmol/L (ref 22–32)
Calcium: 8.8 mg/dL — ABNORMAL LOW (ref 8.9–10.3)
Chloride: 102 mmol/L (ref 98–111)
Creatinine, Ser: 0.82 mg/dL (ref 0.61–1.24)
GFR, Estimated: >60 mL/min (ref >60)
Glucose, Bld: 178 mg/dL — ABNORMAL HIGH (ref 70–99)
Potassium: 4.6 mmol/L (ref 3.5–5.1)
Sodium: 139 mmol/L (ref 135–145)
Total Bilirubin: 0.2 mg/dL — ABNORMAL LOW (ref 0.3–1.2)
Total Protein: 6.8 g/dL (ref 6.5–8.1)

## 2021-02-27 LAB — CBC WITH DIFFERENTIAL/PLATELET
Abs Immature Granulocytes: 0.27 10*3/uL — ABNORMAL HIGH (ref 0.00–0.07)
Basophils Absolute: 0.1 10*3/uL (ref 0.0–0.1)
Basophils Relative: 0 %
Eosinophils Absolute: 0 10*3/uL (ref 0.0–0.5)
Eosinophils Relative: 0 %
HCT: 36.2 % — ABNORMAL LOW (ref 39.0–52.0)
Hemoglobin: 11.4 g/dL — ABNORMAL LOW (ref 13.0–17.0)
Immature Granulocytes: 1 %
Lymphocytes Relative: 4 %
Lymphs Abs: 0.9 10*3/uL (ref 0.7–4.0)
MCH: 27 pg (ref 26.0–34.0)
MCHC: 31.5 g/dL (ref 30.0–36.0)
MCV: 85.8 fL (ref 80.0–100.0)
Monocytes Absolute: 0.9 10*3/uL (ref 0.1–1.0)
Monocytes Relative: 4 %
Neutro Abs: 19.7 10*3/uL — ABNORMAL HIGH (ref 1.7–7.7)
Neutrophils Relative %: 91 %
Platelets: 347 10*3/uL (ref 150–400)
RBC: 4.22 MIL/uL (ref 4.22–5.81)
RDW: 14.9 % (ref 11.5–15.5)
WBC: 21.7 10*3/uL — ABNORMAL HIGH (ref 4.0–10.5)
nRBC: 0 % (ref 0.0–0.2)

## 2021-02-27 LAB — PROCALCITONIN: Procalcitonin: 4.97 ng/mL

## 2021-02-27 LAB — MAGNESIUM: Magnesium: 2.5 mg/dL — ABNORMAL HIGH (ref 1.7–2.4)

## 2021-02-27 LAB — PHOSPHORUS: Phosphorus: 2 mg/dL — ABNORMAL LOW (ref 2.5–4.6)

## 2021-02-27 LAB — EXPECTORATED SPUTUM ASSESSMENT W GRAM STAIN, RFLX TO RESP C

## 2021-02-27 NOTE — Progress Notes (Signed)
PROGRESS NOTE    Gregory Camacho  TMA:[id number] DOB: September 16, 1960 DOA: 02/25/2021 PCP: Patient, No Pcp Per (Inactive)   Brief Narrative:   Gregory Camacho is a 60 y.o. BM PMHx depression, sinus congestion.  Drug abuse   Presents with complaint of body aches chills, chest pain, cough.  Symptoms ongoing since yesterday.  Denies diarrhea, +1 episode of vomiting nonbloody.  Patient otherwise vaccinated for COVID at baseline.    Subjective: 9/4 afebrile overnight,    Assessment & Plan:  Covid vaccination; vaccinated 2/4   Active Problems:   Depression   Pneumonia   Drug abuse, marijuana   Drug abuse, cocaine type (HCC)   Pneumonia - Continue current antibiotics course 5 to 7 days.  Will adjust antibiotics if we obtain positive sputum culture. - DuoNeb QID -Solu-Medrol 60 mg BID - Expiratory spirometry - Flutter valve Lab Results  Component Value Date   WBC 21.7 (H) 02/27/2021   WBC 30.7 (H) 02/26/2021   WBC 26.5 (H) 02/25/2021   WBC 10.1 08/07/2018   WBC 13.9 (H) 05/27/2018     Depression -Not on home meds   Nicotine abuse - Nicotine patch   Drug abuse - 9/2 urine rapid drug screen positive cocaine and marijuana -9/3 Counseling of resources available to stop use of marijuana and cocaine  Goals of care - 9/4 consult to LCSW: Patient requests to obtain a list of PCPs accepting new patients in town prior to discharge most likely in the [place] NOTE: Also would be of benefit to have list of resources to help in discontinuing use of illegal substances    DVT prophylaxis: Lovenox Code Status: Full Family Communication:  Status is: Inpatient    Dispo: The patient is from: Home              Anticipated d/c is to: Home              Anticipated d/c date is: 2 days              Patient currently is not medically stable to d/c.      Consultants:    Procedures/Significant Events:     I have personally reviewed and interpreted all radiology studies and my  findings are as above.  VENTILATOR SETTINGS:    Cultures 9/2 blood LEFT AC NGTD 9/2 blood RIGHT AC NGTD 9/4 sputum pending    Antimicrobials: Anti-infectives (From admission, onward)    Start     Dose/Rate Route Frequency Ordered Stop   02/25/21 0915  cefTRIAXone (ROCEPHIN) 1 g in sodium chloride 0.9 % 100 mL IVPB        1 g 200 mL/hr over 30 Minutes Intravenous  Once 02/25/21 0905 02/25/21 1214   02/25/21 0915  azithromycin (ZITHROMAX) 500 mg in sodium chloride 0.9 % 250 mL IVPB        500 mg 250 mL/hr over 60 Minutes Intravenous Every 24 hours 02/25/21 0905           Devices    LINES / TUBES:      Continuous Infusions:  sodium chloride 100 mL/hr at 02/27/21 0454   azithromycin 500 mg (02/27/21 0823)   methocarbamol (ROBAXIN) IV       Objective: Vitals:   02/26/21 1338 02/26/21 2042 02/27/21 0354 02/27/21 1350  BP: 108/68 130/77 116/63 (!) 143/85  Pulse: 70 (!) 56 69 63  Resp: 18 18 18 18   Temp: 98.6 F (37 C) 98.4 F (36.9 C) 98.2 F (36.8 C)  98 F (36.7 C)  TempSrc: Oral Oral Oral Oral  SpO2: 99% 99% 97% 99%  Weight:      Height:        Intake/Output Summary (Last 24 hours) at 02/27/2021 1628 Last data filed at 02/27/2021 1300 Gross per 24 hour  Intake 4067.95 ml  Output 700 ml  Net 3367.95 ml    Filed Weights   02/25/21 0631 02/25/21 1944  Weight: 56.7 kg 53.5 kg    Examination:  General: A/O x4 No acute respiratory distress, cachectic Eyes: negative scleral hemorrhage, negative anisocoria, negative icterus ENT: Negative Runny nose, negative gingival bleeding, Neck:  Negative scars, masses, torticollis, lymphadenopathy, JVD Lungs: Clear to auscultation bilaterally, positive mild expiratory wheezes, without crackles Cardiovascular: Regular rate and rhythm without murmur gallop or rub normal S1 and S2 Abdomen: negative abdominal pain, nondistended, positive soft, bowel sounds, no rebound, no ascites, no appreciable mass Extremities: No  significant cyanosis, clubbing, or edema bilateral lower extremities Skin: Negative rashes, lesions, ulcers Psychiatric:  Negative depression, negative anxiety, negative fatigue, negative mania  Central nervous system:  Cranial nerves II through XII intact, tongue/uvula midline, all extremities muscle strength 5/5, sensation intact throughout, negative dysarthria, negative expressive aphasia, negative receptive aphasia.  .     Data Reviewed: Care during the described time interval was provided by me .  I have reviewed this patient's available data, including medical history, events of note, physical examination, and all test results as part of my evaluation.   CBC: Recent Labs  Lab 02/25/21 0808 02/26/21 0513 02/27/21 0653  WBC 26.5* 30.7* 21.7*  NEUTROABS 22.5* 27.9* 19.7*  HGB 12.4* 11.6* 11.4*  HCT 38.7* 36.3* 36.2*  MCV 85.6 85.6 85.8  PLT 261 291 347    Basic Metabolic Panel: Recent Labs  Lab 02/25/21 0808 02/26/21 0513 02/27/21 0653  NA 133* 138 139  K 3.8 3.7 4.6  CL 97* 103 102  CO2 24 28 28   GLUCOSE 164* 184* 178*  BUN 25* 24* 21  CREATININE 1.15 0.87 0.82  CALCIUM 8.6* 8.2* 8.8*  MG  --  2.8* 2.5*  PHOS  --  2.2* 2.0*    GFR: Estimated Creatinine Clearance: 70.7 mL/min (by C-G formula based on SCr of 0.82 mg/dL). Liver Function Tests: Recent Labs  Lab 02/26/21 0513 02/27/21 0653  AST 40 70*  ALT 34 76*  ALKPHOS 76 80  BILITOT 0.3 0.2*  PROT 7.2 6.8  ALBUMIN 2.8* 2.7*    No results for input(s): LIPASE, AMYLASE in the last 168 hours. No results for input(s): AMMONIA in the last 168 hours. Coagulation Profile: No results for input(s): INR, PROTIME in the last 168 hours. Cardiac Enzymes: No results for input(s): CKTOTAL, CKMB, CKMBINDEX, TROPONINI in the last 168 hours. BNP (last 3 results) No results for input(s): PROBNP in the last 8760 hours. HbA1C: No results for input(s): HGBA1C in the last 72 hours. CBG: No results for input(s): GLUCAP  in the last 168 hours. Lipid Profile: No results for input(s): CHOL, HDL, LDLCALC, TRIG, CHOLHDL, LDLDIRECT in the last 72 hours. Thyroid Function Tests: No results for input(s): TSH, T4TOTAL, FREET4, T3FREE, THYROIDAB in the last 72 hours. Anemia Panel: No results for input(s): VITAMINB12, FOLATE, FERRITIN, TIBC, IRON, RETICCTPCT in the last 72 hours. Urine analysis:    Component Value Date/Time   COLORURINE YELLOW 02/25/2021 2209   APPEARANCEUR HAZY (A) 02/25/2021 2209   LABSPEC >1.030 (H) 02/25/2021 2209   PHURINE 5.5 02/25/2021 2209   GLUCOSEU NEGATIVE 02/25/2021 2209  HGBUR MODERATE (A) 02/25/2021 2209   BILIRUBINUR NEGATIVE 02/25/2021 2209   KETONESUR NEGATIVE 02/25/2021 2209   PROTEINUR 100 (A) 02/25/2021 2209   NITRITE POSITIVE (A) 02/25/2021 2209   LEUKOCYTESUR TRACE (A) 02/25/2021 2209   Sepsis Labs: @LABRCNTIP (procalcitonin:4,lacticidven:4)  ) Recent Results (from the past 240 hour(s))  Resp Panel by RT-PCR (Flu A&B, Covid) Nasopharyngeal Swab     Status: None   Collection Time: 02/25/21  6:47 AM   Specimen: Nasopharyngeal Swab; Nasopharyngeal(NP) swabs in vial transport medium  Result Value Ref Range Status   SARS Coronavirus 2 by RT PCR NEGATIVE NEGATIVE Final    Comment: (NOTE) SARS-CoV-2 target nucleic acids are NOT DETECTED.  The SARS-CoV-2 RNA is generally detectable in upper respiratory specimens during the acute phase of infection. The lowest concentration of SARS-CoV-2 viral copies this assay can detect is 138 copies/mL. A negative result does not preclude SARS-Cov-2 infection and should not be used as the sole basis for treatment or other patient management decisions. A negative result may occur with  improper specimen collection/handling, submission of specimen other than nasopharyngeal swab, presence of viral mutation(s) within the areas targeted by this assay, and inadequate number of viral copies(<138 copies/mL). A negative result must be combined  with clinical observations, patient history, and epidemiological information. The expected result is Negative.  Fact Sheet for Patients:  04/27/21  Fact Sheet for Healthcare Providers:  BloggerCourse.com  This test is no t yet approved or cleared by the [employer] FDA and  has been authorized for detection and/or diagnosis of SARS-CoV-2 by FDA under an Emergency Use Authorization (EUA). This EUA will remain  in effect (meaning this test can be used) for the duration of the COVID-19 declaration under Section 564(b)(1) of the Act, 21 U.S.C.section 360bbb-3(b)(1), unless the authorization is terminated  or revoked sooner.       Influenza A by PCR NEGATIVE NEGATIVE Final   Influenza B by PCR NEGATIVE NEGATIVE Final    Comment: (NOTE) The Xpert Xpress SARS-CoV-2/FLU/RSV plus assay is intended as an aid in the diagnosis of influenza from Nasopharyngeal swab specimens and should not be used as a sole basis for treatment. Nasal washings and aspirates are unacceptable for Xpert Xpress SARS-CoV-2/FLU/RSV testing.  Fact Sheet for Patients: [place]  Fact Sheet for Healthcare Providers: BloggerCourse.com  This test is not yet approved or cleared by the [employer] FDA and has been authorized for detection and/or diagnosis of SARS-CoV-2 by FDA under an Emergency Use Authorization (EUA). This EUA will remain in effect (meaning this test can be used) for the duration of the COVID-19 declaration under Section 564(b)(1) of the Act, 21 U.S.C. section 360bbb-3(b)(1), unless the authorization is terminated or revoked.  Performed at [facility], [address], [place], [place]   Culture, blood (routine x 2)     Status: None (Preliminary result)   Collection Time: 02/25/21  9:51 AM   Specimen: Left Antecubital; Blood  Result Value Ref Range Status   Specimen  Description   Final    LEFT ANTECUBITAL BOTTLES DRAWN AEROBIC AND ANAEROBIC   Special Requests Blood Culture adequate volume  Final   Culture   Final    NO GROWTH 2 DAYS Performed at [facility], [address] [place], [place]    Report Status PENDING  Incomplete  Culture, blood (routine x 2)     Status: None (Preliminary result)   Collection Time: 02/25/21  9:54 AM   Specimen: Right Antecubital; Blood  Result  Value Ref Range Status   Specimen Description   Final    RIGHT ANTECUBITAL BOTTLES DRAWN AEROBIC AND ANAEROBIC   Special Requests   Final    Blood Culture results may not be optimal due to an excessive volume of blood received in culture bottles   Culture   Final    NO GROWTH 2 DAYS Performed at [facility], [address] [place], [place] [address]    Report Status PENDING  Incomplete  Expectorated Sputum Assessment w Gram Stain, Rflx to Resp Cult     Status: None   Collection Time: 02/27/21  7:20 AM   Specimen: Sputum  Result Value Ref Range Status   Specimen Description SPUTUM  Final   Special Requests NONE  Final   Sputum evaluation   Final    THIS SPECIMEN IS ACCEPTABLE FOR SPUTUM CULTURE Performed at [facility], [address] [place], [place] [address]    Report Status 02/27/2021 FINAL  Final         Radiology Studies: No results found.      Scheduled Meds:  aspirin EC  81 mg Oral Daily   enoxaparin (LOVENOX) injection  40 mg Subcutaneous Q24H   famotidine  20 mg Oral BID   methylPREDNISolone (SOLU-MEDROL) injection  60 mg Intravenous Q12H   nicotine  21 mg Transdermal Daily   ondansetron  4 mg Intravenous Once   Continuous Infusions:  sodium chloride 100 mL/hr at 02/27/21 0454   azithromycin 500 mg (02/27/21 0823)   methocarbamol (ROBAXIN) IV       LOS: 1 day   The patient is critically ill with multiple organ systems failure and requires high complexity decision making for assessment and support, frequent evaluation and  titration of therapies, application of advanced monitoring technologies and extensive interpretation of multiple databases. Critical Care Time devoted to patient care services described in this note  Time spent: 40 minutes     Honestie Kulik, Roselind MessierURTIS J, MD [facility]   If 7PM-7AM, please contact night-coverage 02/27/2021, 4:28 PM

## 2021-02-28 LAB — CBC WITH DIFFERENTIAL/PLATELET
Abs Immature Granulocytes: 0.19 10*3/uL — ABNORMAL HIGH (ref 0.00–0.07)
Basophils Absolute: 0 10*3/uL (ref 0.0–0.1)
Basophils Relative: 0 %
Eosinophils Absolute: 0 10*3/uL (ref 0.0–0.5)
Eosinophils Relative: 0 %
HCT: 32.1 % — ABNORMAL LOW (ref 39.0–52.0)
Hemoglobin: 10.2 g/dL — ABNORMAL LOW (ref 13.0–17.0)
Immature Granulocytes: 2 %
Lymphocytes Relative: 6 %
Lymphs Abs: 0.8 10*3/uL (ref 0.7–4.0)
MCH: 26.9 pg (ref 26.0–34.0)
MCHC: 31.8 g/dL (ref 30.0–36.0)
MCV: 84.7 fL (ref 80.0–100.0)
Monocytes Absolute: 0.5 10*3/uL (ref 0.1–1.0)
Monocytes Relative: 4 %
Neutro Abs: 11.4 10*3/uL — ABNORMAL HIGH (ref 1.7–7.7)
Neutrophils Relative %: 88 %
Platelets: 335 10*3/uL (ref 150–400)
RBC: 3.79 MIL/uL — ABNORMAL LOW (ref 4.22–5.81)
RDW: 14.8 % (ref 11.5–15.5)
WBC: 12.9 10*3/uL — ABNORMAL HIGH (ref 4.0–10.5)
nRBC: 0 % (ref 0.0–0.2)

## 2021-02-28 LAB — COMPREHENSIVE METABOLIC PANEL
ALT: 76 U/L — ABNORMAL HIGH (ref 0–44)
AST: 59 U/L — ABNORMAL HIGH (ref 15–41)
Albumin: 2.3 g/dL — ABNORMAL LOW (ref 3.5–5.0)
Alkaline Phosphatase: 63 U/L (ref 38–126)
Anion gap: 6 (ref 5–15)
BUN: 16 mg/dL (ref 8–23)
CO2: 26 mmol/L (ref 22–32)
Calcium: 8.2 mg/dL — ABNORMAL LOW (ref 8.9–10.3)
Chloride: 107 mmol/L (ref 98–111)
Creatinine, Ser: 0.71 mg/dL (ref 0.61–1.24)
GFR, Estimated: >60 mL/min (ref >60)
Glucose, Bld: 168 mg/dL — ABNORMAL HIGH (ref 70–99)
Potassium: 3.9 mmol/L (ref 3.5–5.1)
Sodium: 139 mmol/L (ref 135–145)
Total Bilirubin: 0.3 mg/dL (ref 0.3–1.2)
Total Protein: 5.9 g/dL — ABNORMAL LOW (ref 6.5–8.1)

## 2021-02-28 LAB — PHOSPHORUS: Phosphorus: 2.1 mg/dL — ABNORMAL LOW (ref 2.5–4.6)

## 2021-02-28 LAB — MAGNESIUM: Magnesium: 2.2 mg/dL (ref 1.7–2.4)

## 2021-02-28 MED ORDER — IPRATROPIUM-ALBUTEROL 20-100 MCG/ACT IN AERS: 1.0000 | INHALATION_SPRAY | Freq: Four times a day (QID) | RESPIRATORY_TRACT | 0 refills | Status: DC | PRN

## 2021-02-28 MED ORDER — FAMOTIDINE 20 MG PO TABS: 20.0000 mg | ORAL_TABLET | Freq: Two times a day (BID) | ORAL | 0 refills | Status: DC

## 2021-02-28 MED ORDER — AZITHROMYCIN 250 MG PO TABS: 250.0000 mg | ORAL_TABLET | Freq: Every day | ORAL | 0 refills | Status: DC

## 2021-02-28 MED ORDER — ONDANSETRON HCL 4 MG PO TABS: 4.0000 mg | ORAL_TABLET | Freq: Four times a day (QID) | ORAL | 0 refills | Status: DC | PRN

## 2021-02-28 MED ORDER — NICOTINE 21 MG/24HR TD PT24: 21.0000 mg | MEDICATED_PATCH | Freq: Every day | TRANSDERMAL | 0 refills | Status: DC

## 2021-02-28 MED ORDER — AZITHROMYCIN 250 MG PO TABS: 500.0000 mg | ORAL_TABLET | Freq: Every day | ORAL | Status: DC

## 2021-02-28 MED ORDER — ZOLPIDEM TARTRATE 5 MG PO TABS: 5.0000 mg | ORAL_TABLET | Freq: Every evening | ORAL | 0 refills | Status: DC | PRN

## 2021-02-28 MED ORDER — PREDNISONE 10 MG PO TABS: 40.0000 mg | ORAL_TABLET | Freq: Every day | ORAL | 0 refills | Status: DC

## 2021-02-28 NOTE — TOC Progression Note (Signed)
Transition of Care [facility]) - Progression Note    Patient Details  Name: Gregory Camacho MRN: [id number] Date of Birth: [birth year]  [facility] CM/SW Contact  Leitha Bleak, RN Phone Number: 02/28/2021, 12:56 PM  Clinical Narrative:   [facility] consulted for PCP list and Substance abuse resources. Patient states he does not do drugs, list provided, patient accepted.   Expected Discharge Plan: Home/Self Care Barriers to Discharge: Continued Medical Work up  Expected Discharge Plan and Services Expected Discharge Plan: Home/Self Care     Expected Discharge Date: 02/28/21                   Readmission Risk Interventions Readmission Risk Prevention Plan 02/28/2021  Transportation Screening Complete  Home Care Screening Complete  Medication Review (RN CM) Complete  Some recent data might be hidden

## 2021-02-28 NOTE — Discharge Summary (Signed)
Physician Discharge Summary  [name] YQM:[id number] DOB: [birth year] DOA: 02/25/2021  PCP: Patient, No Pcp Per (Inactive)  Admit date: 02/25/2021 Discharge date: 02/28/2021  Time spent: 35 minutes  Recommendations for Outpatient Follow-up:   Covid vaccination; vaccinated 2/4   Pneumonia - Continue current antibiotics course 5 to 7 days.  Will adjust antibiotics if we obtain positive sputum culture. - DuoNeb QID -Solu-Medrol 60 mg BID - Expiratory spirometry - Flutter valve Lab Results  Component Value Date   WBC 12.9 (H) 02/28/2021   WBC 21.7 (H) 02/27/2021   WBC 30.7 (H) 02/26/2021   WBC 26.5 (H) 02/25/2021   WBC 10.1 08/07/2018   Depression -Not on home meds   Nicotine abuse - Nicotine patch   Drug abuse - 9/2 urine rapid drug screen positive cocaine and marijuana -9/3 Counseling of resources available to stop use of marijuana and cocaine   Goals of care - 9/4 consult to LCSW: Patient requests to obtain a list of PCPs accepting new patients in town prior to discharge most likely in the [place] NOTE: Also would be of benefit to have list of resources to help in discontinuing use of illegal substances    Discharge Diagnoses:  Active Problems:   Depression   Pneumonia   Drug abuse, marijuana   Drug abuse, cocaine type [facility])   Discharge Condition: Stable  Diet recommendation: Regular  Filed Weights   02/25/21 0631 02/25/21 1944  Weight: 56.7 kg 53.5 kg    History of present illness:  Gregory Camacho is a 60 y.o. BM PMHx depression, sinus congestion.  Drug abuse   Presents with complaint of body aches chills, chest pain, cough.  Symptoms ongoing since yesterday.  Denies diarrhea, +1 episode of vomiting nonbloody.  Patient otherwise vaccinated for COVID at baseline.    Hospital Course:  See above   Cultures  9/2 blood LEFT AC NGTD 9/2 blood RIGHT AC NGTD 9/4 sputum pending  Antibiotics Anti-infectives (From admission, onward)    Start     Ordered  Stop   03/01/21 1000  azithromycin (ZITHROMAX) tablet 500 mg        02/28/21 1210     02/28/21 0000  azithromycin (ZITHROMAX) 250 MG tablet        02/28/21 1222     02/25/21 0915  cefTRIAXone (ROCEPHIN) 1 g in sodium chloride 0.9 % 100 mL IVPB        02/25/21 0905 02/25/21 1214   02/25/21 0915  azithromycin (ZITHROMAX) 500 mg in sodium chloride 0.9 % 250 mL IVPB  Status:  Discontinued        02/25/21 0905 02/28/21 1210         Discharge Exam: Vitals:   02/27/21 0354 02/27/21 1350 02/27/21 2036 02/28/21 0341  BP: 116/63 (!) 143/85 123/85 132/82  Pulse: 69 63 65 (!) 59  Resp: 18 18 18 18   Temp: 98.2 F (36.8 C) 98 F (36.7 C) 98.1 F (36.7 C) 98 F (36.7 C)  TempSrc: Oral Oral Oral Oral  SpO2: 97% 99% 97% 98%  Weight:      Height:        General: A/O x4 No acute respiratory distress, cachectic Eyes: negative scleral hemorrhage, negative anisocoria, negative icterus ENT: Negative Runny nose, negative gingival bleeding, Neck:  Negative scars, masses, torticollis, lymphadenopathy, JVD Lungs: Clear to auscultation bilaterally, positive mild expiratory wheezes, without crackles Cardiovascular: Regular rate and rhythm without murmur gallop or rub normal S1 and S2   Discharge Instructions  Allergies as of 02/28/2021   No Known Allergies      Medication List     STOP taking these medications    ciprofloxacin 500 MG tablet Commonly known as: CIPRO       TAKE these medications    azithromycin 250 MG tablet Commonly known as: ZITHROMAX Take 1 tablet (250 mg total) by mouth daily.   famotidine 20 MG tablet Commonly known as: PEPCID Take 1 tablet (20 mg total) by mouth 2 (two) times daily.   Ipratropium-Albuterol 20-100 MCG/ACT Aers respimat Commonly known as: COMBIVENT Inhale 1 puff into the lungs every 6 (six) hours as needed for wheezing (SOB).   nicotine 21 mg/24hr patch Commonly known as: NICODERM CQ - dosed in mg/24 hours Place 1 patch (21 mg total)  onto the skin daily. Start taking on: March 01, 2021   ondansetron 4 MG tablet Commonly known as: ZOFRAN Take 1 tablet (4 mg total) by mouth every 6 (six) hours as needed for nausea.   predniSONE 10 MG tablet Commonly known as: DELTASONE Take 4 tablets (40 mg total) by mouth daily.   zolpidem 5 MG tablet Commonly known as: AMBIEN Take 1 tablet (5 mg total) by mouth at bedtime as needed for sleep.       No Known Allergies    The results of significant diagnostics from this hospitalization (including imaging, microbiology, ancillary and laboratory) are listed below for reference.    Significant Diagnostic Studies: DG Chest Port 1 View  Result Date: 02/25/2021 CLINICAL DATA:  Fever. EXAM: PORTABLE CHEST 1 VIEW COMPARISON:  07/20/2004 FINDINGS: 0805 hours. Focal airspace consolidation noted right mid lung. Left lung clear. The cardiopericardial silhouette is within normal limits for size. The visualized bony structures of the thorax show no acute abnormality. Telemetry leads overlie the chest. IMPRESSION: Focal airspace consolidation right mid lung compatible with pneumonia. Follow-up imaging recommended to ensure resolution. Electronically Signed   By: Kennith Center M.D.   On: 02/25/2021 08:21    Microbiology: Recent Results (from the past 240 hour(s))  Resp Panel by RT-PCR (Flu A&B, Covid) Nasopharyngeal Swab     Status: None   Collection Time: 02/25/21  6:47 AM   Specimen: Nasopharyngeal Swab; Nasopharyngeal(NP) swabs in vial transport medium  Result Value Ref Range Status   SARS Coronavirus 2 by RT PCR NEGATIVE NEGATIVE Final    Comment: (NOTE) SARS-CoV-2 target nucleic acids are NOT DETECTED.  The SARS-CoV-2 RNA is generally detectable in upper respiratory specimens during the acute phase of infection. The lowest concentration of SARS-CoV-2 viral copies this assay can detect is 138 copies/mL. A negative result does not preclude SARS-Cov-2 infection and should not be used  as the sole basis for treatment or other patient management decisions. A negative result may occur with  improper specimen collection/handling, submission of specimen other than nasopharyngeal swab, presence of viral mutation(s) within the areas targeted by this assay, and inadequate number of viral copies(<138 copies/mL). A negative result must be combined with clinical observations, patient history, and epidemiological information. The expected result is Negative.  Fact Sheet for Patients:  BloggerCourse.com  Fact Sheet for Healthcare Providers:  SeriousBroker.it  This test is no t yet approved or cleared by the [place] FDA and  has been authorized for detection and/or diagnosis of SARS-CoV-2 by FDA under an Emergency Use Authorization (EUA). This EUA will remain  in effect (meaning this test can be used) for the duration of the COVID-19 declaration under Section 564(b)(1) of the Act,  21 U.S.C.section 360bbb-3(b)(1), unless the authorization is terminated  or revoked sooner.       Influenza A by PCR NEGATIVE NEGATIVE Final   Influenza B by PCR NEGATIVE NEGATIVE Final    Comment: (NOTE) The Xpert Xpress SARS-CoV-2/FLU/RSV plus assay is intended as an aid in the diagnosis of influenza from Nasopharyngeal swab specimens and should not be used as a sole basis for treatment. Nasal washings and aspirates are unacceptable for Xpert Xpress SARS-CoV-2/FLU/RSV testing.  Fact Sheet for Patients: BloggerCourse.comhttps://www.fda.gov/media/152166/download  Fact Sheet for Healthcare Providers: SeriousBroker.ithttps://www.fda.gov/media/152162/download  This test is not yet approved or cleared by the [place] FDA and has been authorized for detection and/or diagnosis of SARS-CoV-2 by FDA under an Emergency Use Authorization (EUA). This EUA will remain in effect (meaning this test can be used) for the duration of the COVID-19 declaration under Section 564(b)(1)  of the Act, 21 U.S.C. section 360bbb-3(b)(1), unless the authorization is terminated or revoked.  Performed at [facility], [address] [place], [place] [address]   Culture, blood (routine x 2)     Status: None (Preliminary result)   Collection Time: 02/25/21  9:51 AM   Specimen: Left Antecubital; Blood  Result Value Ref Range Status   Specimen Description   Final    LEFT ANTECUBITAL BOTTLES DRAWN AEROBIC AND ANAEROBIC   Special Requests Blood Culture adequate volume  Final   Culture   Final    NO GROWTH 3 DAYS Performed at [facility], [address] [place], [place] [address]    Report Status PENDING  Incomplete  Culture, blood (routine x 2)     Status: None (Preliminary result)   Collection Time: 02/25/21  9:54 AM   Specimen: Right Antecubital; Blood  Result Value Ref Range Status   Specimen Description   Final    RIGHT ANTECUBITAL BOTTLES DRAWN AEROBIC AND ANAEROBIC   Special Requests   Final    Blood Culture results may not be optimal due to an excessive volume of blood received in culture bottles   Culture   Final    NO GROWTH 3 DAYS Performed at [facility], [address] [place], [place] [address]    Report Status PENDING  Incomplete  Expectorated Sputum Assessment w Gram Stain, Rflx to Resp Cult     Status: None   Collection Time: 02/27/21  7:20 AM   Specimen: Sputum  Result Value Ref Range Status   Specimen Description SPUTUM  Final   Special Requests NONE  Final   Sputum evaluation   Final    THIS SPECIMEN IS ACCEPTABLE FOR SPUTUM CULTURE Performed at [facility], [address] [place], [place] [address]    Report Status 02/27/2021 FINAL  Final  Culture, Respiratory w Gram Stain     Status: None (Preliminary result)   Collection Time: 02/27/21  7:20 AM   Specimen: SPU  Result Value Ref Range Status   Specimen Description   Final    SPUTUM Performed at [facility], [address] [place], [place] [address]    Special Requests   Final    NONE  Reflexed from [id number] Performed at [facility], [address] [place], [place] [address]    Gram Stain   Final    RARE SQUAMOUS EPITHELIAL CELLS PRESENT FEW WBC PRESENT,BOTH PMN AND MONONUCLEAR FEW GRAM POSITIVE COCCI IN CLUSTERS FEW GRAM VARIABLE ROD    Culture   Final    CULTURE REINCUBATED FOR BETTER GROWTH Performed at [facility],  [address] [place], [place] [address]    Report Status PENDING  Incomplete     Labs: Basic Metabolic Panel: Recent Labs  Lab 02/25/21 0808 02/26/21 0513 02/27/21 0653 02/28/21 0501  NA 133* 138 139 139  K 3.8 3.7 4.6 3.9  CL 97* 103 102 107  CO2 24 28 28 26   GLUCOSE 164* 184* 178* 168*  BUN 25* 24* 21 16  CREATININE 1.15 0.87 0.82 0.71  CALCIUM 8.6* 8.2* 8.8* 8.2*  MG  --  2.8* 2.5* 2.2  PHOS  --  2.2* 2.0* 2.1*   Liver Function Tests: Recent Labs  Lab 02/26/21 0513 02/27/21 0653 02/28/21 0501  AST 40 70* 59*  ALT 34 76* 76*  ALKPHOS 76 80 63  BILITOT 0.3 0.2* 0.3  PROT 7.2 6.8 5.9*  ALBUMIN 2.8* 2.7* 2.3*   No results for input(s): LIPASE, AMYLASE in the last 168 hours. No results for input(s): AMMONIA in the last 168 hours. CBC: Recent Labs  Lab 02/25/21 0808 02/26/21 0513 02/27/21 0653 02/28/21 0501  WBC 26.5* 30.7* 21.7* 12.9*  NEUTROABS 22.5* 27.9* 19.7* 11.4*  HGB 12.4* 11.6* 11.4* 10.2*  HCT 38.7* 36.3* 36.2* 32.1*  MCV 85.6 85.6 85.8 84.7  PLT 261 291 347 335   Cardiac Enzymes: No results for input(s): CKTOTAL, CKMB, CKMBINDEX, TROPONINI in the last 168 hours. BNP: BNP (last 3 results) No results for input(s): BNP in the last 8760 hours.  ProBNP (last 3 results) No results for input(s): PROBNP in the last 8760 hours.  CBG: No results for input(s): GLUCAP in the last 168 hours.     Signed:  04/30/21, MD [facility]

## 2021-03-01 LAB — CULTURE, RESPIRATORY W GRAM STAIN: Culture: NORMAL

## 2021-03-02 LAB — CULTURE, BLOOD (ROUTINE X 2)
Culture: NO GROWTH
Culture: NO GROWTH
Special Requests: ADEQUATE

## 2021-03-02 LAB — LEGIONELLA PNEUMOPHILA SEROGP 1 UR AG: L. pneumophila Serogp 1 Ur Ag: NEGATIVE

## 2021-03-03 ENCOUNTER — Emergency Department (HOSPITAL_COMMUNITY)
Admission: EM | Admit: 2021-03-03 | Discharge: 2021-03-03 | Disposition: A | Discharge status: Home / Self Care | Payer: Medicaid Other | Attending: Emergency Medicine | Admitting: Emergency Medicine

## 2021-03-03 ENCOUNTER — Encounter (HOSPITAL_COMMUNITY): Payer: Self-pay

## 2021-03-03 ENCOUNTER — Other Ambulatory Visit: Payer: Self-pay

## 2021-03-03 DIAGNOSIS — F17210 Nicotine dependence, cigarettes, uncomplicated: Secondary | ICD-10-CM | POA: Diagnosis not present

## 2021-03-03 DIAGNOSIS — N4829 Other inflammatory disorders of penis: Secondary | ICD-10-CM | POA: Diagnosis present

## 2021-03-03 DIAGNOSIS — J441 Chronic obstructive pulmonary disease with (acute) exacerbation: Secondary | ICD-10-CM | POA: Diagnosis not present

## 2021-03-03 DIAGNOSIS — N4889 Other specified disorders of penis: Secondary | ICD-10-CM | POA: Diagnosis not present

## 2021-03-03 NOTE — ED Triage Notes (Signed)
Pt presents to ED with penis swelling while he was using bathroom yesterday, pt says this morning the swelling went down, but then when he took his medicine, it swelled back up again. Pt says he was in hospital last week for PNA and started on steroids.

## 2021-03-03 NOTE — ED Provider Notes (Signed)
[facility] EMERGENCY DEPARTMENT Provider Note   CSN: [id number] Arrival date & time: 03/03/21  1848     History Chief Complaint  Patient presents with   Groin Swelling    Gregory Camacho is a 60 y.o. male.  He is here with a complaint of swelling to his penis that started yesterday.  Its not painful and he is having no difficulty urinating.  No testicular pain.  Of note he was recently in the hospital and is on a steroid taper for COPD exacerbation.  He feels his breathing is back to baseline and he said he can even mow the lawn today.  The history is provided by the patient.  Male GU Problem Presenting symptoms: swelling   Presenting symptoms: no dysuria, no penile discharge, no penile pain and no scrotal pain   Context: spontaneously   Relieved by:  None tried Worsened by:  Nothing Ineffective treatments:  None tried Associated symptoms: penile swelling   Associated symptoms: no abdominal pain, no fever, no genital lesions, no scrotal swelling, no urinary retention and no vomiting   Risk factors: change in medication       Past Medical History:  Diagnosis Date   Depression    Sinus congestion     Patient Active Problem List   Diagnosis Date Noted   Drug abuse, marijuana 02/26/2021   Drug abuse, cocaine type (HCC) 02/26/2021   Depression 02/25/2021   Pneumonia 02/25/2021    Past Surgical History:  Procedure Laterality Date   CYSTOSCOPY/RETROGRADE/URETEROSCOPY Bilateral 08/15/2018   Procedure: CYSTOSCOPY/RETROGRADE URETHROGRAM  Karen Kays AND PERINEUM IRRIGATION AND DEBRIDEMENT;  Surgeon: Crist Fat, MD;  Location: [facility];  Service: Urology;  Laterality: Bilateral;       No family history on file.  Social History   Tobacco Use   Smoking status: Every Day    Packs/day: 1.00    Years: 30.00    Pack years: 30.00    Types: Cigarettes   Smokeless tobacco: Never  Vaping Use   Vaping Use: Never used  Substance Use Topics   Alcohol use: Yes     Alcohol/week: 2.0 standard drinks    Types: 1 Cans of beer, 1 Shots of liquor per week    Comment: occa   Drug use: Yes    Types: Marijuana    Comment: occassionally (last 05/23/18)    Home Medications Prior to Admission medications   Medication Sig Start Date End Date Taking? Authorizing Provider  azithromycin (ZITHROMAX) 250 MG tablet Take 1 tablet (250 mg total) by mouth daily. 02/28/21   Drema Dallas, MD  famotidine (PEPCID) 20 MG tablet Take 1 tablet (20 mg total) by mouth 2 (two) times daily. 02/28/21   Drema Dallas, MD  Ipratropium-Albuterol (COMBIVENT) 20-100 MCG/ACT AERS respimat Inhale 1 puff into the lungs every 6 (six) hours as needed for wheezing (SOB). 02/28/21   Drema Dallas, MD  nicotine (NICODERM CQ - DOSED IN MG/24 HOURS) 21 mg/24hr patch Place 1 patch (21 mg total) onto the skin daily. 03/01/21   Drema Dallas, MD  ondansetron (ZOFRAN) 4 MG tablet Take 1 tablet (4 mg total) by mouth every 6 (six) hours as needed for nausea. 02/28/21   Drema Dallas, MD  predniSONE (DELTASONE) 10 MG tablet Take 4 tablets (40 mg total) by mouth daily. 02/28/21   Drema Dallas, MD  zolpidem (AMBIEN) 5 MG tablet Take 1 tablet (5 mg total) by mouth at bedtime as needed for sleep. 02/28/21  Drema Dallas, MD    Allergies    Patient has no known allergies.  Review of Systems   Review of Systems  Constitutional:  Negative for fever.  Gastrointestinal:  Negative for abdominal pain and vomiting.  Genitourinary:  Positive for penile swelling. Negative for dysuria, penile discharge, penile pain and scrotal swelling.   Physical Exam Updated Vital Signs BP (!) 154/80 (BP Location: Right Arm)   Pulse (!) 102   Temp 98.9 F (37.2 C) (Oral)   Resp 18   Ht 5\' 7"  (1.702 m)   Wt 63.5 kg   SpO2 96%   BMI 21.93 kg/m   Physical Exam Vitals and nursing note reviewed.  Constitutional:      Appearance: Normal appearance. He is well-developed.  HENT:     Head: Normocephalic and atraumatic.   Eyes:     Conjunctiva/sclera: Conjunctivae normal.  Pulmonary:     Effort: Pulmonary effort is normal.  Abdominal:     Tenderness: There is no abdominal tenderness. There is no guarding or rebound.  Genitourinary:    Testes: Normal.     Comments: Patient has edema of his penis.  No scrotal edema no lesions appreciated.  Not having any urinary symptoms.  No evidence of balanitis. Musculoskeletal:     Cervical back: Neck supple.  Skin:    General: Skin is warm and dry.  Neurological:     General: No focal deficit present.     Mental Status: He is alert.     GCS: GCS eye subscore is 4. GCS verbal subscore is 5. GCS motor subscore is 6.     Gait: Gait normal.    ED Results / Procedures / Treatments   Labs (all labs ordered are listed, but only abnormal results are displayed) Labs Reviewed - No data to display  EKG None  Radiology No results found.  Procedures Procedures   Medications Ordered in ED Medications - No data to display  ED Course  I have reviewed the triage vital signs and the nursing notes.  Pertinent labs & imaging results that were available during my care of the patient were reviewed by me and considered in my medical decision making (see chart for details).    MDM Rules/Calculators/A&P                          60 year old male here with swelling of his penis.  No trauma.  He recently was admitted for COPD exacerbation placed on steroid taper.  Edema possibly related to this.  No evidence of balanitis or phimosis.  No urinary symptoms otherwise.  Recommended expectant management and follow-up with PCP.  Return instructions discussed Final Clinical Impression(s) / ED Diagnoses Final diagnoses:  Edema, penis    Rx / DC Orders ED Discharge Orders     None        66, MD 03/04/21 [phone]

## 2021-03-03 NOTE — Discharge Instructions (Addendum)
You were seen in the emergency department for evaluation of swelling of your penis.  Your exam did not show any serious causes of this.  It is likely related to the prednisone that you are on.  This should improve when you get off the prednisone.  Return to the emergency department if any worsening or concerning symptoms

## 2021-03-30 ENCOUNTER — Encounter (HOSPITAL_COMMUNITY): Payer: Self-pay | Admitting: Radiology

## 2021-05-09 ENCOUNTER — Ambulatory Visit: Payer: Self-pay | Admitting: Nurse Practitioner

## 2021-08-08 ENCOUNTER — Encounter (HOSPITAL_COMMUNITY): Payer: Self-pay

## 2021-08-08 ENCOUNTER — Other Ambulatory Visit: Payer: Self-pay

## 2021-08-08 ENCOUNTER — Emergency Department (HOSPITAL_COMMUNITY)
Admission: EM | Admit: 2021-08-08 | Discharge: 2021-08-09 | Disposition: A | Discharge status: Home / Self Care | Payer: Medicaid Other | Attending: Emergency Medicine | Admitting: Emergency Medicine

## 2021-08-08 ENCOUNTER — Emergency Department (HOSPITAL_COMMUNITY): Payer: Medicaid Other

## 2021-08-08 DIAGNOSIS — J849 Interstitial pulmonary disease, unspecified: Secondary | ICD-10-CM | POA: Diagnosis not present

## 2021-08-08 DIAGNOSIS — R0602 Shortness of breath: Secondary | ICD-10-CM | POA: Diagnosis present

## 2021-08-08 DIAGNOSIS — Z87891 Personal history of nicotine dependence: Secondary | ICD-10-CM | POA: Insufficient documentation

## 2021-08-08 DIAGNOSIS — R053 Chronic cough: Secondary | ICD-10-CM

## 2021-08-08 MED ORDER — ALBUTEROL SULFATE HFA 108 (90 BASE) MCG/ACT IN AERS
2.0000 | INHALATION_SPRAY | Freq: Once | RESPIRATORY_TRACT | Status: AC
  Administered 2021-08-08: 2 via RESPIRATORY_TRACT
  Filled 2021-08-08: qty 6.7

## 2021-08-08 NOTE — ED Provider Notes (Signed)
[facility] EMERGENCY DEPARTMENT Provider Note   CSN: [id number] Arrival date & time: 08/08/21  2127     History  Chief Complaint  Patient presents with   Shortness of Breath   Cough    Gregory Camacho is a 61 y.o. male.  HPI     This is a 61 year old male with a history significant for smoking who presents with cough.  Patient reports over 1 month history of ongoing cough.  He states that sometimes he coughs so much he has posttussive emesis.  This usually happens at night.  He no longer smokes.  He has not had any recent fevers.  Denies chest pain.  He has not had any sick contacts.  States that he had a coughing fit tonight which is why he called EMS.  Denies abdominal pain, nausea, change in bowels.  Home Medications Prior to Admission medications   Medication Sig Start Date End Date Taking? Authorizing Provider  azithromycin (ZITHROMAX) 250 MG tablet Take 1 tablet (250 mg total) by mouth daily. 02/28/21   Drema Dallas, MD  famotidine (PEPCID) 20 MG tablet Take 1 tablet (20 mg total) by mouth 2 (two) times daily. 02/28/21   Drema Dallas, MD  Ipratropium-Albuterol (COMBIVENT) 20-100 MCG/ACT AERS respimat Inhale 1 puff into the lungs every 6 (six) hours as needed for wheezing (SOB). 02/28/21   Drema Dallas, MD  nicotine (NICODERM CQ - DOSED IN MG/24 HOURS) 21 mg/24hr patch Place 1 patch (21 mg total) onto the skin daily. 03/01/21   Drema Dallas, MD  ondansetron (ZOFRAN) 4 MG tablet Take 1 tablet (4 mg total) by mouth every 6 (six) hours as needed for nausea. 02/28/21   Drema Dallas, MD  predniSONE (DELTASONE) 10 MG tablet Take 4 tablets (40 mg total) by mouth daily. 02/28/21   Drema Dallas, MD  zolpidem (AMBIEN) 5 MG tablet Take 1 tablet (5 mg total) by mouth at bedtime as needed for sleep. 02/28/21   Drema Dallas, MD      Allergies    Patient has no known allergies.    Review of Systems   Review of Systems  Constitutional:  Negative for fever.  Respiratory:   Positive for cough and shortness of breath.   Cardiovascular:  Negative for chest pain.  All other systems reviewed and are negative.  Physical Exam Updated Vital Signs BP 102/62    Pulse 85    Temp 99.1 F (37.3 C) (Oral)    Resp (!) 21    Ht 1.702 m (5\' 7" )    Wt 64 kg    SpO2 99%    BMI 22.10 kg/m  Physical Exam Vitals and nursing note reviewed.  Constitutional:      Appearance: He is well-developed. He is not ill-appearing.  HENT:     Head: Normocephalic and atraumatic.     Mouth/Throat:     Mouth: Mucous membranes are moist.  Eyes:     Pupils: Pupils are equal, round, and reactive to light.  Cardiovascular:     Rate and Rhythm: Normal rate and regular rhythm.     Heart sounds: Normal heart sounds. No murmur heard. Pulmonary:     Effort: Pulmonary effort is normal. No respiratory distress.     Breath sounds: Normal breath sounds. No wheezing.     Comments: Diminished breath sounds in all lung fields, equal air movement, no significant wheezing Abdominal:     General: Bowel sounds are normal.  Palpations: Abdomen is soft.     Tenderness: There is no abdominal tenderness. There is no rebound.  Musculoskeletal:     Cervical back: Neck supple.     Right lower leg: No edema.     Left lower leg: No edema.  Lymphadenopathy:     Cervical: No cervical adenopathy.  Skin:    General: Skin is warm and dry.  Neurological:     Mental Status: He is alert and oriented to person, place, and time.  Psychiatric:        Mood and Affect: Mood normal.    ED Results / Procedures / Treatments   Labs (all labs ordered are listed, but only abnormal results are displayed) Labs Reviewed - No data to display  EKG None  Radiology DG Chest 2 View  Result Date: 08/08/2021 CLINICAL DATA:  Shortness of breath, cough EXAM: CHEST - 2 VIEW COMPARISON:  02/25/2021 FINDINGS: Heart is normal size. Increased reticulonodular densities throughout the upper lobes, worsening since prior study. Favor  worsening chronic interstitial lung disease. No visible effusions. No acute bony abnormality. IMPRESSION: Reticulonodular interstitial prominence within the lungs with an upper lobe predominance, worsening since prior study. Favor worsening chronic interstitial lung disease. Electronically Signed   By: Charlett Nose M.D.   On: 08/08/2021 22:00    Procedures Procedures    Medications Ordered in ED Medications  dexamethasone (DECADRON) injection 10 mg (has no administration in time range)  albuterol (VENTOLIN HFA) 108 (90 Base) MCG/ACT inhaler 2 puff (2 puffs Inhalation Given 08/08/21 2354)    ED Course/ Medical Decision Making/ A&P                           Medical Decision Making Amount and/or Complexity of Data Reviewed Radiology: ordered.  Risk Prescription drug management.   This patient presents to the ED for concern of cough, this involves an extensive number of treatment options, and is a complaint that carries with it a high risk of complications and morbidity.  The differential diagnosis includes chronic cough, COPD, pneumonia  MDM:    This is a 61 year old former smoker who presents with cough.  Cough is subacute to chronic in nature.  He is nontoxic.  Vital signs reassuring.  He is in no respiratory distress.  Satting 92 to 95% on room air.  He has diminished breath sounds but no active wheezing.  He denies recent fevers or illnesses.  Chest x-ray does not show any evidence of pneumonia or pneumothorax.  It does indicate likely worsening interstitial lung disease.  He would be at risk given his age and smoking history.  He was able to ambulate maintain his pulse ox 92 to 97% without any increased work of breathing or respiratory distress.  He was given an inhaler as he states that this sometimes helps him.  Feel he would benefit from pulmonology follow-up.  I have low suspicion at this time for PE or other cause of shortness of breath given that his primary complaint is  cough. (Labs, imaging)  Labs: I Ordered, and personally interpreted labs.  The pertinent results include: None  Imaging Studies ordered: I ordered imaging studies including x-ray I independently visualized and interpreted imaging. I agree with the radiologist interpretation  Additional history obtained from chart review.  External records from outside source obtained and reviewed including prior ED visit  Critical Interventions: Albuterol, IM Decadron  Consultations: I requested consultation with the none,  and discussed  lab and imaging findings as well as pertinent plan - they recommend: None  Cardiac Monitoring: The patient was maintained on a cardiac monitor.  I personally viewed and interpreted the cardiac monitored which showed an underlying rhythm of: Normal sinus rhythm  Reevaluation: After the interventions noted above, I reevaluated the patient and found that they have :improved   Considered admission for: N/A  Social Determinants of Health: Lives independently  Disposition: Discharged with pulmonology follow-up  Co morbidities that complicate the patient evaluation  Past Medical History:  Diagnosis Date   Depression    Sinus congestion      Medicines Meds ordered this encounter  Medications   albuterol (VENTOLIN HFA) 108 (90 Base) MCG/ACT inhaler 2 puff   dexamethasone (DECADRON) injection 10 mg    I have reviewed the patients home medicines and have made adjustments as needed  Problem List / ED Course: Problem List Items Addressed This Visit   None Visit Diagnoses     Chronic cough    -  Primary   Interstitial lung disease (HCC)                      Final Clinical Impression(s) / ED Diagnoses Final diagnoses:  Chronic cough  Interstitial lung disease (HCC)    Rx / DC Orders ED Discharge Orders     None         Roby Spalla, Mayer Masker, MD 08/09/21 0015

## 2021-08-08 NOTE — ED Triage Notes (Addendum)
Pt BIB [facility]. Pt states he has been coughing up phlegm for the last month that makes him vomit. States he had pneumonia a few months ago and is worried he has it again.  EMS states he was 92 on RA, placed on 2LNC now at 98% in triage

## 2021-08-09 MED ORDER — DEXAMETHASONE SODIUM PHOSPHATE 10 MG/ML IJ SOLN
10.0000 mg | Freq: Once | INTRAMUSCULAR | Status: AC
  Administered 2021-08-09: 10 mg via INTRAMUSCULAR
  Filled 2021-08-09: qty 1

## 2021-08-09 NOTE — Discharge Instructions (Addendum)
You were seen today for ongoing cough.  Your chest x-ray does not show any evidence of pneumonia although does indicate that you may have chronic lung disease.  Follow-up with pulmonology.  Use your inhaler as needed.

## 2021-08-09 NOTE — ED Notes (Signed)
Pt ambulated without incident. Pt oxygen saturation remained from 93%-97% on room air. Pt states that he feels great walking around and denies any pain or SOB.

## 2021-08-11 ENCOUNTER — Encounter: Payer: Self-pay | Admitting: Internal Medicine

## 2021-08-11 ENCOUNTER — Telehealth: Payer: Self-pay

## 2021-08-11 ENCOUNTER — Ambulatory Visit (INDEPENDENT_AMBULATORY_CARE_PROVIDER_SITE_OTHER): Payer: Medicaid Other | Admitting: Internal Medicine

## 2021-08-11 ENCOUNTER — Other Ambulatory Visit: Payer: Self-pay

## 2021-08-11 DIAGNOSIS — R053 Chronic cough: Secondary | ICD-10-CM

## 2021-08-11 MED ORDER — FAMOTIDINE 20 MG PO TABS: ORAL_TABLET | ORAL | 11 refills | Status: DC

## 2021-08-11 MED ORDER — PANTOPRAZOLE SODIUM 40 MG PO TBEC: 40.0000 mg | DELAYED_RELEASE_TABLET | Freq: Every day | ORAL | 2 refills | Status: DC

## 2021-08-11 MED ORDER — METHYLPREDNISOLONE ACETATE 80 MG/ML IJ SUSP
80.0000 mg | Freq: Once | INTRAMUSCULAR | Status: AC
  Administered 2021-08-11: 80 mg via INTRAMUSCULAR

## 2021-08-11 NOTE — Telephone Encounter (Signed)
Error

## 2021-08-11 NOTE — Patient Instructions (Addendum)
Pantoprazole (protonix) 40 mg   Take  30-60 min before first meal of the day and Pepcid (famotidine)  20 mg after supper until return to office - this is the best way to tell whether stomach acid is contributing to your problem.  For cough > mucinex dm 1200 mg every 12 hours (over the counter)   GERD (REFLUX)  is an extremely common cause of respiratory symptoms just like yours , many times with no obvious heartburn at all.    It can be treated with medication, but also with lifestyle changes including elevation of the head of your bed (ideally with 6 -8inch blocks under the headboard of your bed),  Smoking cessation, avoidance of late meals, excessive alcohol, and avoid fatty foods, chocolate, peppermint, colas, red wine, and acidic juices such as orange juice.  NO MINT OR MENTHOL PRODUCTS SO NO COUGH DROPS  USE SUGARLESS CANDY INSTEAD (Jolley ranchers or Stover's or Life Savers) or even ice chips will also do - the key is to swallow to prevent all throat clearing. NO OIL BASED VITAMINS - use powdered substitutes.  Avoid fish oil when coughing.     Depomedrol 80mg  IM   Please remember to go to the lab department   for your tests - we will call you with the results when they are available.      We will schedule a CT of your chest and call the results    Please schedule a follow up office visit in 4 weeks, sooner if needed with all medications in hand

## 2021-08-11 NOTE — Assessment & Plan Note (Addendum)
Onset ? 05/2021 with bilateral RN changes mostly in upper lobes 08/08/21  - Quant TB 08/11/2021  Neg  - ACE  08/11/2021 34  ddx is broad in pt with h/o drug abuse with cocaine or inhalent pneumonitis,   HIV with opportunistic infection( but screen was negative in 02/2021 )   On review of chart and probably needs to be repeated p start the w/u with the above labs in anticipation of likely needs TBBX to sort out.   >>> HRCT next step then regroup.    Discussed in detail all the  indications, usual  risks and alternatives  relative to the benefits with patient who agrees to proceed with w/u as outlined.            Each maintenance medication was reviewed in detail including emphasizing most importantly the difference between maintenance and prns and under what circumstances the prns are to be triggered using an action plan format where appropriate.  Total time for H and P, chart review, counseling,   and generating customized AVS unique to this office visit / same day charting > 60 min

## 2021-08-11 NOTE — Progress Notes (Signed)
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  Will adjust antibiotics if we obtain positive sputum culture. - DuoNeb QID -Solu-Medrol 60 mg BID - Expiratory spirometry - Flutter valve   Depression -Not on home meds   Nicotine abuse - Nicotine patch   Drug abuse - 9/2 urine rapid drug screen positive cocaine and marijuana -9/3 Counseling of resources available to stop use of marijuana and cocaine   Goals of care - 9/4 consult to LCSW: Patient requests to obtain a list of PCPs accepting new patients in town prior to discharge most likely in the [place] NOTE: Also would be of benefit to have list of resources to help in discontinuing use of illegal substances       History of Present Illness  08/11/2021  Pulmonary/ 1st office eval/ Gregory Camacho / [name]  Chief Complaint  Patient presents with   Consult    Chronic productive cough X approx.  1 month    Dyspnea:   limited by back not breathing  Cough: more when lie down assoc with watery pnds x 20 years > mucoid more than purulent  Sleep: on side / sleeps on couch  SABA use:  not helping   No obvious day to day or daytime variability or assoc  mucus plugs or hemoptysis or cp or chest tightness, subjective wheeze or overt sinus or hb symptoms.    Also denies any obvious fluctuation of symptoms with weather or environmental changes or other aggravating or alleviating factors except as outlined above   No unusual  exposure hx or h/o childhood pna/ asthma or knowledge of premature birth.  Current Allergies, Complete Past Medical History, Past Surgical History, Family History, and Social History were reviewed in [employer] record.  ROS  The following are not active complaints unless bolded Hoarseness, sore throat, dysphagia, dental problems, itching, sneezing,  nasal congestion or discharge of excess mucus or purulent secretions, ear ache,   fever, chills, sweats, unintended wt loss or wt gain, classically pleuritic or exertional cp,  orthopnea pnd or arm/hand swelling  or leg swelling, presyncope, palpitations, abdominal pain, anorexia, nausea, vomiting, diarrhea  or change in bowel habits or change in bladder habits, change in stools or change in urine, dysuria, hematuria,  rash, arthralgias, visual complaints, headache, numbness, weakness or ataxia or problems with walking or coordination,  change in mood or  memory.             Past Medical History:  Diagnosis Date   Depression    Sinus congestion     Outpatient Medications Prior to Visit -  - NOTE:   Unable to verify as accurately reflecting what pt takes    Medication Sig Dispense Refill   azithromycin (ZITHROMAX) 250 MG tablet Take 1 tablet (250 mg total) by mouth daily. 4 each 0   famotidine (PEPCID) 20 MG tablet Take 1 tablet (20 mg total)  by mouth 2 (two) times daily. 10 tablet 0   Ipratropium-Albuterol (COMBIVENT) 20-100 MCG/ACT AERS respimat Inhale 1 puff into the lungs every 6 (six) hours as needed for wheezing (SOB). 4 g 0   nicotine (NICODERM CQ - DOSED IN MG/24 HOURS) 21 mg/24hr patch Place 1 patch (21 mg total) onto the skin daily. 28 patch 0   ondansetron (ZOFRAN) 4 MG tablet Take 1 tablet (4 mg total) by mouth every 6 (six) hours as needed for nausea. 10 tablet 0   predniSONE (DELTASONE) 10 MG tablet Take 4 tablets (40 mg total) by mouth daily. 16 tablet 0   zolpidem (AMBIEN) 5 MG tablet Take 1 tablet (5 mg  total) by mouth at bedtime as needed for sleep. 10 tablet 0   No facility-administered medications prior to visit.     Objective:     BP 134/80 (BP Location: Left Arm, Patient Position: Sitting)    Pulse (!) 104    Temp 98 F (36.7 C) (Temporal)    Ht 5\' 8"  (1.727 m)    Wt 121 lb (54.9 kg)    SpO2 97% Comment: ra   BMI 18.40 kg/m   SpO2: 97 % (ra)  Wt Readings from Last 3 Encounters:  08/11/21 121 lb (54.9 kg)  08/08/21 141 lb 1.5 oz (64 kg)  03/03/21 140 lb (63.5 kg)      Vital signs reviewed  08/11/2021  - Note at rest 02 sats  97% on RA   General appearance:    chronically ill appearing amb bm/ min spont cough   HEENT : pt wearing mask not removed for exam due to covid - 19 concerns.   NECK :  without JVD/Nodes/TM/ nl carotid upstrokes bilaterally   LUNGS: no acc muscle use,  Min barrel  contour chest wall with bilateral  slightly decreased bs s audible wheeze and  without cough on insp or exp maneuvers and min  Hyperresonant  to  percussion bilaterally     CV:  RRR  no s3 or murmur or increase in P2, and no edema   ABD:  soft and nontender with pos end  insp Hoover's  in the supine position. No bruits or organomegaly appreciated, bowel sounds nl  MS:   Nl gait/  ext warm without deformities, calf tenderness, cyanosis or clubbing No obvious joint restrictions   SKIN: warm and dry without lesions    NEURO:  alert, approp, nl sensorium with  no motor or cerebellar deficits apparent.        I personally reviewed images and agree with radiology impression as follows:  CXR:   pa and lateral 08/08/21 Reticulonodular interstitial prominence within the lungs with an upper lobe predominance, worsening since prior study. Favor worsening chronic interstitial lung disease.  Labs ordered/ reviewed:      Chemistry      Component Value Date/Time   NA 139 02/28/2021 0501   K 3.9 02/28/2021 0501   CL 107 02/28/2021 0501   CO2 26 02/28/2021 0501   BUN 16 02/28/2021 0501    CREATININE 0.71 02/28/2021 0501      Component Value Date/Time   CALCIUM 8.2 (L) 02/28/2021 0501   ALKPHOS 63 02/28/2021 0501   AST 59 (H) 02/28/2021 0501   ALT 76 (H) 02/28/2021 0501   BILITOT 0.3 02/28/2021 0501        Lab Results  Component Value Date   WBC 17.8 (H) 08/15/2021   HGB 11.7 (L) 08/15/2021   HCT 36.3 (  L) 08/15/2021   MCV 79 08/15/2021   PLT 714 (H) 08/15/2021        EOS                                                             0.4                                     08/15/21       Lab Results  Component Value Date   ESRSEDRATE 86 (H) 08/15/2021   ESRSEDRATE 82 (H) 08/11/2021               Assessment   No problem-specific Assessment & Plan notes found for this encounter.     Christinia Gully, MD 08/11/2021

## 2021-08-15 ENCOUNTER — Telehealth: Payer: Self-pay | Admitting: Internal Medicine

## 2021-08-15 DIAGNOSIS — R053 Chronic cough: Secondary | ICD-10-CM

## 2021-08-15 NOTE — Telephone Encounter (Signed)
I did not complete his eval from last visit as I don't have the labs he said he would go to [facility] elsewhere to obtain and they need to be done today if he wants my help  Also will need a sputum sample today so we can target the right organisim needs to be for gm stain and culture and afb stain and culture and fungal stain and culture.

## 2021-08-15 NOTE — Telephone Encounter (Signed)
Called and informed patient of recs. He voiced understanding. Nothing further needed. Order for sputum sample placed.

## 2021-08-15 NOTE — Telephone Encounter (Signed)
Primary Pulmonologist: Dr. Melvyn Novas  Last office visit and with whom: 08/11/2021 Wert What do we see them for (pulmonary problems): Chronic cough   Last OV assessment/plan: see below  Was appointment offered to patient (explain)?  No none available    Reason for call: Patient states cough has not  improved since last OV on 08/11/2021. He states he was worse over the weekend and he is now coughing up a darker colored mucus than before. States the depomedrol shot did not help him and he is coughing so much he has a headache. Has tried mucinex, protonix, and pepcid  Would like to know what other medications he can try for cough.   No Known Allergies   There is no immunization history on file for this patient.    Assessment & Plan Note by Tanda Rockers, MD at 08/11/2021 3:46 PM  Author: Tanda Rockers, MD Author Type: Physician Filed: 08/11/2021  3:48 PM  Note Status: Written Cosign: Cosign Not Required Encounter Date: 08/11/2021  Problem: Chronic cough  Editor: Tanda Rockers, MD (Physician)             Onset ? 05/2021 with bilateral RN changes mostly in upper lobes 08/08/21  - Quant TB 08/11/2021  - ESR 08/11/2021  - ACE  08/11/2021    ddx is broad in pt with h/o drug abuse including HIV but this was negative in 02/2021 w/u  On review of chart and probably needs to be repeated p start the w/u with the above labs in anticipation of likely needs TBBX to sort out.         Patient Instructions by Tanda Rockers, MD at 08/11/2021 3:00 PM  Author: Tanda Rockers, MD Author Type: Physician Filed: 08/11/2021  3:22 PM  Note Status: Addendum Cosign: Cosign Not Required Encounter Date: 08/11/2021  Editor: Tanda Rockers, MD (Physician)      Prior Versions: 1. Tanda Rockers, MD (Physician) at 08/11/2021  3:18 PM - Signed  Pantoprazole (protonix) 40 mg   Take  30-60 min before first meal of the day and Pepcid (famotidine)  20 mg after supper until return to office - this is the best way to tell  whether stomach acid is contributing to your problem.   For cough > mucinex dm 1200 mg every 12 hours (over the counter)    GERD (REFLUX)  is an extremely common cause of respiratory symptoms just like yours , many times with no obvious heartburn at all.     It can be treated with medication, but also with lifestyle changes including elevation of the head of your bed (ideally with 6 -8inch blocks under the headboard of your bed),  Smoking cessation, avoidance of late meals, excessive alcohol, and avoid fatty foods, chocolate, peppermint, colas, red wine, and acidic juices such as orange juice.  NO MINT OR MENTHOL PRODUCTS SO NO COUGH DROPS  USE SUGARLESS CANDY INSTEAD (Jolley ranchers or Stover's or Life Savers) or even ice chips will also do - the key is to swallow to prevent all throat clearing. NO OIL BASED VITAMINS - use powdered substitutes.  Avoid fish oil when coughing.      Depomedrol 28m IM    Please remember to go to the lab department   for your tests - we will call you with the results when they are available.       We will schedule a CT of your chest and call the results  Please schedule a follow up office visit in 4 weeks, sooner if needed with all medications in hand

## 2021-08-17 LAB — CBC WITH DIFFERENTIAL/PLATELET
Basophils Absolute: 0.1 10*3/uL (ref 0.0–0.2)
Basos: 1 %
EOS (ABSOLUTE): 0.5 10*3/uL — ABNORMAL HIGH (ref 0.0–0.4)
Eos: 3 %
Hematocrit: 32.7 % — ABNORMAL LOW (ref 37.5–51.0)
Hemoglobin: 10.7 g/dL — ABNORMAL LOW (ref 13.0–17.7)
Immature Grans (Abs): 0.1 10*3/uL (ref 0.0–0.1)
Immature Granulocytes: 1 %
Lymphocytes Absolute: 2.3 10*3/uL (ref 0.7–3.1)
Lymphs: 12 %
MCH: 25.6 pg — ABNORMAL LOW (ref 26.6–33.0)
MCHC: 32.7 g/dL (ref 31.5–35.7)
MCV: 78 fL — ABNORMAL LOW (ref 79–97)
Monocytes Absolute: 1.9 10*3/uL — ABNORMAL HIGH (ref 0.1–0.9)
Monocytes: 10 %
Neutrophils Absolute: 15.1 10*3/uL — ABNORMAL HIGH (ref 1.4–7.0)
Neutrophils: 73 %
Platelets: 621 10*3/uL — ABNORMAL HIGH (ref 150–450)
RBC: 4.18 x10E6/uL (ref 4.14–5.80)
RDW: 12.9 % (ref 11.6–15.4)
WBC: 20 10*3/uL (ref 3.4–10.8)

## 2021-08-17 LAB — IGE: IgE (Immunoglobulin E), Serum: 244 IU/mL (ref 6–495)

## 2021-08-17 LAB — QUANTIFERON-TB GOLD PLUS
QuantiFERON Mitogen Value: 4.97 IU/mL
QuantiFERON Nil Value: 0.04 IU/mL
QuantiFERON TB1 Ag Value: 0.06 IU/mL
QuantiFERON TB2 Ag Value: 0.05 IU/mL
QuantiFERON-TB Gold Plus: NEGATIVE

## 2021-08-17 LAB — ANGIOTENSIN CONVERTING ENZYME: Angio Convert Enzyme: 30 U/L (ref 14–82)

## 2021-08-17 LAB — SEDIMENTATION RATE: Sed Rate: 82 mm/hr — ABNORMAL HIGH (ref 0–30)

## 2021-08-20 LAB — CBC WITH DIFFERENTIAL/PLATELET
Basophils Absolute: 0.1 10*3/uL (ref 0.0–0.2)
Basos: 1 %
EOS (ABSOLUTE): 0.4 10*3/uL (ref 0.0–0.4)
Eos: 2 %
Hematocrit: 36.3 % — ABNORMAL LOW (ref 37.5–51.0)
Hemoglobin: 11.7 g/dL — ABNORMAL LOW (ref 13.0–17.7)
Immature Grans (Abs): 0.2 10*3/uL — ABNORMAL HIGH (ref 0.0–0.1)
Immature Granulocytes: 1 %
Lymphocytes Absolute: 2.1 10*3/uL (ref 0.7–3.1)
Lymphs: 12 %
MCH: 25.5 pg — ABNORMAL LOW (ref 26.6–33.0)
MCHC: 32.2 g/dL (ref 31.5–35.7)
MCV: 79 fL (ref 79–97)
Monocytes Absolute: 1.9 10*3/uL — ABNORMAL HIGH (ref 0.1–0.9)
Monocytes: 11 %
Neutrophils Absolute: 13.1 10*3/uL — ABNORMAL HIGH (ref 1.4–7.0)
Neutrophils: 73 %
Platelets: 714 10*3/uL — ABNORMAL HIGH (ref 150–450)
RBC: 4.59 x10E6/uL (ref 4.14–5.80)
RDW: 13 % (ref 11.6–15.4)
WBC: 17.8 10*3/uL — ABNORMAL HIGH (ref 3.4–10.8)

## 2021-08-20 LAB — SEDIMENTATION RATE: Sed Rate: 86 mm/hr — ABNORMAL HIGH (ref 0–30)

## 2021-08-20 LAB — QUANTIFERON-TB GOLD PLUS
QuantiFERON Mitogen Value: 3.01 IU/mL
QuantiFERON Nil Value: 0.02 IU/mL
QuantiFERON TB1 Ag Value: 0.05 IU/mL
QuantiFERON TB2 Ag Value: 0.03 IU/mL
QuantiFERON-TB Gold Plus: NEGATIVE

## 2021-08-20 LAB — SPUTUM CULTURE

## 2021-08-20 LAB — GRAM STAIN W/SPUTUM CULT RFLX

## 2021-08-20 LAB — IGE: IgE (Immunoglobulin E), Serum: 246 IU/mL (ref 6–495)

## 2021-08-20 LAB — ANGIOTENSIN CONVERTING ENZYME: Angio Convert Enzyme: 34 U/L (ref 14–82)

## 2021-08-23 ENCOUNTER — Emergency Department (HOSPITAL_COMMUNITY)
Admission: EM | Admit: 2021-08-23 | Discharge: 2021-08-23 | Disposition: A | Discharge status: Home / Self Care | Payer: Medicaid Other | Attending: Emergency Medicine | Admitting: Emergency Medicine

## 2021-08-23 ENCOUNTER — Emergency Department (HOSPITAL_COMMUNITY): Payer: Medicaid Other

## 2021-08-23 ENCOUNTER — Encounter (HOSPITAL_COMMUNITY): Payer: Self-pay | Admitting: Emergency Medicine

## 2021-08-23 ENCOUNTER — Other Ambulatory Visit: Payer: Self-pay

## 2021-08-23 DIAGNOSIS — R0602 Shortness of breath: Secondary | ICD-10-CM | POA: Diagnosis not present

## 2021-08-23 DIAGNOSIS — R059 Cough, unspecified: Secondary | ICD-10-CM | POA: Insufficient documentation

## 2021-08-23 DIAGNOSIS — J189 Pneumonia, unspecified organism: Secondary | ICD-10-CM

## 2021-08-23 MED ORDER — IPRATROPIUM-ALBUTEROL 0.5-2.5 (3) MG/3ML IN SOLN
3.0000 mL | Freq: Once | RESPIRATORY_TRACT | Status: AC
  Administered 2021-08-23: 3 mL via RESPIRATORY_TRACT
  Filled 2021-08-23: qty 3

## 2021-08-23 MED ORDER — PREDNISONE 50 MG PO TABS
60.0000 mg | ORAL_TABLET | Freq: Once | ORAL | Status: AC
  Administered 2021-08-23: 60 mg via ORAL
  Filled 2021-08-23: qty 1

## 2021-08-23 MED ORDER — ALBUTEROL SULFATE HFA 108 (90 BASE) MCG/ACT IN AERS
2.0000 | INHALATION_SPRAY | RESPIRATORY_TRACT | Status: DC | PRN
  Administered 2021-08-23: 2 via RESPIRATORY_TRACT
  Filled 2021-08-23: qty 6.7

## 2021-08-23 MED ORDER — PREDNISONE 20 MG PO TABS: ORAL_TABLET | ORAL | 0 refills | Status: DC

## 2021-08-23 MED ORDER — AZITHROMYCIN 250 MG PO TABS
500.0000 mg | ORAL_TABLET | Freq: Once | ORAL | Status: AC
  Administered 2021-08-23: 500 mg via ORAL
  Filled 2021-08-23: qty 2

## 2021-08-23 MED ORDER — AZITHROMYCIN 250 MG PO TABS: 250.0000 mg | ORAL_TABLET | Freq: Every day | ORAL | 0 refills | Status: DC

## 2021-08-23 NOTE — ED Notes (Signed)
Went over d/c papers. Gave inhaler. Verbalized understanding. All questions answered.

## 2021-08-23 NOTE — ED Triage Notes (Signed)
Pt c/o sob x 2 months.

## 2021-08-23 NOTE — ED Provider Notes (Signed)
Gregory Camacho   CSN: [id number] Arrival date & time: 08/23/21  0108     History  Chief Complaint  Patient presents with   Shortness of Breath    Gregory Camacho is a 61 y.o. male.  Patient is a 61 year old male with past medical history of chronic cough, prior pneumonia.  Patient presenting today with complaints of cough and shortness of breath.  This has been worsening over the past 2 weeks.  Patient seen here 2 weeks ago and prescribed a steroid and inhaler, but achieved little relief.  He returns with ongoing symptoms and now productive cough.  He denies fevers or chills.  He denies to me he is having chest pain.  The history is provided by the patient.  Shortness of Breath Severity:  Moderate Onset quality:  Gradual Duration:  2 weeks Timing:  Constant Progression:  Worsening Chronicity:  New Relieved by:  Nothing Worsened by:  Nothing Ineffective treatments:  Inhaler (Decadron)     Home Medications Prior to Admission medications   Medication Sig Start Date End Date Taking? Authorizing Provider  famotidine (PEPCID) 20 MG tablet One after supper 08/11/21   Nyoka Cowden, MD  nicotine (NICODERM CQ - DOSED IN MG/24 HOURS) 21 mg/24hr patch Place 1 patch (21 mg total) onto the skin daily. 03/01/21   Drema Dallas, MD  ondansetron (ZOFRAN) 4 MG tablet Take 1 tablet (4 mg total) by mouth every 6 (six) hours as needed for nausea. 02/28/21   Drema Dallas, MD  pantoprazole (PROTONIX) 40 MG tablet Take 1 tablet (40 mg total) by mouth daily. Take 30-60 min before first meal of the day 08/11/21   Nyoka Cowden, MD  zolpidem (AMBIEN) 5 MG tablet Take 1 tablet (5 mg total) by mouth at bedtime as needed for sleep. 02/28/21   Drema Dallas, MD      Allergies    Patient has no known allergies.    Review of Systems   Review of Systems  Respiratory:  Positive for shortness of breath.   All other systems reviewed and are negative.  Physical  Exam Updated Vital Signs BP 105/71    Pulse (!) 104    Temp 98.9 F (37.2 C) (Oral)    Resp 18    Ht 5\' 8"  (1.727 m)    Wt 54.9 kg    SpO2 90%    BMI 18.40 kg/m  Physical Exam Vitals and nursing Camacho reviewed.  Constitutional:      General: He is not in acute distress.    Appearance: He is well-developed. He is not diaphoretic.  HENT:     Head: Normocephalic and atraumatic.  Cardiovascular:     Rate and Rhythm: Normal rate and regular rhythm.     Heart sounds: No murmur heard.   No friction rub.  Pulmonary:     Effort: Pulmonary effort is normal. No respiratory distress.     Breath sounds: Normal breath sounds. No wheezing or rales.  Abdominal:     General: Bowel sounds are normal. There is no distension.     Palpations: Abdomen is soft.     Tenderness: There is no abdominal tenderness.  Musculoskeletal:        General: Normal range of motion.     Cervical back: Normal range of motion and neck supple.  Skin:    General: Skin is warm and dry.  Neurological:     Mental Status: He is alert and  oriented to person, place, and time.     Coordination: Coordination normal.    ED Results / Procedures / Treatments   Labs (all labs ordered are listed, but only abnormal results are displayed) Labs Reviewed - No data to display  EKG None  Radiology DG Chest 2 View  Result Date: 08/23/2021 CLINICAL DATA:  Dyspnea EXAM: CHEST - 2 VIEW COMPARISON:  08/08/2021 FINDINGS: The lungs are symmetrically mildly hyperinflated in keeping with changes of underlying COPD. There has developed extensive diffuse slightly nodular pulmonary infiltrate, more focal within the lower lobes bilaterally, likely infectious or inflammatory as can be seen with atypical, including viral, pneumonia. No pneumothorax or pleural effusion. Cardiac size within normal limits. No acute bone abnormality. IMPRESSION: Marked interval progression of diffuse pulmonary infiltrate, more prevalent within the lower lung zones,  likely infectious or inflammatory. COPD. Electronically Signed   By: Helyn Numbers M.D.   On: 08/23/2021 02:07    Procedures Procedures    Medications Ordered in ED Medications  albuterol (VENTOLIN HFA) 108 (90 Base) MCG/ACT inhaler 2 puff (has no administration in time range)  predniSONE (DELTASONE) tablet 60 mg (has no administration in time range)  ipratropium-albuterol (DUONEB) 0.5-2.5 (3) MG/3ML nebulizer solution 3 mL (has no administration in time range)  azithromycin (ZITHROMAX) tablet 500 mg (has no administration in time range)    ED Course/ Medical Decision Making/ A&P  Patient presenting with a 2-week history of persistent cough and worsening breathing.  He was prescribed steroids and albuterol 2 weeks ago, but seems to be worsening.  Patient's chest x-ray today shows interval worsening of what appeared to be bibasilar infiltrates.  I have feel as though an antibiotic is indicated.  Patient will be treated with an additional course of steroids, Zithromax, and continued use of his inhaler.  He was given a DuoNeb here and seems to be feeling better.  Final Clinical Impression(s) / ED Diagnoses Final diagnoses:  None    Rx / DC Orders ED Discharge Orders     None         Geoffery Lyons, MD 08/23/21 [phone]

## 2021-08-23 NOTE — Discharge Instructions (Signed)
Begin taking Zithromax and prednisone as prescribed.  Use your inhaler, 2 puffs every 4 hours as needed.  Follow-up with primary doctor if not improving in the next few days.

## 2021-08-27 ENCOUNTER — Other Ambulatory Visit: Payer: Self-pay

## 2021-08-27 ENCOUNTER — Emergency Department (HOSPITAL_COMMUNITY)
Admission: EM | Admit: 2021-08-27 | Discharge: 2021-08-27 | Disposition: A | Discharge status: Home / Self Care | Payer: Medicaid Other | Attending: Emergency Medicine | Admitting: Emergency Medicine

## 2021-08-27 ENCOUNTER — Encounter (HOSPITAL_COMMUNITY): Payer: Self-pay | Admitting: Emergency Medicine

## 2021-08-27 DIAGNOSIS — J45909 Unspecified asthma, uncomplicated: Secondary | ICD-10-CM | POA: Diagnosis not present

## 2021-08-27 DIAGNOSIS — J4541 Moderate persistent asthma with (acute) exacerbation: Secondary | ICD-10-CM

## 2021-08-27 DIAGNOSIS — R0602 Shortness of breath: Secondary | ICD-10-CM | POA: Diagnosis present

## 2021-08-27 MED ORDER — ALBUTEROL (5 MG/ML) CONTINUOUS INHALATION SOLN: 10.0000 mg/h | INHALATION_SOLUTION | RESPIRATORY_TRACT | Status: DC

## 2021-08-27 MED ORDER — ALBUTEROL SULFATE (2.5 MG/3ML) 0.083% IN NEBU
INHALATION_SOLUTION | RESPIRATORY_TRACT | Status: AC
  Administered 2021-08-27: 10 mg
  Filled 2021-08-27: qty 12

## 2021-08-27 MED ORDER — ALBUTEROL SULFATE (2.5 MG/3ML) 0.083% IN NEBU: 2.5000 mg | INHALATION_SOLUTION | Freq: Four times a day (QID) | RESPIRATORY_TRACT | 1 refills | Status: DC | PRN

## 2021-08-27 MED ORDER — DEXAMETHASONE SODIUM PHOSPHATE 10 MG/ML IJ SOLN
10.0000 mg | Freq: Once | INTRAMUSCULAR | Status: AC
  Administered 2021-08-27: 10 mg via INTRAMUSCULAR
  Filled 2021-08-27: qty 1

## 2021-08-27 NOTE — ED Triage Notes (Addendum)
Pt c/o chronic SOB. States "never gets better". Pt states he has to sleep upright or he feels like he loses oxygen.  ?

## 2021-08-27 NOTE — Discharge Instructions (Signed)
Begin doing albuterol nebulizer treatments every 4 hours as needed. ? ?Follow-up with your primary doctor if not improving. ?

## 2021-08-27 NOTE — ED Provider Notes (Signed)
?[name] ?Provider Note ? ? ?CSN: [id number] ?Arrival date & time: 08/27/21  0100 ? ?  ? ?History ? ?Chief Complaint  ?Patient presents with  ? Shortness of Breath  ? ? ?Gregory Camacho is a 61 y.o. male. ? ?Patient is a 61 year old male with past medical history of asthma.  Patient presenting today with complaints of wheezing and shortness of breath.  This has been ongoing for the past 2 weeks.  Patient seen here several nights ago with similar complaints.  He was started on a steroid taper, Zithromax, and continued use of his inhaler, however is not improving.  He denies fevers or chills.  He denies any chest pain. ? ?The history is provided by the patient.  ?Shortness of Breath ?Severity:  Moderate ?Onset quality:  Gradual ?Duration:  2 weeks ?Timing:  Constant ?Progression:  Worsening ?Chronicity:  New ?Relieved by:  Nothing ?Worsened by:  Nothing ? ?  ? ?Home Medications ?Prior to Admission medications   ?Medication Sig Start Date End Date Taking? Authorizing Provider  ?azithromycin (ZITHROMAX) 250 MG tablet Take 1 tablet (250 mg total) by mouth daily. 08/23/21   Geoffery Lyons, MD  ?famotidine (PEPCID) 20 MG tablet One after supper 08/11/21   Nyoka Cowden, MD  ?nicotine (NICODERM CQ - DOSED IN MG/24 HOURS) 21 mg/24hr patch Place 1 patch (21 mg total) onto the skin daily. 03/01/21   Drema Dallas, MD  ?ondansetron (ZOFRAN) 4 MG tablet Take 1 tablet (4 mg total) by mouth every 6 (six) hours as needed for nausea. 02/28/21   Drema Dallas, MD  ?pantoprazole (PROTONIX) 40 MG tablet Take 1 tablet (40 mg total) by mouth daily. Take 30-60 min before first meal of the day 08/11/21   Nyoka Cowden, MD  ?predniSONE (DELTASONE) 20 MG tablet 3 Tabs PO Days 1-3, then 2 tabs PO Days 4-6, then 1 tab PO Day 7-9, then Half Tab PO Day 10-12 08/23/21   Geoffery Lyons, MD  ?zolpidem (AMBIEN) 5 MG tablet Take 1 tablet (5 mg total) by mouth at bedtime as needed for sleep. 02/28/21   Drema Dallas, MD  ?    ? ?Allergies    ?Patient has no known allergies.   ? ?Review of Systems   ?Review of Systems  ?Respiratory:  Positive for shortness of breath.   ?All other systems reviewed and are negative. ? ?Physical Exam ?Updated Vital Signs ?BP (!) 163/98 (BP Location: Right Arm)   Pulse 100   Temp 98 ?F (36.7 ?C) (Oral)   Resp 16   Ht 5\' 8"  (1.727 m)   Wt 55 kg   SpO2 92%   BMI 18.44 kg/m?  ?Physical Exam ?Vitals and nursing note reviewed.  ?Constitutional:   ?   General: He is not in acute distress. ?   Appearance: He is well-developed. He is not diaphoretic.  ?HENT:  ?   Head: Normocephalic and atraumatic.  ?Cardiovascular:  ?   Rate and Rhythm: Normal rate and regular rhythm.  ?   Heart sounds: No murmur heard. ?  No friction rub.  ?Pulmonary:  ?   Effort: Pulmonary effort is normal. No tachypnea, accessory muscle usage or respiratory distress.  ?   Breath sounds: Examination of the right-middle field reveals rhonchi. Examination of the left-middle field reveals rhonchi. Rhonchi present. No wheezing or rales.  ?Abdominal:  ?   General: Bowel sounds are normal. There is no distension.  ?   Palpations: Abdomen is  soft.  ?   Tenderness: There is no abdominal tenderness.  ?Musculoskeletal:     ?   General: Normal range of motion.  ?   Cervical back: Normal range of motion and neck supple.  ?Skin: ?   General: Skin is warm and dry.  ?Neurological:  ?   Mental Status: He is alert and oriented to person, place, and time.  ?   Coordination: Coordination normal.  ? ? ?ED Results / Procedures / Treatments   ?Labs ?(all labs ordered are listed, but only abnormal results are displayed) ?Labs Reviewed - No data to display ? ?EKG ?None ? ?Radiology ?No results found. ? ?Procedures ?Procedures  ? ? ?Medications Ordered in ED ?Medications  ?dexamethasone (DECADRON) injection 10 mg (has no administration in time range)  ?albuterol (PROVENTIL,VENTOLIN) solution continuous neb (has no administration in time range)  ? ? ?ED Course/  Medical Decision Making/ A&P ? ?Patient presenting here with wheezing and persistent cough.  He was seen with similar symptoms last week and prescribed Zithromax and prednisone.  Patient with some wheezing on exam that improved with an hour-long albuterol treatment.  Patient now states he feels much better.  Patient may benefit from home nebulizer.  This and albuterol will be prescribed. ? ?Final Clinical Impression(s) / ED Diagnoses ?Final diagnoses:  ?None  ? ? ?Rx / DC Orders ?ED Discharge Orders   ? ? None  ? ?  ? ? ?  ?Geoffery Lyons, MD ?08/27/21 [phone] ? ?

## 2021-09-01 ENCOUNTER — Ambulatory Visit (INDEPENDENT_AMBULATORY_CARE_PROVIDER_SITE_OTHER): Payer: Medicaid Other | Admitting: Family Medicine

## 2021-09-01 ENCOUNTER — Encounter: Payer: Self-pay | Admitting: Family Medicine

## 2021-09-01 ENCOUNTER — Encounter: Payer: Self-pay | Admitting: Internal Medicine

## 2021-09-01 ENCOUNTER — Other Ambulatory Visit: Payer: Self-pay

## 2021-09-01 VITALS — BP 152/79 | HR 101 | Temp 97.9°F | Wt 119.2 lb

## 2021-09-01 DIAGNOSIS — Z125 Encounter for screening for malignant neoplasm of prostate: Secondary | ICD-10-CM

## 2021-09-01 DIAGNOSIS — G4700 Insomnia, unspecified: Secondary | ICD-10-CM | POA: Diagnosis not present

## 2021-09-01 DIAGNOSIS — J189 Pneumonia, unspecified organism: Secondary | ICD-10-CM | POA: Insufficient documentation

## 2021-09-01 DIAGNOSIS — D649 Anemia, unspecified: Secondary | ICD-10-CM

## 2021-09-01 DIAGNOSIS — J449 Chronic obstructive pulmonary disease, unspecified: Secondary | ICD-10-CM | POA: Diagnosis not present

## 2021-09-01 DIAGNOSIS — R7309 Other abnormal glucose: Secondary | ICD-10-CM | POA: Diagnosis not present

## 2021-09-01 DIAGNOSIS — Z13220 Encounter for screening for lipoid disorders: Secondary | ICD-10-CM

## 2021-09-01 DIAGNOSIS — Z1159 Encounter for screening for other viral diseases: Secondary | ICD-10-CM

## 2021-09-01 MED ORDER — TRAZODONE HCL 50 MG PO TABS: 50.0000 mg | ORAL_TABLET | Freq: Every day | ORAL | 1 refills | Status: DC

## 2021-09-01 MED ORDER — ALBUTEROL SULFATE (2.5 MG/3ML) 0.083% IN NEBU: 2.5000 mg | INHALATION_SOLUTION | Freq: Four times a day (QID) | RESPIRATORY_TRACT | 1 refills | Status: DC | PRN

## 2021-09-01 MED ORDER — PREDNISONE 10 MG PO TABS: ORAL_TABLET | ORAL | 0 refills | Status: DC

## 2021-09-01 NOTE — Assessment & Plan Note (Signed)
Patient has underlying COPD.  Recent pulmonary infiltrates.  Followed by pulmonology.  Further work-up underway.  I spoke with pulmonology Dr. Sherene Sires today. He recommend additional course of prednisone. Rx sent. ?

## 2021-09-01 NOTE — Patient Instructions (Signed)
I have refilled the albuterol. ? ?I have sent in a medication to help with sleep. ? ?Labs today. ? ?I will talk with the pulmonologist. ? ?Follow up in 1 month.  ?

## 2021-09-01 NOTE — Assessment & Plan Note (Signed)
Trial of Trazodone. °

## 2021-09-01 NOTE — Progress Notes (Signed)
? ?Subjective:  ?Patient ID: Gregory Camacho, male    DOB: 09-16-1960  Age: 61 y.o. MRN: [id number] ? ?CC: ?Chief Complaint  ?Patient presents with  ? Establish Care  ?  Pt very winded today. Pt states he had labs done recently. Coughing up phelgm at times.   ? ? ?HPI: ? ?61 year old male with history of tobacco abuse and polysubstance abuse, recent pneumonia, COPD, chronic cough presents to establish care. ? ?Patient's most recent x-ray revealed diffuse pulmonary infiltrate.  He has previously had upper lobe infiltrates on xray and evidence of COPD as well.  Has recently completed course of azithromycin.  Patient has a little bit of prednisone left.  He has recently seen pulmonology.  CT scan for further evaluation is scheduled for the end of the month.  Patient reports continued shortness of breath.  He is using albuterol nebulizer with some improvement.  He had beneficial response to prednisone and is requesting more.  He states that he has smoked cigarettes for at least 40 years.  1 pack/day.  He states that he has recently quit.  Continues to have cough. ? ?Patient reports that he has difficulty sleep.  Has trouble falling asleep and staying asleep.  He states that he was previously given Ambien. ? ?Patient reports difficulty gaining weight.  Review of the EMR, he has lost a significant amount of weight since last year.  Last year he weighed 140 pounds.  Current weight is 119 pounds. ? ?Most recent labs from February of this year revealed leukocytosis, anemia and thrombocytosis.  No recent metabolic panel.  His prior metabolic panel from 1638 revealed elevated glucose, elevated LFTs. ? ?Patient Active Problem List  ? Diagnosis Date Noted  ? COPD (chronic obstructive pulmonary disease) ([place]) 09/01/2021  ? Insomnia 09/01/2021  ? Anemia 09/01/2021  ? Pneumonia 09/01/2021  ? Chronic cough 08/11/2021  ? Drug abuse, marijuana 02/26/2021  ? Drug abuse, cocaine type ([place]) 02/26/2021  ? ? ?Social Hx   ?Social History   ? ?Socioeconomic History  ? Marital status: Single  ?  Spouse name: Not on file  ? Number of children: Not on file  ? Years of education: Not on file  ? Highest education level: Not on file  ?Occupational History  ? Not on file  ?Tobacco Use  ? Smoking status: Former  ?  Packs/day: 1.00  ?  Years: 30.00  ?  Pack years: 30.00  ?  Types: Cigarettes  ?  Quit date: 05/2021  ?  Years since quitting: 0.2  ? Smokeless tobacco: Never  ?Vaping Use  ? Vaping Use: Never used  ?Substance and Sexual Activity  ? Alcohol use: Yes  ?  Alcohol/week: 2.0 standard drinks  ?  Types: 1 Cans of beer, 1 Shots of liquor per week  ?  Comment: occa  ? Drug use: Yes  ?  Types: Marijuana  ?  Comment: occassionally (last 05/23/18)  ? Sexual activity: Not on file  ?Other Topics Concern  ? Not on file  ?Social History Narrative  ? Not on file  ? ?Social Determinants of Health  ? ?Financial Resource Strain: Not on file  ?Food Insecurity: Not on file  ?Transportation Needs: Not on file  ?Physical Activity: Not on file  ?Stress: Not on file  ?Social Connections: Not on file  ? ? ?Review of Systems ?Per HPI ? ?Objective:  ?BP (!) 152/79   Pulse (!) 101   Temp 97.9 ?F (36.6 ?C)  Wt 119 lb 3.2 oz (54.1 kg)   SpO2 95%   BMI 18.12 kg/m?  ? ?BP/Weight 09/01/2021 08/27/2021 08/23/2021  ?Systolic BP 287 681 157  ?Diastolic BP 79 64 69  ?Wt. (Lbs) 119.2 121.25 121.03  ?BMI 18.12 18.44 18.4  ? ? ?Physical Exam ?Constitutional:   ?   Comments: Thin male.  ?HENT:  ?   Head: Normocephalic and atraumatic.  ?   Nose: Nose normal.  ?   Mouth/Throat:  ?   Pharynx: Oropharynx is clear.  ?   Comments: Poor dentition. ?Eyes:  ?   General:     ?   Right eye: No discharge.     ?   Left eye: No discharge.  ?   Conjunctiva/sclera: Conjunctivae normal.  ?Cardiovascular:  ?   Rate and Rhythm: Normal rate and regular rhythm.  ?Pulmonary:  ?   Effort: Pulmonary effort is normal.  ?   Comments: Faint wheezing. ?Crackles noted. ?Neurological:  ?   General: No focal deficit  present.  ?   Mental Status: He is alert.  ?Psychiatric:     ?   Mood and Affect: Mood normal.     ?   Behavior: Behavior normal.  ? ? ?Lab Results  ?Component Value Date  ? WBC 17.8 (H) 08/15/2021  ? HGB 11.7 (L) 08/15/2021  ? HCT 36.3 (L) 08/15/2021  ? PLT 714 (H) 08/15/2021  ? GLUCOSE 168 (H) 02/28/2021  ? ALT 76 (H) 02/28/2021  ? AST 59 (H) 02/28/2021  ? NA 139 02/28/2021  ? K 3.9 02/28/2021  ? CL 107 02/28/2021  ? CREATININE 0.71 02/28/2021  ? BUN 16 02/28/2021  ? CO2 26 02/28/2021  ? ? ? ?Assessment & Plan:  ? ?Problem List Items Addressed This Visit   ? ?  ? Respiratory  ? COPD (chronic obstructive pulmonary disease) ([facility]) - Primary (Chronic)  ?  Patient has underlying COPD.  Recent pulmonary infiltrates.  Followed by pulmonology.  Further work-up underway.  I spoke with pulmonology Dr. Melvyn Novas today. He recommend additional course of prednisone. Rx sent. ?  ?  ? Relevant Medications  ? albuterol (PROVENTIL) (2.5 MG/3ML) 0.083% nebulizer solution  ? predniSONE (DELTASONE) 10 MG tablet  ? Pneumonia  ?  Recent infiltrates of both the upper and lower lobes.  Follows with pulmonology.  We will try and expedite CT imaging.  Will likely need bronchoscopy. ?  ?  ? Relevant Medications  ? albuterol (PROVENTIL) (2.5 MG/3ML) 0.083% nebulizer solution  ?  ? Other  ? Insomnia  ?  Trial of Trazodone. ?  ?  ? Anemia  ?  CBC, iron studies, B12, and folate for further evaluation. ?  ?  ? Relevant Orders  ? CBC with Differential  ? Folate  ? Iron, TIBC and Ferritin Panel  ? Vitamin B12  ? ?Other Visit Diagnoses   ? ? Elevated glucose      ? Relevant Orders  ? CMP14+EGFR  ? Hemoglobin A1c  ? Screening, lipid      ? Relevant Orders  ? Lipid panel  ? Prostate cancer screening      ? Relevant Orders  ? PSA  ? Need for hepatitis C screening test      ? Relevant Orders  ? HCV Ab w Reflex to Quant PCR  ? ?  ? ? ?Meds ordered this encounter  ?Medications  ? albuterol (PROVENTIL) (2.5 MG/3ML) 0.083% nebulizer solution  ?  Sig: Take 3 mLs  (2.5  mg total) by nebulization every 6 (six) hours as needed for wheezing or shortness of breath.  ?  Dispense:  75 mL  ?  Refill:  1  ? traZODone (DESYREL) 50 MG tablet  ?  Sig: Take 1 tablet (50 mg total) by mouth at bedtime.  ?  Dispense:  90 tablet  ?  Refill:  1  ? predniSONE (DELTASONE) 10 MG tablet  ?  Sig: 40 mg daily x 3 days, then 30 mg daily x 3 days, then 20 mg daily x 3 days, then 10 mg daily x 3 days.  ?  Dispense:  30 tablet  ?  Refill:  0  ? ? ?Follow-up:  Return in about 1 month (around 10/02/2021). ? ?Thersa Salt DO ?[place] ? ?

## 2021-09-01 NOTE — Assessment & Plan Note (Signed)
Recent infiltrates of both the upper and lower lobes.  Follows with pulmonology.  We will try and expedite CT imaging.  Will likely need bronchoscopy. ?

## 2021-09-01 NOTE — Assessment & Plan Note (Signed)
CBC, iron studies, B12, and folate for further evaluation. ?

## 2021-09-02 ENCOUNTER — Telehealth: Payer: Self-pay | Admitting: Family Medicine

## 2021-09-02 ENCOUNTER — Encounter: Payer: Self-pay | Admitting: Family Medicine

## 2021-09-02 ENCOUNTER — Other Ambulatory Visit: Payer: Self-pay | Admitting: Family Medicine

## 2021-09-02 ENCOUNTER — Other Ambulatory Visit (HOSPITAL_COMMUNITY)
Admission: RE | Admit: 2021-09-02 | Discharge: 2021-09-02 | Disposition: A | Discharge status: Home / Self Care | Payer: Medicaid Other | Source: Ambulatory Visit | Attending: Family Medicine | Admitting: Family Medicine

## 2021-09-02 ENCOUNTER — Other Ambulatory Visit: Payer: Self-pay

## 2021-09-02 DIAGNOSIS — E875 Hyperkalemia: Secondary | ICD-10-CM

## 2021-09-02 DIAGNOSIS — D72829 Elevated white blood cell count, unspecified: Secondary | ICD-10-CM | POA: Insufficient documentation

## 2021-09-02 DIAGNOSIS — E119 Type 2 diabetes mellitus without complications: Secondary | ICD-10-CM | POA: Insufficient documentation

## 2021-09-02 DIAGNOSIS — D509 Iron deficiency anemia, unspecified: Secondary | ICD-10-CM | POA: Insufficient documentation

## 2021-09-02 DIAGNOSIS — D75839 Thrombocytosis, unspecified: Secondary | ICD-10-CM | POA: Insufficient documentation

## 2021-09-02 DIAGNOSIS — Z1211 Encounter for screening for malignant neoplasm of colon: Secondary | ICD-10-CM

## 2021-09-02 LAB — CBC WITH DIFFERENTIAL/PLATELET
Basophils Absolute: 0.1 10*3/uL (ref 0.0–0.2)
Basos: 1 %
EOS (ABSOLUTE): 0.8 10*3/uL — ABNORMAL HIGH (ref 0.0–0.4)
Eos: 4 %
Hematocrit: 36.1 % — ABNORMAL LOW (ref 37.5–51.0)
Hemoglobin: 11.6 g/dL — ABNORMAL LOW (ref 13.0–17.7)
Immature Grans (Abs): 0 10*3/uL (ref 0.0–0.1)
Immature Granulocytes: 0 %
Lymphocytes Absolute: 1.7 10*3/uL (ref 0.7–3.1)
Lymphs: 9 %
MCH: 25.4 pg — ABNORMAL LOW (ref 26.6–33.0)
MCHC: 32.1 g/dL (ref 31.5–35.7)
MCV: 79 fL (ref 79–97)
Monocytes Absolute: 1.1 10*3/uL — ABNORMAL HIGH (ref 0.1–0.9)
Monocytes: 6 %
Neutrophils Absolute: 14.9 10*3/uL — ABNORMAL HIGH (ref 1.4–7.0)
Neutrophils: 80 %
Platelets: 691 10*3/uL — ABNORMAL HIGH (ref 150–450)
RBC: 4.57 x10E6/uL (ref 4.14–5.80)
RDW: 13.4 % (ref 11.6–15.4)
WBC: 18.6 10*3/uL — ABNORMAL HIGH (ref 3.4–10.8)

## 2021-09-02 LAB — LIPID PANEL
Chol/HDL Ratio: 2 ratio (ref 0.0–5.0)
Cholesterol, Total: 145 mg/dL (ref 100–199)
HDL: 72 mg/dL (ref >39)
LDL Chol Calc (NIH): 63 mg/dL (ref 0–99)
Triglycerides: 43 mg/dL (ref 0–149)
VLDL Cholesterol Cal: 10 mg/dL (ref 5–40)

## 2021-09-02 LAB — CMP14+EGFR
ALT: 19 IU/L (ref 0–44)
AST: 21 IU/L (ref 0–40)
Albumin/Globulin Ratio: 0.9 — ABNORMAL LOW (ref 1.2–2.2)
Albumin: 3.7 g/dL — ABNORMAL LOW (ref 3.8–4.8)
Alkaline Phosphatase: 96 IU/L (ref 44–121)
BUN/Creatinine Ratio: 16 (ref 10–24)
BUN: 13 mg/dL (ref 8–27)
Bilirubin Total: <0.2 mg/dL (ref 0.0–1.2)
CO2: 30 mmol/L — ABNORMAL HIGH (ref 20–29)
Calcium: 9.7 mg/dL (ref 8.6–10.2)
Chloride: 100 mmol/L (ref 96–106)
Creatinine, Ser: 0.8 mg/dL (ref 0.76–1.27)
Globulin, Total: 4.3 g/dL (ref 1.5–4.5)
Glucose: 102 mg/dL — ABNORMAL HIGH (ref 70–99)
Potassium: 6 mmol/L — ABNORMAL HIGH (ref 3.5–5.2)
Sodium: 142 mmol/L (ref 134–144)
Total Protein: 8 g/dL (ref 6.0–8.5)
eGFR: 99 mL/min/{1.73_m2} (ref >59)

## 2021-09-02 LAB — HEMOGLOBIN A1C
Est. average glucose Bld gHb Est-mCnc: 151 mg/dL
Hgb A1c MFr Bld: 6.9 % — ABNORMAL HIGH (ref 4.8–5.6)

## 2021-09-02 LAB — BASIC METABOLIC PANEL
Anion gap: 6 (ref 5–15)
BUN: 16 mg/dL (ref 8–23)
CO2: 33 mmol/L — ABNORMAL HIGH (ref 22–32)
Calcium: 8.7 mg/dL — ABNORMAL LOW (ref 8.9–10.3)
Chloride: 99 mmol/L (ref 98–111)
Creatinine, Ser: 0.79 mg/dL (ref 0.61–1.24)
GFR, Estimated: >60 mL/min (ref >60)
Glucose, Bld: 134 mg/dL — ABNORMAL HIGH (ref 70–99)
Potassium: 5.3 mmol/L — ABNORMAL HIGH (ref 3.5–5.1)
Sodium: 138 mmol/L (ref 135–145)

## 2021-09-02 LAB — IRON,TIBC AND FERRITIN PANEL
Ferritin: 363 ng/mL (ref 30–400)
Iron Saturation: 8 % — CL (ref 15–55)
Iron: 22 ug/dL — ABNORMAL LOW (ref 38–169)
Total Iron Binding Capacity: 261 ug/dL (ref 250–450)
UIBC: 239 ug/dL (ref 111–343)

## 2021-09-02 LAB — HCV AB W REFLEX TO QUANT PCR: HCV Ab: NONREACTIVE

## 2021-09-02 LAB — FOLATE: Folate: >20 ng/mL (ref >3.0)

## 2021-09-02 LAB — PSA: Prostate Specific Ag, Serum: <0.1 ng/mL (ref 0.0–4.0)

## 2021-09-02 LAB — HCV INTERPRETATION

## 2021-09-02 LAB — VITAMIN B12: Vitamin B-12: >2000 pg/mL — ABNORMAL HIGH (ref 232–1245)

## 2021-09-02 MED ORDER — IRON (FERROUS SULFATE) 325 (65 FE) MG PO TABS: 325.0000 mg | ORAL_TABLET | ORAL | 3 refills | Status: DC

## 2021-09-02 MED ORDER — METFORMIN HCL 500 MG PO TABS: 500.0000 mg | ORAL_TABLET | Freq: Two times a day (BID) | ORAL | 3 refills | Status: DC

## 2021-09-02 NOTE — Telephone Encounter (Signed)
Pt contacted and verbalized understanding. Pt will head to [facility] now  ?

## 2021-09-02 NOTE — Progress Notes (Unsigned)
Orders placed per drs recommendations ?

## 2021-09-02 NOTE — Telephone Encounter (Signed)
Pt needing stat labs due to elevated potassium. Stat BMP ordered and faxed to [facility] lab.  Left detailed message on pt voicemail (per DPR)  ?

## 2021-09-06 ENCOUNTER — Other Ambulatory Visit: Payer: Self-pay

## 2021-09-06 ENCOUNTER — Telehealth: Payer: Self-pay

## 2021-09-07 NOTE — Telephone Encounter (Signed)
Spoke with patient and has been made aware per drs recommendations. Has an appt 09/13/21 with pulmonology ?

## 2021-09-08 ENCOUNTER — Encounter: Payer: Self-pay | Admitting: Internal Medicine

## 2021-09-08 ENCOUNTER — Emergency Department (HOSPITAL_COMMUNITY)
Admission: EM | Admit: 2021-09-08 | Discharge: 2021-09-08 | Disposition: A | Discharge status: Home / Self Care | Payer: Medicaid Other | Attending: Emergency Medicine | Admitting: Emergency Medicine

## 2021-09-08 ENCOUNTER — Encounter (HOSPITAL_COMMUNITY): Payer: Self-pay

## 2021-09-08 ENCOUNTER — Emergency Department (HOSPITAL_COMMUNITY): Payer: Medicaid Other

## 2021-09-08 ENCOUNTER — Other Ambulatory Visit: Payer: Self-pay

## 2021-09-08 DIAGNOSIS — J441 Chronic obstructive pulmonary disease with (acute) exacerbation: Secondary | ICD-10-CM | POA: Diagnosis not present

## 2021-09-08 DIAGNOSIS — D72829 Elevated white blood cell count, unspecified: Secondary | ICD-10-CM | POA: Diagnosis not present

## 2021-09-08 DIAGNOSIS — D649 Anemia, unspecified: Secondary | ICD-10-CM | POA: Insufficient documentation

## 2021-09-08 DIAGNOSIS — R0602 Shortness of breath: Secondary | ICD-10-CM | POA: Diagnosis present

## 2021-09-08 LAB — CBC WITH DIFFERENTIAL/PLATELET
Abs Immature Granulocytes: 0.09 10*3/uL — ABNORMAL HIGH (ref 0.00–0.07)
Basophils Absolute: 0.1 10*3/uL (ref 0.0–0.1)
Basophils Relative: 1 %
Eosinophils Absolute: 0.3 10*3/uL (ref 0.0–0.5)
Eosinophils Relative: 2 %
HCT: 37.3 % — ABNORMAL LOW (ref 39.0–52.0)
Hemoglobin: 11.1 g/dL — ABNORMAL LOW (ref 13.0–17.0)
Immature Granulocytes: 1 %
Lymphocytes Relative: 10 %
Lymphs Abs: 1.6 10*3/uL (ref 0.7–4.0)
MCH: 24.4 pg — ABNORMAL LOW (ref 26.0–34.0)
MCHC: 29.8 g/dL — ABNORMAL LOW (ref 30.0–36.0)
MCV: 82 fL (ref 80.0–100.0)
Monocytes Absolute: 0.5 10*3/uL (ref 0.1–1.0)
Monocytes Relative: 3 %
Neutro Abs: 13.2 10*3/uL — ABNORMAL HIGH (ref 1.7–7.7)
Neutrophils Relative %: 83 %
Platelets: 469 10*3/uL — ABNORMAL HIGH (ref 150–400)
RBC: 4.55 MIL/uL (ref 4.22–5.81)
RDW: 16 % — ABNORMAL HIGH (ref 11.5–15.5)
WBC: 15.8 10*3/uL — ABNORMAL HIGH (ref 4.0–10.5)
nRBC: 0 % (ref 0.0–0.2)

## 2021-09-08 LAB — COMPREHENSIVE METABOLIC PANEL
ALT: 20 U/L (ref 0–44)
AST: 21 U/L (ref 15–41)
Albumin: 3.2 g/dL — ABNORMAL LOW (ref 3.5–5.0)
Alkaline Phosphatase: 79 U/L (ref 38–126)
Anion gap: 9 (ref 5–15)
BUN: 22 mg/dL (ref 8–23)
CO2: 29 mmol/L (ref 22–32)
Calcium: 8.7 mg/dL — ABNORMAL LOW (ref 8.9–10.3)
Chloride: 99 mmol/L (ref 98–111)
Creatinine, Ser: 0.99 mg/dL (ref 0.61–1.24)
GFR, Estimated: >60 mL/min (ref >60)
Glucose, Bld: 107 mg/dL — ABNORMAL HIGH (ref 70–99)
Potassium: 4.4 mmol/L (ref 3.5–5.1)
Sodium: 137 mmol/L (ref 135–145)
Total Bilirubin: 0.4 mg/dL (ref 0.3–1.2)
Total Protein: 8 g/dL (ref 6.5–8.1)

## 2021-09-08 MED ORDER — ALBUTEROL SULFATE HFA 108 (90 BASE) MCG/ACT IN AERS
2.0000 | INHALATION_SPRAY | Freq: Once | RESPIRATORY_TRACT | Status: AC
  Administered 2021-09-08: 2 via RESPIRATORY_TRACT
  Filled 2021-09-08: qty 6.7

## 2021-09-08 MED ORDER — ALBUTEROL SULFATE (2.5 MG/3ML) 0.083% IN NEBU
2.5000 mg | INHALATION_SOLUTION | Freq: Once | RESPIRATORY_TRACT | Status: AC
  Administered 2021-09-08: 2.5 mg via RESPIRATORY_TRACT
  Filled 2021-09-08: qty 3

## 2021-09-08 MED ORDER — METHYLPREDNISOLONE SODIUM SUCC 125 MG IJ SOLR
125.0000 mg | Freq: Once | INTRAMUSCULAR | Status: AC
Start: 2021-09-08 — End: 2021-09-08
  Administered 2021-09-08: 125 mg via INTRAVENOUS
  Filled 2021-09-08: qty 2

## 2021-09-08 MED ORDER — PREDNISONE 10 MG PO TABS: 20.0000 mg | ORAL_TABLET | Freq: Every day | ORAL | 0 refills | Status: DC

## 2021-09-08 MED ORDER — IPRATROPIUM-ALBUTEROL 0.5-2.5 (3) MG/3ML IN SOLN
3.0000 mL | Freq: Once | RESPIRATORY_TRACT | Status: AC
  Administered 2021-09-08: 3 mL via RESPIRATORY_TRACT
  Filled 2021-09-08: qty 3

## 2021-09-08 MED ORDER — MAGNESIUM SULFATE 2 GM/50ML IV SOLN
2.0000 g | Freq: Once | INTRAVENOUS | Status: AC
  Administered 2021-09-08: 2 g via INTRAVENOUS
  Filled 2021-09-08: qty 50

## 2021-09-08 NOTE — ED Provider Notes (Addendum)
?Gregory Camacho ?Provider Note ? ? ?CSN: [id number] ?Arrival date & time: 09/08/21  1884 ? ?  ? ?History ? ?Chief Complaint  ?Patient presents with  ? Shortness of Breath  ? ? ?Gregory Camacho is a 61 y.o. male. ? ?Patient complains of shortness of breath.  Patient has a history of COPD ? ?The history is provided by the patient and medical records.  ?Shortness of Breath ?Severity:  Moderate ?Onset quality:  Sudden ?Timing:  Constant ?Progression:  Worsening ?Chronicity:  Recurrent ?Context: activity   ?Relieved by:  Nothing ?Worsened by:  Nothing ?Ineffective treatments:  None tried ?Associated symptoms: wheezing   ?Associated symptoms: no abdominal pain, no chest pain, no cough, no headaches and no rash   ? ?  ? ?Home Medications ?Prior to Admission medications   ?Medication Sig Start Date End Date Taking? Authorizing Provider  ?albuterol (PROVENTIL) (2.5 MG/3ML) 0.083% nebulizer solution Take 3 mLs (2.5 mg total) by nebulization every 6 (six) hours as needed for wheezing or shortness of breath. 09/01/21  Yes Gregory Sams, DO  ?Iron, Ferrous Sulfate, 325 (65 Fe) MG TABS Take 325 mg by mouth every other day. 09/02/21  Yes Gregory Other G, DO  ?metFORMIN (GLUCOPHAGE) 500 MG tablet Take 1 tablet (500 mg total) by mouth 2 (two) times daily with a meal. 09/02/21  Yes Camacho, Gregory G, DO  ?predniSONE (DELTASONE) 10 MG tablet Take 2 tablets (20 mg total) by mouth daily. 09/08/21  Yes Gregory Berkshire, MD  ?traZODone (DESYREL) 50 MG tablet Take 1 tablet (50 mg total) by mouth at bedtime. 09/01/21  Yes Gregory Sams, DO  ?   ? ?Allergies    ?Patient has no known allergies.   ? ?Review of Systems   ?Review of Systems  ?Constitutional:  Negative for appetite change and fatigue.  ?HENT:  Negative for congestion, ear discharge and sinus pressure.   ?Eyes:  Negative for discharge.  ?Respiratory:  Positive for shortness of breath and wheezing. Negative for cough.   ?Cardiovascular:  Negative for chest pain.   ?Gastrointestinal:  Negative for abdominal pain and diarrhea.  ?Genitourinary:  Negative for frequency and hematuria.  ?Musculoskeletal:  Negative for back pain.  ?Skin:  Negative for rash.  ?Neurological:  Negative for seizures and headaches.  ?Psychiatric/Behavioral:  Negative for hallucinations.   ? ?Physical Exam ?Updated Vital Signs ?BP 107/70   Pulse 88   Temp 98.3 ?F (36.8 ?C) (Oral)   Resp (!) 24   Ht 5\' 8"  (1.727 m)   Wt 55 kg   SpO2 95%   BMI 18.44 kg/m?  ?Physical Exam ?Vitals and nursing note reviewed.  ?Constitutional:   ?   Appearance: He is well-developed.  ?HENT:  ?   Head: Normocephalic.  ?   Nose: Nose normal.  ?Eyes:  ?   General: No scleral icterus. ?   Conjunctiva/sclera: Conjunctivae normal.  ?Neck:  ?   Thyroid: No thyromegaly.  ?Cardiovascular:  ?   Rate and Rhythm: Normal rate and regular rhythm.  ?   Heart sounds: No murmur heard. ?  No friction rub. No gallop.  ?Pulmonary:  ?   Breath sounds: No stridor. Wheezing present. No rales.  ?Chest:  ?   Chest wall: No tenderness.  ?Abdominal:  ?   General: There is no distension.  ?   Tenderness: There is no abdominal tenderness. There is no rebound.  ?Musculoskeletal:     ?   General: Normal range of motion.  ?  Cervical back: Neck supple.  ?Lymphadenopathy:  ?   Cervical: No cervical adenopathy.  ?Skin: ?   Findings: No erythema or rash.  ?Neurological:  ?   Mental Status: He is alert and oriented to person, place, and time.  ?   Motor: No abnormal muscle tone.  ?   Coordination: Coordination normal.  ?Psychiatric:     ?   Behavior: Behavior normal.  ? ? ?ED Results / Procedures / Treatments   ?Labs ?(all labs ordered are listed, but only abnormal results are displayed) ?Labs Reviewed  ?CBC WITH DIFFERENTIAL/PLATELET - Abnormal; Notable for the following components:  ?    Result Value  ? WBC 15.8 (*)   ? Hemoglobin 11.1 (*)   ? HCT 37.3 (*)   ? MCH 24.4 (*)   ? MCHC 29.8 (*)   ? RDW 16.0 (*)   ? Platelets 469 (*)   ? Neutro Abs 13.2 (*)    ? Abs Immature Granulocytes 0.09 (*)   ? All other components within normal limits  ?COMPREHENSIVE METABOLIC PANEL - Abnormal; Notable for the following components:  ? Glucose, Bld 107 (*)   ? Calcium 8.7 (*)   ? Albumin 3.2 (*)   ? All other components within normal limits  ? ? ?EKG ?None ? ?Radiology ?DG Chest Port 1 View ? ?Result Date: 09/08/2021 ?CLINICAL DATA:  61 year old male with shortness of breath, productive cough. EXAM: PORTABLE CHEST 1 VIEW COMPARISON:  Chest radiographs 08/23/2021 and earlier. FINDINGS: Portable AP upright view at 0814 hours. Chronic hyperinflation. Normal cardiac size and mediastinal contours. Visualized tracheal air column is within normal limits. Coarse bilateral pulmonary interstitial opacity appears to be at baseline when compared to last year. No pneumothorax, pleural effusion or acute pulmonary opacity identified. No acute osseous abnormality identified. IMPRESSION: Chronic lung disease with hyperinflation appears to be at baseline. No acute cardiopulmonary abnormality. Electronically Signed   By: Odessa FlemingH  Hall M.D.   On: 09/08/2021 08:23   ? ?Procedures ?Procedures  ? ? ?Medications Ordered in ED ?Medications  ?albuterol (VENTOLIN HFA) 108 (90 Base) MCG/ACT inhaler 2 puff (has no administration in time range)  ?magnesium sulfate IVPB 2 Camacho 50 mL (0 Camacho Intravenous Stopped 09/08/21 1007)  ?methylPREDNISolone sodium succinate (SOLU-MEDROL) 125 mg/2 mL injection 125 mg (125 mg Intravenous Given 09/08/21 0828)  ?ipratropium-albuterol (DUONEB) 0.5-2.5 (3) MG/3ML nebulizer solution 3 mL (3 mLs Nebulization Given 09/08/21 0913)  ?albuterol (PROVENTIL) (2.5 MG/3ML) 0.083% nebulizer solution 2.5 mg (2.5 mg Nebulization Given 09/08/21 0912)  ? ? ?ED Course/ Medical Decision Making/ A&P ?  ?                        ?This patient presents to the ED for concern of shortness of breath, this involves an extensive number of treatment options, and is a complaint that carries with it a high risk of  complications and morbidity.  The differential diagnosis includes pneumonia COPD ? ? ?Co morbidities that complicate the patient evaluation ? ?COPD ? ? ?Additional history obtained: ? ?Additional history obtained from patient ?External records from outside source obtained and reviewed including hospital rec ? ? ?Lab Tests: ? ?I Ordered, and personally interpreted labs.  The pertinent results include: CBC chemistries which shows elevated white count of 15,000 and mild anemia ? ? ?Imaging Studies ordered: ? ?I ordered imaging studies including chest x-ray ?I independently visualized and interpreted imaging which showed chronic lung disease ?I agree with the radiologist interpretation ? ? ?  Cardiac Monitoring: ? ?The patient was maintained on a cardiac monitor.  I personally viewed and interpreted the cardiac monitored which showed an underlying rhythm of: Normal sinus rhythm ? ? ?Medicines ordered and prescription drug management: ? ?I ordered medication including albuterol and Atrovent for shortness of breath along with steroids ?Reevaluation of the patient after these medicines showed that the patient improved ?I have reviewed the patients home medicines and have made adjustments as needed ? ? ?Test Considered: ? ?None ? ? ?Critical Interventions: ? ?Neb treatments of albuterol and Atrovent ? ? ?Consultations Obtained: ?No consult ? ?Problem List / ED Course: ? ?COPD exacerbation ? ? ? ?Social Determinants of Health: ? ?None ? ? ? ? ? ? ?Patient with COPD exacerbation.  He ambulated without desatting below 90.  He will be discharged home with albuterol inhaler and prednisone ? ? ? ? ? ? ? ?Final Clinical Impression(s) / ED Diagnoses ?Final diagnoses:  ?COPD exacerbation (HCC)  ? ? ?Rx / DC Orders ?ED Discharge Orders   ? ?      Ordered  ?  predniSONE (DELTASONE) 10 MG tablet  Daily       ? 09/08/21 1125  ? ?  ?  ? ?  ? ? ?  ?Gregory Berkshire, MD ?09/09/21 1023 ? ?  ?Gregory Berkshire, MD ?09/09/21 1030 ? ?

## 2021-09-08 NOTE — ED Triage Notes (Signed)
Patient complains of chronic SHOB and states he is coughing up yellow tinged sputum. Patient talking in complete sentences ?

## 2021-09-08 NOTE — Discharge Instructions (Addendum)
Use your inhaler every 4-6 hours as needed and follow-up with your doctor this week ?

## 2021-09-08 NOTE — ED Notes (Signed)
Patient 86% on RA. Patient placed on 2L/[place] at this time. Respiratory at bedside. ?

## 2021-09-08 NOTE — ED Notes (Signed)
Ambulated patient - while walking the patient's maintained a o2 sat of 90% ?When patient returned to room and started coughing - o2 sat dropped to 89% ?

## 2021-09-13 ENCOUNTER — Encounter: Payer: Self-pay | Admitting: Internal Medicine

## 2021-09-13 ENCOUNTER — Other Ambulatory Visit (HOSPITAL_COMMUNITY): Payer: Self-pay

## 2021-09-13 ENCOUNTER — Other Ambulatory Visit: Payer: Self-pay

## 2021-09-13 ENCOUNTER — Ambulatory Visit (INDEPENDENT_AMBULATORY_CARE_PROVIDER_SITE_OTHER): Payer: Medicaid Other | Admitting: Internal Medicine

## 2021-09-13 ENCOUNTER — Telehealth: Payer: Self-pay | Admitting: Internal Medicine

## 2021-09-13 DIAGNOSIS — R053 Chronic cough: Secondary | ICD-10-CM | POA: Diagnosis not present

## 2021-09-13 MED ORDER — ALBUTEROL SULFATE HFA 108 (90 BASE) MCG/ACT IN AERS: INHALATION_SPRAY | RESPIRATORY_TRACT | 11 refills | Status: DC

## 2021-09-13 MED ORDER — BUDESONIDE-FORMOTEROL FUMARATE 80-4.5 MCG/ACT IN AERO: INHALATION_SPRAY | RESPIRATORY_TRACT | 11 refills | Status: DC

## 2021-09-13 MED ORDER — ALBUTEROL SULFATE HFA 108 (90 BASE) MCG/ACT IN AERS: 2.0000 | INHALATION_SPRAY | Freq: Four times a day (QID) | RESPIRATORY_TRACT | 6 refills | Status: DC | PRN

## 2021-09-13 NOTE — Telephone Encounter (Signed)
I ran a test claim for Ventolin instead of the ProAir and got a paid claim.  $4 copay, no PA needed.  Just need a new script sent to the pharmacy to fill. ? ?Dr. Sherene Sires please advise if this is okay. Thanks!  ?

## 2021-09-13 NOTE — Progress Notes (Signed)
? ?Gregory Camacho, male    DOB: Oct 07, 1960   MRN: [id number] ? ? 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 ?Says never had resp problems prior to Dec 2022 ? ? ?Admit date: 02/25/2021 ?Discharge date: 02/28/2021 ?  ? Pneumonia with elevated PCT/ neg cultures ?- Continue current antibiotics course 5 to 7 days.  Will adjust antibiotics if we obtain positive sputum culture. ?- DuoNeb QID ?-Solu-Medrol 60 mg BID ?- Expiratory spirometry ?- Flutter valve ?  ?Depression ?-Not on home meds ?  ?Nicotine abuse ?- Nicotine patch ?  ?Drug abuse ?- 9/2 urine rapid drug screen positive cocaine and marijuana ?-9/3 Counseling of resources available to stop use of marijuana and cocaine ?  ?Goals of care ?- 9/4 consult to LCSW: Patient requests to obtain a list of PCPs accepting new patients in town prior to [place] most likely in the [place] ?NOTE: Also would be of benefit to have list of resources to help in discontinuing use of illegal substances ?  ? ? ? ? ?History of Present Illness  ?08/11/2021  Pulmonary/ 1st Camacho eval/ Gregory Camacho / Gregory Camacho Camacho  ?Chief Complaint  ?Patient presents with  ? Consult  ?  Chronic productive cough X approx.  1 month   ?Dyspnea:   limited by back not breathing  ?Cough: more when lie down assoc with watery pnds x 20 years > mucoid more than purulent  ?Sleep: on side / sleeps on couch  ?SABA use:  not helping  ?Rec ?Pantoprazole (protonix) 40 mg   Take  30-60 min before first meal of the day and Pepcid (famotidine)  20 mg after supper until return to Camacho  ?For cough > mucinex dm 1200 mg every 12 hours (over the counter)  ?GERD diet reviewed, bed blocks rec  ?Depomedrol 80mg  IM  ?We will schedule a CT of your chest and call the results   ?Please schedule a follow up Camacho visit in 4 weeks, sooner if needed with all medications in hand   ? ? ? ? ?09/13/2021  f/u ov/Gregory Camacho/Gregory Camacho re: cough x 20 y esp hs  maint on ?  (did not bring meds as req but reports "always better on prednisone)  ?Chief Complaint  ?Patient presents with  ? Follow-up  ?  Patient feels better overall but is still coughing up mucus.   ?Dyspnea:  back is better and says not limited by breathing at this point  ?Cough: p supper and hs / mucoid but better on pred ?Sleeping: 3 pillows /no bed blocks / says wakes up around 3am coughing each noct ?SABA use: avg every 4 -5 h ?02: none  ?Covid status: vax x 2  ?  ? ? ?No obvious day to day or daytime variability or assoc excess/ purulent sputum or mucus plugs or hemoptysis or cp or chest tightness, subjective wheeze or overt sinus or hb symptoms.  ? ?  Also denies any obvious fluctuation of symptoms with weather or environmental changes or other aggravating or alleviating factors except as outlined above  ? ?No unusual exposure hx or h/o childhood pna/ asthma or knowledge of premature birth. ? ?Current Allergies, Complete Past Medical History, Past Surgical History, Family History, and Social History were reviewed in [employer] record. ? ?ROS  The following are not  active complaints unless bolded ?Hoarseness, sore throat, dysphagia, dental problems, itching, sneezing,  nasal congestion or discharge of excess mucus or purulent secretions, ear ache,   fever, chills, sweats, unintended wt loss or wt gain, classically pleuritic or exertional cp,  orthopnea pnd or arm/hand swelling  or leg swelling, presyncope, palpitations, abdominal pain, anorexia, nausea, vomiting, diarrhea  or change in bowel habits or change in bladder habits, change in stools or change in urine, dysuria, hematuria,  rash, arthralgias, visual complaints, headache, numbness, weakness or ataxia or problems with walking or coordination,  change in mood or  memory. ?      ? ?Current Meds  ?Medication Sig  ? albuterol (PROVENTIL) (2.5 MG/3ML)  0.083% nebulizer solution Take 3 mLs (2.5 mg total) by nebulization every 6 (six) hours as needed for wheezing or shortness of breath.  ? Iron, Ferrous Sulfate, 325 (65 Fe) MG TABS Take 325 mg by mouth every other day.  ? metFORMIN (GLUCOPHAGE) 500 MG tablet Take 1 tablet (500 mg total) by mouth 2 (two) times daily with a meal.  ? predniSONE (DELTASONE) 10 MG tablet Take 2 tablets (20 mg total) by mouth daily.  ? traZODone (DESYREL) 50 MG tablet Take 1 tablet (50 mg total) by mouth at bedtime.  ?     ? ? ?Objective:  ?  ? ? wts ? ?09/13/2021       118   ?08/11/21 121 lb (54.9 kg)  ?08/08/21 141 lb 1.5 oz (64 kg)  ?03/03/21 140 lb (63.5 kg)  ?  ?Vital signs reviewed  09/13/2021  - Note at rest 02 sats  98% on RA  ? ?General appearance:    hoarse thin amb bm nad   ?HEENT : pt wearing mask not removed for exam due to covid - 19 concerns.  ? ?NECK :  without JVD/Nodes/TM/ nl carotid upstrokes bilaterally ? ? ?LUNGS: no acc muscle use,  Min barrel  contour chest wall with bilateral  slightly decreased bs s audible wheeze and  without cough on insp or exp maneuvers and min  Hyperresonant  to  percussion bilaterally   ? ? ?CV:  RRR  no s3 or murmur or increase in P2, and no edema  ? ?ABD:  soft and nontender with pos end  insp Hoover's  in the supine position. No bruits or organomegaly appreciated, bowel sounds nl ? ?MS:   Nl gait/  ext warm without deformities, calf tenderness, cyanosis or clubbing ?No obvious joint restrictions  ? ?SKIN: warm and dry without lesions   ? ?NEURO:  alert, approp, nl sensorium with  no motor or cerebellar deficits apparent.  ?    ? ?  ? ?  ? ?I personally reviewed images and agree with radiology impression as follows:  ?CXR:   portable  09/08/21  ?Chronic lung disease with hyperinflation appears to be at baseline. ?No acute cardiopulmonary abnormality. ?  ? ?  ? ?Lab Results  ?Component Value Date  ? WBC 17.8 (H) 08/15/2021  ? HGB 11.7 (L) 08/15/2021  ? HCT 36.3 (L) 08/15/2021  ? MCV 79  08/15/2021  ? PLT 714 (H) 08/15/2021  ?      EOS  0.4                                     08/15/21  ?  ? ?  ?Lab Results  ?Component Value Date  ? ESRSEDRATE 86 (H) 08/15/2021  ? ESRSEDRATE 82 (H) 08/11/2021  ?  ?   ?   ? ? ?   ?Assessment  ? ?  ?   ?

## 2021-09-13 NOTE — Patient Instructions (Addendum)
Pantoprazole (protonix) 40 mg   Take  30-60 min before first meal of the day and Pepcid (famotidine)  20 mg after supper until return to office - this is the best way to tell whether stomach acid is contributing to your problem. ? ?For cough > mucinex dm 1200 mg every 12 hours (over the counter)  ? ?GERD (REFLUX)  is an extremely common cause of respiratory symptoms just like yours , many times with no obvious heartburn at all.  ? ? It can be treated with medication, but also with lifestyle changes including elevation of the head of your bed (ideally with 6 -8inch blocks under the headboard of your bed),  Smoking cessation, avoidance of late meals, excessive alcohol, and avoid fatty foods, chocolate, peppermint, colas, red wine, and acidic juices such as orange juice.  ?NO MINT OR MENTHOL PRODUCTS SO NO COUGH DROPS  ?USE SUGARLESS CANDY INSTEAD (Jolley ranchers or Stover's or Life Savers) or even ice chips will also do - the key is to swallow to prevent all throat clearing. ?NO OIL BASED VITAMINS - use powdered substitutes.  Avoid fish oil when coughing.    ? ? ?Prednisone 10 mg take  4 each am x 2 days,   2 each am x 2 days,  1 each am x 2 days and stop  ? ?Start symbicort 80 Take 2 puffs first thing in am and then another 2 puffs about 12 hours later.  ?  ? ?Only use your albuterol as a rescue medication to be used if you can't catch your breath by resting or doing a relaxed purse lip breathing pattern.  ?- The less you use it, the better it will work when you need it. ?- Ok to use up to 2 puffs  every 4 hours if you must but call for immediate appointment if use goes up over your usual need ?- Don't leave home without it !!  (think of it like the starter fluid for your car)  ? ?Pfts next available ? ?Please schedule a follow up office visit in 6 weeks, call sooner if needed with all medications /inhalers/ solutions in hand so we can verify exactly what you are taking. This includes all medications from all  doctors and over the counters  ? ? ? ? ? ? ?

## 2021-09-13 NOTE — Telephone Encounter (Signed)
Called and spoke to patient. He was unsure of whether protonix and pepcid should have been sent to pharmacy. I let him know those are OTC and the only prescriptions sent to [facility] were the inhalers. Patient was also asking about prednisone. Let patient know Dr. Melvyn Novas said to finish prednisone he was prescribed by another provider instead of getting a new rx. Patient voiced understanding.  ? ?Also called [facility] and spoke to . She verified prescriptions were received but that they required a prior authorization. Patient notified and will route a message to PA team.  ?

## 2021-09-13 NOTE — Telephone Encounter (Signed)
Cancelled order for proair and sent in new order for ventolin inhaler.  ? ?Called [facility] and let them know to D/C Proair. Spoke to [place] and confirmed with her that proair will not be filled and to fill ventolin instead. Nothing further needed.  ?

## 2021-09-13 NOTE — Telephone Encounter (Signed)
I ran a test claim for Ventolin instead of the ProAir and got a paid claim.  $4 copay, no PA needed.  Just need a new script sent to the pharmacy to fill. ?

## 2021-09-13 NOTE — Telephone Encounter (Signed)
Per staff at [facility] and symbicort need a prior authorization.  ? ?Routing to PA team  ?

## 2021-09-13 NOTE — Telephone Encounter (Signed)
ok 

## 2021-09-14 ENCOUNTER — Encounter: Payer: Self-pay | Admitting: Internal Medicine

## 2021-09-14 NOTE — Assessment & Plan Note (Signed)
Onset ? 05/2021 with bilateral RN changes mostly in upper lobes 08/08/21  ?- Quant TB 08/15/2021  ?- ESR 08/15/2021  86 ?- ACE  08/15/2021 34  ?- 09/13/2021  After extensive coaching inhaler device,  effectiveness =   75% (short ti, earlytrigger) > try symbicort 80 2bid  ? ?ddx for cough is AB but ILD on cxr suggests possible sarcoid  - note convincing repeated response to steroids po > proceed with HRCT next and symbicort 80 trial  ? ?    ?  ? ?Each maintenance medication was reviewed in detail including emphasizing most importantly the difference between maintenance and prns and under what circumstances the prns are to be triggered using an action plan format where appropriate. ? ?Total time for H and P, chart review, counseling, reviewing hfa device(s) and generating customized AVS unique to this office visit / same day charting = 30 min  ?    ? ? ? ?

## 2021-09-20 LAB — SPUTUM CULTURE

## 2021-09-20 LAB — GRAM STAIN W/SPUTUM CULT RFLX

## 2021-09-20 LAB — ANAEROBIC/AEROBIC/GRAM STAIN

## 2021-09-20 LAB — SPECIMEN STATUS REPORT

## 2021-09-20 LAB — FUNGUS CULTURE W SMEAR

## 2021-09-23 ENCOUNTER — Ambulatory Visit (HOSPITAL_COMMUNITY)
Admission: RE | Admit: 2021-09-23 | Discharge: 2021-09-23 | Disposition: A | Discharge status: Home / Self Care | Payer: Medicaid Other | Source: Ambulatory Visit | Attending: Internal Medicine | Admitting: Internal Medicine

## 2021-09-23 DIAGNOSIS — R053 Chronic cough: Secondary | ICD-10-CM | POA: Insufficient documentation

## 2021-09-26 NOTE — Progress Notes (Signed)
? ? ?[address] ?[place], [place] [address] ? ? ?CLINIC:  ?Medical Oncology/Hematology ? ?CONSULT NOTE ? ?Patient Care Team: ?Coral Spikes, DO as PCP - General (Family Medicine) ? ?CHIEF COMPLAINTS/PURPOSE OF CONSULTATION:  ?Evaluation of leukocytosis and thrombocytosis. ? ?HISTORY OF PRESENTING ILLNESS:  ?Gregory Camacho 61 y.o. male is here at the request of his primary care provider (Dr. Thersa Salt) for evaluation of leukocytosis. ? ?Review of past labs shows intermittent leukocytosis since at least 2019 with WBC ranging from normal up to 30.7.  Leukocytosis is primarily neutrophil predominant.  Some of his previous elevations are explainable, such as elevated WBC in September 2022 during hospitalization for pneumonia and COPD exacerbation, was receiving high-dose steroids at the time.  Elevated WBC in February/March 2023 also occurred in conjunction with pneumonia and steroid use. ? ?He is also noted to have elevated platelets in the recent past with platelets peaking at 714 on 08/15/2021, trending down to 469 on 09/08/2021.  Patient had normal platelets prior to February 2023. ? ?As noted above, he has had recurrent pneumonia and COPD exacerbations requiring steroid treatment.  He finished his most recent course of prednisone last week.  He is a former smoker, reports that he quit about 3 months ago.  He denies any history of connective tissue disease, autoimmune process, or chronic inflammatory disease.  He has not noticed any new masses or lymphadenopathy.  He denies any unexplained fever, chills, or night sweats.  He denies any aquagenic pruritus, erythromelalgia, or vasomotor symptoms. ? ?He does report that he has been losing weight.  He reports that he is not eating well and has a decreased appetite, and that he "just cannot eat enough to gain weight."  Review of data shows that he has lost about 10 pounds in the past 2 years and has had some recent fluctuations in weight  secondary to steroids and acute illness. ? ?Patient reports appetite at about 50% with little to no energy. ? ? ?MEDICAL HISTORY:  ?Past Medical History:  ?Diagnosis Date  ? Depression   ? Sinus congestion   ? ? ?SURGICAL HISTORY: ?Past Surgical History:  ?Procedure Laterality Date  ? CYSTOSCOPY/RETROGRADE/URETEROSCOPY Bilateral 08/15/2018  ? Procedure: CYSTOSCOPY/RETROGRADE URETHROGRAM  Carolynn Serve AND PERINEUM IRRIGATION AND DEBRIDEMENT;  Surgeon: Ardis Hughs, MD;  Location: [facility];  Service: Urology;  Laterality: Bilateral;  ? ? ?SOCIAL HISTORY: ?Social History  ? ?Socioeconomic History  ? Marital status: Single  ?  Spouse name: Not on file  ? Number of children: Not on file  ? Years of education: Not on file  ? Highest education level: Not on file  ?Occupational History  ? Not on file  ?Tobacco Use  ? Smoking status: Former  ?  Packs/day: 1.00  ?  Years: 30.00  ?  Pack years: 30.00  ?  Types: Cigarettes  ?  Quit date: 05/2021  ?  Years since quitting: 0.3  ? Smokeless tobacco: Never  ?Vaping Use  ? Vaping Use: Never used  ?Substance and Sexual Activity  ? Alcohol use: Yes  ?  Alcohol/week: 2.0 standard drinks  ?  Types: 1 Cans of beer, 1 Shots of liquor per week  ?  Comment: occa  ? Drug use: Yes  ?  Types: Marijuana  ?  Comment: occassionally (last 05/23/18)  ? Sexual activity: Not on file  ?Other Topics Concern  ? Not on file  ?Social History Narrative  ? Not on file  ? ?  Social Determinants of Health  ? ?Financial Resource Strain: Not on file  ?Food Insecurity: Not on file  ?Transportation Needs: Not on file  ?Physical Activity: Not on file  ?Stress: Not on file  ?Social Connections: Not on file  ?Intimate Partner Violence: Not on file  ? ? ?FAMILY HISTORY: ?No family history on file. ? ?ALLERGIES:  has No Known Allergies. ? ?MEDICATIONS:  ?Current Outpatient Medications  ?Medication Sig Dispense Refill  ? albuterol (PROVENTIL) (2.5 MG/3ML) 0.083% nebulizer solution Take 3 mLs (2.5 mg total) by  nebulization every 6 (six) hours as needed for wheezing or shortness of breath. 75 mL 1  ? albuterol (VENTOLIN HFA) 108 (90 Base) MCG/ACT inhaler Inhale 2 puffs into the lungs every 6 (six) hours as needed for wheezing or shortness of breath. 18 g 6  ? budesonide-formoterol (SYMBICORT) 80-4.5 MCG/ACT inhaler Take 2 puffs first thing in am and then another 2 puffs about 12 hours later. 1 each 11  ? Iron, Ferrous Sulfate, 325 (65 Fe) MG TABS Take 325 mg by mouth every other day. 30 tablet 3  ? metFORMIN (GLUCOPHAGE) 500 MG tablet Take 1 tablet (500 mg total) by mouth 2 (two) times daily with a meal. 180 tablet 3  ? predniSONE (DELTASONE) 10 MG tablet Take 2 tablets (20 mg total) by mouth daily. 14 tablet 0  ? traZODone (DESYREL) 50 MG tablet Take 1 tablet (50 mg total) by mouth at bedtime. 90 tablet 1  ? ?No current facility-administered medications for this visit.  ? ? ?REVIEW OF SYSTEMS:   ?Review of Systems  ?Constitutional:  Positive for appetite change, fatigue and unexpected weight change. Negative for chills, diaphoresis and fever.  ?HENT:   Negative for lump/mass and nosebleeds.   ?Eyes:  Negative for eye problems.  ?Respiratory:  Positive for cough and shortness of breath. Negative for hemoptysis.   ?Cardiovascular:  Negative for chest pain, leg swelling and palpitations.  ?Gastrointestinal:  Positive for constipation, diarrhea and vomiting. Negative for abdominal pain, blood in stool and nausea.  ?Genitourinary:  Negative for hematuria.   ?Skin: Negative.   ?Neurological:  Positive for dizziness, headaches and numbness. Negative for light-headedness.  ?Hematological:  Does not bruise/bleed easily.   ? ? ?PHYSICAL EXAMINATION: ?ECOG PERFORMANCE STATUS: 1 - Symptomatic but completely ambulatory ? ?There were no vitals filed for this visit. ?There were no vitals filed for this visit. ? ?Physical Exam ?Constitutional:   ?   Appearance: Normal appearance. He is cachectic.  ?   Comments: Temporal muscle wasting  noted on exam.  ?HENT:  ?   Head: Normocephalic and atraumatic.  ?   Mouth/Throat:  ?   Mouth: Mucous membranes are moist.  ?Eyes:  ?   Extraocular Movements: Extraocular movements intact.  ?   Pupils: Pupils are equal, round, and reactive to light.  ?Cardiovascular:  ?   Rate and Rhythm: Regular rhythm. Tachycardia present.  ?   Pulses: Normal pulses.  ?   Heart sounds: Normal heart sounds.  ?Pulmonary:  ?   Effort: Pulmonary effort is normal.  ?   Breath sounds: Decreased air movement present. Rhonchi (faint, coarse breath sounds posterior lung fields) present.  ?Abdominal:  ?   General: Bowel sounds are normal.  ?   Palpations: Abdomen is soft.  ?   Tenderness: There is no abdominal tenderness.  ?Musculoskeletal:     ?   General: No swelling.  ?   Right lower leg: No edema.  ?   Left  lower leg: No edema.  ?Lymphadenopathy:  ?   Cervical: No cervical adenopathy.  ?Skin: ?   General: Skin is warm and dry.  ?Neurological:  ?   General: No focal deficit present.  ?   Mental Status: He is alert and oriented to person, place, and time.  ?Psychiatric:     ?   Mood and Affect: Mood normal.     ?   Behavior: Behavior normal.  ?  ? ? ?LABORATORY DATA:  ?I have reviewed the data as listed ?Recent Results (from the past 2160 hour(s))  ?CBC with Differential/Platelet     Status: Abnormal  ? Collection Time: 08/11/21  3:35 PM  ?Result Value Ref Range  ? WBC 20.0 (HH) 3.4 - 10.8 x10E3/uL  ? RBC 4.18 4.14 - 5.80 x10E6/uL  ? Hemoglobin 10.7 (L) 13.0 - 17.7 g/dL  ? Hematocrit 32.7 (L) 37.5 - 51.0 %  ? MCV 78 (L) 79 - 97 fL  ? MCH 25.6 (L) 26.6 - 33.0 pg  ? MCHC 32.7 31.5 - 35.7 g/dL  ? RDW 12.9 11.6 - 15.4 %  ? Platelets 621 (H) 150 - 450 x10E3/uL  ? Neutrophils 73 Not Estab. %  ? Lymphs 12 Not Estab. %  ? Monocytes 10 Not Estab. %  ? Eos 3 Not Estab. %  ? Basos 1 Not Estab. %  ? Neutrophils Absolute 15.1 (H) 1.4 - 7.0 x10E3/uL  ? Lymphocytes Absolute 2.3 0.7 - 3.1 x10E3/uL  ? Monocytes Absolute 1.9 (H) 0.1 - 0.9 x10E3/uL  ? EOS  (ABSOLUTE) 0.5 (H) 0.0 - 0.4 x10E3/uL  ? Basophils Absolute 0.1 0.0 - 0.2 x10E3/uL  ? Immature Granulocytes 1 Not Estab. %  ? Immature Grans (Abs) 0.1 0.0 - 0.1 x10E3/uL  ?QuantiFERON-TB Gold Plus     Status: None  ? [employer]

## 2021-09-27 ENCOUNTER — Inpatient Hospital Stay (HOSPITAL_COMMUNITY): Payer: Medicaid Other | Attending: Physician Assistant | Admitting: Hematology

## 2021-09-27 ENCOUNTER — Inpatient Hospital Stay (HOSPITAL_COMMUNITY): Payer: Medicaid Other

## 2021-09-27 VITALS — BP 144/88 | HR 99 | Temp 97.2°F | Resp 20 | Ht 67.72 in | Wt 120.2 lb

## 2021-09-27 DIAGNOSIS — Z8701 Personal history of pneumonia (recurrent): Secondary | ICD-10-CM | POA: Diagnosis not present

## 2021-09-27 DIAGNOSIS — D72829 Elevated white blood cell count, unspecified: Secondary | ICD-10-CM

## 2021-09-27 DIAGNOSIS — R634 Abnormal weight loss: Secondary | ICD-10-CM | POA: Diagnosis not present

## 2021-09-27 DIAGNOSIS — D75839 Thrombocytosis, unspecified: Secondary | ICD-10-CM | POA: Diagnosis not present

## 2021-09-27 DIAGNOSIS — R700 Elevated erythrocyte sedimentation rate: Secondary | ICD-10-CM | POA: Insufficient documentation

## 2021-09-27 DIAGNOSIS — F1090 Alcohol use, unspecified, uncomplicated: Secondary | ICD-10-CM | POA: Diagnosis not present

## 2021-09-27 DIAGNOSIS — F1421 Cocaine dependence, in remission: Secondary | ICD-10-CM

## 2021-09-27 DIAGNOSIS — E119 Type 2 diabetes mellitus without complications: Secondary | ICD-10-CM

## 2021-09-27 DIAGNOSIS — D509 Iron deficiency anemia, unspecified: Secondary | ICD-10-CM

## 2021-09-27 DIAGNOSIS — Z87891 Personal history of nicotine dependence: Secondary | ICD-10-CM | POA: Insufficient documentation

## 2021-09-27 DIAGNOSIS — Z809 Family history of malignant neoplasm, unspecified: Secondary | ICD-10-CM | POA: Diagnosis not present

## 2021-09-27 DIAGNOSIS — D72823 Leukemoid reaction: Secondary | ICD-10-CM

## 2021-09-27 DIAGNOSIS — J449 Chronic obstructive pulmonary disease, unspecified: Secondary | ICD-10-CM

## 2021-09-27 DIAGNOSIS — F1290 Cannabis use, unspecified, uncomplicated: Secondary | ICD-10-CM

## 2021-09-27 LAB — CBC WITH DIFFERENTIAL/PLATELET
Abs Immature Granulocytes: 0.03 10*3/uL (ref 0.00–0.07)
Basophils Absolute: 0.1 10*3/uL (ref 0.0–0.1)
Basophils Relative: 1 %
Eosinophils Absolute: 0.6 10*3/uL — ABNORMAL HIGH (ref 0.0–0.5)
Eosinophils Relative: 5 %
HCT: 36.3 % — ABNORMAL LOW (ref 39.0–52.0)
Hemoglobin: 11.2 g/dL — ABNORMAL LOW (ref 13.0–17.0)
Immature Granulocytes: 0 %
Lymphocytes Relative: 19 %
Lymphs Abs: 2 10*3/uL (ref 0.7–4.0)
MCH: 25.3 pg — ABNORMAL LOW (ref 26.0–34.0)
MCHC: 30.9 g/dL (ref 30.0–36.0)
MCV: 82.1 fL (ref 80.0–100.0)
Monocytes Absolute: 0.8 10*3/uL (ref 0.1–1.0)
Monocytes Relative: 7 %
Neutro Abs: 7.1 10*3/uL (ref 1.7–7.7)
Neutrophils Relative %: 68 %
Platelets: 455 10*3/uL — ABNORMAL HIGH (ref 150–400)
RBC: 4.42 MIL/uL (ref 4.22–5.81)
RDW: 17.1 % — ABNORMAL HIGH (ref 11.5–15.5)
WBC: 10.6 10*3/uL — ABNORMAL HIGH (ref 4.0–10.5)
nRBC: 0 % (ref 0.0–0.2)

## 2021-09-27 LAB — C-REACTIVE PROTEIN: CRP: 0.9 mg/dL (ref <1.0)

## 2021-09-27 LAB — LACTATE DEHYDROGENASE: LDH: 170 U/L (ref 98–192)

## 2021-09-27 LAB — SEDIMENTATION RATE: Sed Rate: 57 mm/hr — ABNORMAL HIGH (ref 0–16)

## 2021-09-27 NOTE — Patient Instructions (Signed)
[facility] at [facility] ?Discharge Instructions ? ?You were seen today by Dr. Ellin Saba & Rojelio Brenner PA-C for your elevated platelets and elevated white blood cells.  These are most likely related to your pneumonia, steroid medication, and acute illness.  However, we will check additional labs today to make sure there is not anything else causing your white blood cells to be high. ? ?FOLLOW-UP APPOINTMENT: Office visit in 3 weeks to discuss results ? ? ?Thank you for choosing [facility] at [facility] to provide your oncology and hematology care.  To afford each patient quality time with our provider, please arrive at least 15 minutes before your scheduled appointment time.  ? ?If you have a lab appointment with the Cancer Center please come in thru the Main Entrance and check in at the main information desk. ? ?You need to re-schedule your appointment should you arrive 10 or more minutes late.  We strive to give you quality time with our providers, and arriving late affects you and other patients whose appointments are after yours.  Also, if you no show three or more times for appointments you may be dismissed from the clinic at the providers discretion.     ?Again, thank you for choosing [facility].  Our hope is that these requests will decrease the amount of time that you wait before being seen by our physicians.       ?_____________________________________________________________ ? ?Should you have questions after your visit to [facility], please contact our office at [phone] and follow the prompts.  Our office hours are 8:00 a.m. and 4:30 p.m. Monday - Friday.  Please note that voicemails left after 4:00 p.m. may not be returned until the following business day.  We are closed weekends and major holidays.  You do have access to a nurse 24-7, just call the main number to the clinic [phone] and do not press any  options, hold on the line and a nurse will answer the phone.   ? ?For prescription refill requests, have your pharmacy contact our office and allow 72 hours.   ? ?Due to Covid, you will need to wear a mask upon entering the hospital. If you do not have a mask, a mask will be given to you at the Main Entrance upon arrival. For doctor visits, patients may have 1 support person age 76 or older with them. For treatment visits, patients can not have anyone with them due to social distancing guidelines and our immunocompromised population.  ? ? ? ?

## 2021-09-28 LAB — RHEUMATOID FACTOR: Rheumatoid fact SerPl-aCnc: 14 IU/mL — ABNORMAL HIGH (ref <14.0)

## 2021-09-28 LAB — ANA: Anti Nuclear Antibody (ANA): NEGATIVE

## 2021-09-30 LAB — BCR-ABL1 FISH
Cells Analyzed: 200
Cells Counted: 200

## 2021-10-03 ENCOUNTER — Ambulatory Visit: Payer: Medicaid Other | Admitting: Family Medicine

## 2021-10-03 ENCOUNTER — Encounter: Payer: Self-pay | Admitting: Family Medicine

## 2021-10-07 ENCOUNTER — Ambulatory Visit (INDEPENDENT_AMBULATORY_CARE_PROVIDER_SITE_OTHER): Payer: Medicaid Other | Admitting: Family Medicine

## 2021-10-07 VITALS — BP 147/68 | HR 103 | Temp 98.2°F | Ht 67.72 in | Wt 116.2 lb

## 2021-10-07 DIAGNOSIS — J449 Chronic obstructive pulmonary disease, unspecified: Secondary | ICD-10-CM

## 2021-10-07 DIAGNOSIS — R634 Abnormal weight loss: Secondary | ICD-10-CM | POA: Diagnosis not present

## 2021-10-07 DIAGNOSIS — G4700 Insomnia, unspecified: Secondary | ICD-10-CM

## 2021-10-07 DIAGNOSIS — E119 Type 2 diabetes mellitus without complications: Secondary | ICD-10-CM

## 2021-10-07 LAB — MISC LABCORP TEST (SEND OUT): Labcorp test code: 489555

## 2021-10-07 LAB — JAK2 V617F, W REFLEX TO CALR/E12/MPL: Director Review, JAK2: 489555

## 2021-10-07 NOTE — Progress Notes (Signed)
? ?Subjective:  ?Patient ID: Gregory Camacho, male    DOB: 02-Aug-1960  Age: 61 y.o. MRN: [id number] ? ?CC: ?Chief Complaint  ?Patient presents with  ? Follow-up  ? ? ?HPI: ? ?61 year old male with COPD, CT findings consistent with interstitial pneumonitis, type 2 diabetes, insomnia, unintentional weight loss, iron deficiency anemia presents for follow-up. ? ?Patient continues to struggle with his weight.  He has lost 3 pounds since his last visit with me.  We are still awaiting his appointment to GI.  He needs colonoscopy. ? ?Patient states that his breathing is improved.  He is compliant with Symbicort. ? ?Patient is tolerating metformin well.  He did have some diarrhea today.  He has not had any recently. ? ?Trazodone is working well for sleep. ? ?Patient Active Problem List  ? Diagnosis Date Noted  ? Unintentional weight loss 10/07/2021  ? Iron deficiency anemia 09/02/2021  ? Diabetes mellitus without complication (HCC) 09/02/2021  ? Thrombocytosis 09/02/2021  ? Leukocytosis 09/02/2021  ? COPD (chronic obstructive pulmonary disease) (HCC) 09/01/2021  ? Insomnia 09/01/2021  ? Chronic cough 08/11/2021  ? Drug abuse, marijuana 02/26/2021  ? Drug abuse, cocaine type (HCC) 02/26/2021  ? ? ?Social Hx   ?Social History  ? ?Socioeconomic History  ? Marital status: Single  ?  Spouse name: Not on file  ? Number of children: Not on file  ? Years of education: Not on file  ? Highest education level: Not on file  ?Occupational History  ? Not on file  ?Tobacco Use  ? Smoking status: Former  ?  Packs/day: 1.00  ?  Years: 30.00  ?  Pack years: 30.00  ?  Types: Cigarettes  ?  Quit date: 05/2021  ?  Years since quitting: 0.3  ? Smokeless tobacco: Never  ?Vaping Use  ? Vaping Use: Never used  ?Substance and Sexual Activity  ? Alcohol use: Yes  ?  Alcohol/week: 2.0 standard drinks  ?  Types: 1 Cans of beer, 1 Shots of liquor per week  ?  Comment: occa  ? Drug use: Yes  ?  Types: Marijuana  ?  Comment: occassionally (last 05/23/18)  ?  Sexual activity: Not on file  ?Other Topics Concern  ? Not on file  ?Social History Narrative  ? Not on file  ? ?Social Determinants of Health  ? ?Financial Resource Strain: Not on file  ?Food Insecurity: Not on file  ?Transportation Needs: Not on file  ?Physical Activity: Not on file  ?Stress: Not on file  ?Social Connections: Not on file  ? ? ?Review of Systems  ?Constitutional:  Positive for unexpected weight change.  ?Respiratory:  Positive for cough.   ? ?Objective:  ?BP (!) 147/68   Pulse (!) 103   Temp 98.2 ?F (36.8 ?C) (Oral)   Ht 5' 7.72" (1.72 m)   Wt 116 lb 3.2 oz (52.7 kg)   SpO2 93%   BMI 17.81 kg/m?  ? ? ?  10/07/2021  ?  1:09 PM 09/27/2021  ?  8:42 AM 09/13/2021  ? 10:01 AM  ?BP/Weight  ?Systolic BP 147 144 132  ?Diastolic BP 68 88 72  ?Wt. (Lbs) 116.2 120.15 118.6  ?BMI 17.81 kg/m2 18.42 kg/m2 18.03 kg/m2  ? ? ?Physical Exam ?Vitals and nursing note reviewed.  ?Constitutional:   ?   General: He is not in acute distress. ?HENT:  ?   Head: Normocephalic and atraumatic.  ?Eyes:  ?   General:     ?  Right eye: No discharge.     ?   Left eye: No discharge.  ?   Conjunctiva/sclera: Conjunctivae normal.  ?Cardiovascular:  ?   Rate and Rhythm: Regular rhythm. Tachycardia present.  ?Pulmonary:  ?   Effort: Pulmonary effort is normal.  ?   Breath sounds: No wheezing or rales.  ?Abdominal:  ?   General: There is no distension.  ?   Palpations: Abdomen is soft.  ?   Tenderness: There is no abdominal tenderness.  ?Neurological:  ?   Mental Status: He is alert.  ?Psychiatric:     ?   Mood and Affect: Mood normal.     ?   Behavior: Behavior normal.  ? ? ?Lab Results  ?Component Value Date  ? WBC 10.6 (H) 09/27/2021  ? HGB 11.2 (L) 09/27/2021  ? HCT 36.3 (L) 09/27/2021  ? PLT 455 (H) 09/27/2021  ? GLUCOSE 107 (H) 09/08/2021  ? CHOL 145 09/01/2021  ? TRIG 43 09/01/2021  ? HDL 72 09/01/2021  ? LDLCALC 63 09/01/2021  ? ALT 20 09/08/2021  ? AST 21 09/08/2021  ? NA 137 09/08/2021  ? K 4.4 09/08/2021  ? CL 99  09/08/2021  ? CREATININE 0.99 09/08/2021  ? BUN 22 09/08/2021  ? CO2 29 09/08/2021  ? HGBA1C 6.9 (H) 09/01/2021  ? ? ? ?Assessment & Plan:  ? ?Problem List Items Addressed This Visit   ? ?  ? Respiratory  ? COPD (chronic obstructive pulmonary disease) (HCC) (Chronic)  ?  Stable and improved.  Continue Symbicort. ?  ?  ?  ? Endocrine  ? Diabetes mellitus without complication (HCC)  ?  Stable.  Continue metformin. ?  ?  ?  ? Other  ? Insomnia  ?  Stable continue trazodone. ?  ?  ? Unintentional weight loss - Primary  ?  Patient has had quite extensive work-up thus far.  This could be related to his COPD.  However given his iron deficiency anemia, he needs colonoscopy.  Nursing staff has reached out and he has an appointment in May.  TSH today to ensure no underlying hyperthyroidism.  Patient has had chest x-ray, laboratory studies including inflammatory markers, TB, hepatitis C, HIV. ?  ?  ? Relevant Orders  ? TSH + free T4  ? ?Follow-up:  Return in about 3 months (around 01/06/2022). ? ?Everlene Other DO ?[facility] ? ?

## 2021-10-07 NOTE — Assessment & Plan Note (Signed)
Patient has had quite extensive work-up thus far.  This could be related to his COPD.  However given his iron deficiency anemia, he needs colonoscopy.  Nursing staff has reached out and he has an appointment in May.  TSH today to ensure no underlying hyperthyroidism.  Patient has had chest x-ray, laboratory studies including inflammatory markers, TB, hepatitis C, HIV. ?

## 2021-10-07 NOTE — Assessment & Plan Note (Signed)
Stable continue trazodone. 

## 2021-10-07 NOTE — Assessment & Plan Note (Signed)
Stable and improved.  Continue Symbicort. ?

## 2021-10-07 NOTE — Assessment & Plan Note (Signed)
Stable.  Continue metformin. 

## 2021-10-07 NOTE — Patient Instructions (Signed)
Increase your protein intake (chicken, fish, steak). ? ?Recommend 3 meals/day and snacks in between.  Avoid lots of processed sugars that will increase her blood sugar levels. ? ?1 lab test today. ? ?I am working on the colonoscopy. ? ?Follow up in 3 months. ?

## 2021-10-17 NOTE — Progress Notes (Signed)
? ?[facility] ?[address] ?[place], [place] [address] ? ? ?CLINIC:  ?Medical Oncology/Hematology ? ?PCP:  ?Coral Spikes, DO ?[place] [id number] ?[phone] ? ? ?REASON FOR VISIT:  ?Follow-up for leukocytosis and thrombocytosis ? ?CURRENT THERAPY: Under work-up ? ?INTERVAL HISTORY:  ?Gregory Camacho 61 y.o. male returns for routine follow-up of his leukocytosis and thrombocytosis.  He was seen for initial consultation by Tarri Abernethy PA-C and Dr. Delton Coombes on 09/27/2021. ? ?At today's visit, he reports feeling improved.  He feels that his respiratory symptoms are improving, although he does still have persistent cough.  He denies any fever, chills, or night sweats.  He has not had any prednisone or other steroids since his last appointment. ? ?He has 70% energy and 80% appetite. He endorses that he is maintaining a stable weight. ? ? ?REVIEW OF SYSTEMS:  ?Review of Systems  ?Constitutional:  Positive for fatigue. Negative for appetite change, chills, diaphoresis, fever and unexpected weight change.  ?HENT:   Positive for trouble swallowing (Difficulty chewing). Negative for lump/mass and nosebleeds.   ?Eyes:  Negative for eye problems.  ?Respiratory:  Positive for cough and shortness of breath. Negative for hemoptysis.   ?Cardiovascular:  Negative for chest pain, leg swelling and palpitations.  ?Gastrointestinal:  Positive for constipation, diarrhea and nausea. Negative for abdominal pain, blood in stool and vomiting.  ?Genitourinary:  Positive for difficulty urinating ("Slow flow"). Negative for hematuria.   ?Skin: Negative.   ?Neurological:  Positive for dizziness and headaches. Negative for light-headedness.  ?Hematological:  Does not bruise/bleed easily.  ?Psychiatric/Behavioral:  Positive for depression and sleep disturbance. The patient is nervous/anxious.    ? ? ?PAST MEDICAL/SURGICAL HISTORY:  ?Past Medical History:  ?Diagnosis Date  ? Depression   ? Sinus congestion    ? ?Past Surgical History:  ?Procedure Laterality Date  ? CYSTOSCOPY/RETROGRADE/URETEROSCOPY Bilateral 08/15/2018  ? Procedure: CYSTOSCOPY/RETROGRADE URETHROGRAM  Carolynn Serve AND PERINEUM IRRIGATION AND DEBRIDEMENT;  Surgeon: Ardis Hughs, MD;  Location: [facility];  Service: Urology;  Laterality: Bilateral;  ? ? ? ?SOCIAL HISTORY:  ?Social History  ? ?Socioeconomic History  ? Marital status: Single  ?  Spouse name: Not on file  ? Number of children: Not on file  ? Years of education: Not on file  ? Highest education level: Not on file  ?Occupational History  ? Not on file  ?Tobacco Use  ? Smoking status: Former  ?  Packs/day: 1.00  ?  Years: 30.00  ?  Pack years: 30.00  ?  Types: Cigarettes  ?  Quit date: 05/2021  ?  Years since quitting: 0.3  ? Smokeless tobacco: Never  ?Vaping Use  ? Vaping Use: Never used  ?Substance and Sexual Activity  ? Alcohol use: Yes  ?  Alcohol/week: 2.0 standard drinks  ?  Types: 1 Cans of beer, 1 Shots of liquor per week  ?  Comment: occa  ? Drug use: Yes  ?  Types: Marijuana  ?  Comment: occassionally (last 05/23/18)  ? Sexual activity: Not on file  ?Other Topics Concern  ? Not on file  ?Social History Narrative  ? Not on file  ? ?Social Determinants of Health  ? ?Financial Resource Strain: Not on file  ?Food Insecurity: Not on file  ?Transportation Needs: Not on file  ?Physical Activity: Not on file  ?Stress: Not on file  ?Social Connections: Not on file  ?Intimate Partner Violence: Not on file  ? ? ?  FAMILY HISTORY:  ?No family history on file. ? ?CURRENT MEDICATIONS:  ?Outpatient Encounter Medications as of 10/18/2021  ?Medication Sig  ? albuterol (PROVENTIL) (2.5 MG/3ML) 0.083% nebulizer solution Take 3 mLs (2.5 mg total) by nebulization every 6 (six) hours as needed for wheezing or shortness of breath. (Patient not taking: Reported on 10/07/2021)  ? albuterol (VENTOLIN HFA) 108 (90 Base) MCG/ACT inhaler Inhale 2 puffs into the lungs every 6 (six) hours as needed for wheezing or  shortness of breath.  ? budesonide-formoterol (SYMBICORT) 80-4.5 MCG/ACT inhaler Take 2 puffs first thing in am and then another 2 puffs about 12 hours later.  ? famotidine (PEPCID) 20 MG tablet Take 20 mg by mouth daily.  ? Iron, Ferrous Sulfate, 325 (65 Fe) MG TABS Take 325 mg by mouth every other day.  ? metFORMIN (GLUCOPHAGE) 500 MG tablet Take 1 tablet (500 mg total) by mouth 2 (two) times daily with a meal.  ? pantoprazole (PROTONIX) 40 MG tablet Take 40 mg by mouth daily.  ? traZODone (DESYREL) 50 MG tablet Take 1 tablet (50 mg total) by mouth at bedtime.  ? ?No facility-administered encounter medications on file as of 10/18/2021.  ? ? ?ALLERGIES:  ?Allergies  ?Allergen Reactions  ? Bc Fast Pain Relief [Aspirin-Salicylamide-Caffeine]   ?  Upset stomach  ? ? ? ?PHYSICAL EXAM:  ?ECOG PERFORMANCE STATUS: 1 - Symptomatic but completely ambulatory ? ?There were no vitals filed for this visit. ?There were no vitals filed for this visit. ?Physical Exam ?Constitutional:   ?   Appearance: Normal appearance. He is cachectic.  ?   Comments: Temporal muscle wasting noted on exam.  ?HENT:  ?   Head: Normocephalic and atraumatic.  ?   Mouth/Throat:  ?   Mouth: Mucous membranes are moist.  ?Eyes:  ?   Extraocular Movements: Extraocular movements intact.  ?   Pupils: Pupils are equal, round, and reactive to light.  ?Cardiovascular:  ?   Rate and Rhythm: Regular rhythm. Tachycardia present.  ?   Pulses: Normal pulses.  ?   Heart sounds: Normal heart sounds.  ?   Comments: Heart rate 94 ?Pulmonary:  ?   Effort: Pulmonary effort is normal.  ?   Breath sounds: Decreased air movement present. No wheezing or rhonchi.  ?   Comments: Poor air movement in all lung fields. ?Abdominal:  ?   General: Bowel sounds are normal.  ?   Palpations: Abdomen is soft.  ?   Tenderness: There is no abdominal tenderness.  ?Musculoskeletal:     ?   General: No swelling.  ?   Right lower leg: No edema.  ?   Left lower leg: No edema.  ?Lymphadenopathy:   ?   Cervical: No cervical adenopathy.  ?Skin: ?   General: Skin is warm and dry.  ?Neurological:  ?   General: No focal deficit present.  ?   Mental Status: He is alert and oriented to person, place, and time.  ?Psychiatric:     ?   Mood and Affect: Mood normal.     ?   Behavior: Behavior normal.  ? ? ? ?LABORATORY DATA:  ?I have reviewed the labs as listed.  ?CBC ?   ?Component Value Date/Time  ? WBC 10.6 (H) 09/27/2021 1014  ? RBC 4.42 09/27/2021 1014  ? HGB 11.2 (L) 09/27/2021 1014  ? HGB 11.6 (L) 09/01/2021 0924  ? HCT 36.3 (L) 09/27/2021 1014  ? HCT 36.1 (L) 09/01/2021 0924  ? PLT  455 (H) 09/27/2021 1014  ? PLT 691 (H) 09/01/2021 [id number]  ? MCV 82.1 09/27/2021 1014  ? MCV 79 09/01/2021 0924  ? MCH 25.3 (L) 09/27/2021 1014  ? MCHC 30.9 09/27/2021 1014  ? RDW 17.1 (H) 09/27/2021 1014  ? RDW 13.4 09/01/2021 0924  ? LYMPHSABS 2.0 09/27/2021 1014  ? LYMPHSABS 1.7 09/01/2021 0924  ? MONOABS 0.8 09/27/2021 1014  ? EOSABS 0.6 (H) 09/27/2021 1014  ? EOSABS 0.8 (H) 09/01/2021 [id number]  ? BASOSABS 0.1 09/27/2021 1014  ? BASOSABS 0.1 09/01/2021 0924  ? ? ?  Latest Ref Rng & Units 09/08/2021  ?  8:58 AM 09/02/2021  ? 12:04 PM 09/01/2021  ?  9:24 AM  ?CMP  ?Glucose 70 - 99 mg/dL 107   134   102    ?BUN 8 - 23 mg/dL 22   16   13     ?Creatinine 0.61 - 1.24 mg/dL 0.99   0.79   0.80    ?Sodium 135 - 145 mmol/L 137   138   142    ?Potassium 3.5 - 5.1 mmol/L 4.4   5.3   6.0    ?Chloride 98 - 111 mmol/L 99   99   100    ?CO2 22 - 32 mmol/L 29   33   30    ?Calcium 8.9 - 10.3 mg/dL 8.7   8.7   9.7    ?Total Protein 6.5 - 8.1 g/dL 8.0    8.0    ?Total Bilirubin 0.3 - 1.2 mg/dL 0.4    <0.2    ?Alkaline Phos 38 - 126 U/L 79    96    ?AST 15 - 41 U/L 21    21    ?ALT 0 - 44 U/L 20    19    ? ? ?DIAGNOSTIC IMAGING:  ?I have independently reviewed the relevant imaging and discussed with the patient. ? ?ASSESSMENT & PLAN: ?1.  Leukocytosis and thrombocytosis ?- Review of past labs shows intermittent leukocytosis since at least 2019 with WBC ranging  from normal up to 30.7.  Leukocytosis is primarily neutrophil predominant.  Elevated WBC in September 2022 during hospitalization for pneumonia and COPD exacerbation, was receiving high-dose steroids at the t

## 2021-10-18 ENCOUNTER — Inpatient Hospital Stay (HOSPITAL_BASED_OUTPATIENT_CLINIC_OR_DEPARTMENT_OTHER): Payer: Medicaid Other | Admitting: Physician Assistant

## 2021-10-18 VITALS — BP 123/71 | HR 94 | Temp 98.4°F | Resp 18 | Ht 67.72 in | Wt 117.9 lb

## 2021-10-18 DIAGNOSIS — D72823 Leukemoid reaction: Secondary | ICD-10-CM

## 2021-10-18 DIAGNOSIS — D509 Iron deficiency anemia, unspecified: Secondary | ICD-10-CM

## 2021-10-18 DIAGNOSIS — D75839 Thrombocytosis, unspecified: Secondary | ICD-10-CM | POA: Diagnosis not present

## 2021-10-18 DIAGNOSIS — D72829 Elevated white blood cell count, unspecified: Secondary | ICD-10-CM | POA: Diagnosis not present

## 2021-10-18 NOTE — Patient Instructions (Signed)
[facility] ?Discharge Instructions ? ?You were seen today by Rojelio Brenner PA-C for your elevated white blood cells and elevated platelets.  The labs that we checked at your last visit did not show any signs of blood cancer.  I believe that your elevated white blood cells and elevated platelets are "reactive," meaning that they were caused by your COPD and lung disease.  In fact, your blood counts look better already! ? ?We will check your labs again and see you for follow-up in 6 months. ? ? ?Thank you for choosing [facility] at [facility] to provide your oncology and hematology care.  To afford each patient quality time with our provider, please arrive at least 15 minutes before your scheduled appointment time.  ? ?If you have a lab appointment with the Cancer Center please come in thru the Main Entrance and check in at the main information desk. ? ?You need to re-schedule your appointment should you arrive 10 or more minutes late.  We strive to give you quality time with our providers, and arriving late affects you and other patients whose appointments are after yours.  Also, if you no show three or more times for appointments you may be dismissed from the clinic at the providers discretion.     ?Again, thank you for choosing [facility].  Our hope is that these requests will decrease the amount of time that you wait before being seen by our physicians.       ?_____________________________________________________________ ? ?Should you have questions after your visit to [facility], please contact our office at [phone] and follow the prompts.  Our office hours are 8:00 a.m. and 4:30 p.m. Monday - Friday.  Please note that voicemails left after 4:00 p.m. may not be returned until the following business day.  We are closed weekends and major holidays.  You do have access to a nurse 24-7, just call the main number to  the clinic [phone] and do not press any options, hold on the line and a nurse will answer the phone.   ? ?For prescription refill requests, have your pharmacy contact our office and allow 72 hours.   ? ?Due to Covid, you will need to wear a mask upon entering the hospital. If you do not have a mask, a mask will be given to you at the Main Entrance upon arrival. For doctor visits, patients may have 1 support person age 55 or older with them. For treatment visits, patients can not have anyone with them due to social distancing guidelines and our immunocompromised population.  ? ? ? ?

## 2021-10-25 ENCOUNTER — Ambulatory Visit (HOSPITAL_COMMUNITY)
Admission: RE | Admit: 2021-10-25 | Discharge: 2021-10-25 | Disposition: A | Discharge status: Home / Self Care | Payer: Medicaid Other | Source: Ambulatory Visit | Attending: Internal Medicine | Admitting: Internal Medicine

## 2021-10-25 DIAGNOSIS — R053 Chronic cough: Secondary | ICD-10-CM

## 2021-10-25 LAB — PULMONARY FUNCTION TEST
DL/VA % pred: 76 %
DL/VA: 3.22 ml/min/mmHg/L
DLCO unc % pred: 55 %
DLCO unc: 13.7 ml/min/mmHg
FEF 25-75 Post: 1.29 L/sec
FEF 25-75 Pre: 0.75 L/sec
FEF2575-%Change-Post: 71 %
FEF2575-%Pred-Post: 51 %
FEF2575-%Pred-Pre: 30 %
FEV1-%Change-Post: 16 %
FEV1-%Pred-Post: 65 %
FEV1-%Pred-Pre: 55 %
FEV1-Post: 1.76 L
FEV1-Pre: 1.51 L
FEV1FVC-%Change-Post: 1 %
FEV1FVC-%Pred-Pre: 81 %
FEV6-%Change-Post: 11 %
FEV6-%Pred-Post: 78 %
FEV6-%Pred-Pre: 70 %
FEV6-Post: 2.65 L
FEV6-Pre: 2.38 L
FEV6FVC-%Change-Post: -2 %
FEV6FVC-%Pred-Post: 101 %
FEV6FVC-%Pred-Pre: 104 %
FVC-%Change-Post: 14 %
FVC-%Pred-Post: 77 %
FVC-%Pred-Pre: 67 %
FVC-Post: 2.73 L
FVC-Pre: 2.38 L
Post FEV1/FVC ratio: 64 %
Post FEV6/FVC ratio: 97 %
Pre FEV1/FVC ratio: 63 %
Pre FEV6/FVC Ratio: 100 %
RV % pred: 208 %
RV: 4.45 L
TLC % pred: 106 %
TLC: 6.84 L

## 2021-10-25 MED ORDER — ALBUTEROL SULFATE (2.5 MG/3ML) 0.083% IN NEBU
2.5000 mg | INHALATION_SOLUTION | Freq: Once | RESPIRATORY_TRACT | Status: AC
Start: 2021-10-25 — End: 2021-10-25
  Administered 2021-10-25: 2.5 mg via RESPIRATORY_TRACT

## 2021-11-01 ENCOUNTER — Ambulatory Visit (INDEPENDENT_AMBULATORY_CARE_PROVIDER_SITE_OTHER): Payer: Medicaid Other | Admitting: Internal Medicine

## 2021-11-01 ENCOUNTER — Encounter: Payer: Self-pay | Admitting: Internal Medicine

## 2021-11-01 DIAGNOSIS — R053 Chronic cough: Secondary | ICD-10-CM

## 2021-11-01 DIAGNOSIS — J449 Chronic obstructive pulmonary disease, unspecified: Secondary | ICD-10-CM

## 2021-11-01 NOTE — Patient Instructions (Addendum)
Plan A = Automatic = Always=    symbicort 80 Take 2 puffs first thing in am and then another 2 puffs about 12 hours later.  ? ?Work on inhaler technique:  relax and gently blow all the way out then take a nice smooth full deep breath back in, triggering the inhaler at same time you start breathing in.  Hold for up to 5 seconds if you can. Blow out thru nose. Rinse and gargle with water when done.  If mouth or throat bother you at all,  try brushing teeth/gums/tongue with arm and hammer toothpaste/ make a slurry and gargle and spit out.  ? ?   ?Plan B = Backup (to supplement plan A, not to replace it) ?Only use your albuterol inhaler as a rescue medication to be used if you can't catch your breath by resting or doing a relaxed purse lip breathing pattern.  ?- The less you use it, the better it will work when you need it. ?- Ok to use the inhaler up to 2 puffs  every 4 hours if you must but call for appointment if use goes up over your usual need ?- Don't leave home without it !!  (think of it like the spare tire for your car)  ? ?We will call to schedule you a sinus CT   ? ?Please schedule a follow up office visit in 6 -8  weeks, call sooner if needed with all medications /inhalers/ solutions in hand so we can verify exactly what you are taking. This includes all medications from all doctors and over the counters  ?

## 2021-11-01 NOTE — Progress Notes (Signed)
? ?GRAVIEL PAYEUR, male    DOB: October 18, 1960   MRN: [id number] ? ? 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 ?Says never had resp problems prior to Dec 2022 ? ? ?Admit date: 02/25/2021 ?Discharge date: 02/28/2021 ?  ? Pneumonia with elevated PCT/ neg cultures ?- Continue current antibiotics course 5 to 7 days.  Will adjust antibiotics if we obtain positive sputum culture. ?- DuoNeb QID ?-Solu-Medrol 60 mg BID ?- Expiratory spirometry ?- Flutter valve ?  ?Depression ?-Not on home meds ?  ?Nicotine abuse ?- Nicotine patch ?  ?Drug abuse ?- 9/2 urine rapid drug screen positive cocaine and marijuana ?-9/3 Counseling of resources available to stop use of marijuana and cocaine ?  ?Goals of care ?- 9/4 consult to LCSW: Patient requests to obtain a list of PCPs accepting new patients in town prior to discharge most likely in the [place] ?NOTE: Also would be of benefit to have list of resources to help in discontinuing use of illegal substances ?  ? ? ? ? ?History of Present Illness  ?08/11/2021  Pulmonary/ 1st office eval/ Sherene Sires / [facility]  ?Chief Complaint  ?Patient presents with  ? Consult  ?  Chronic productive cough X approx.  1 month   ?Dyspnea:   limited by back not breathing  ?Cough: more when lie down assoc with watery pnds x 20 years > mucoid more than purulent  ?Sleep: on side / sleeps on couch  ?SABA use:  not helping  ?Rec ?Pantoprazole (protonix) 40 mg   Take  30-60 min before first meal of the day and Pepcid (famotidine)  20 mg after supper until return to office  ?For cough > mucinex dm 1200 mg every 12 hours (over the counter)  ?GERD diet reviewed, bed blocks rec  ?Depomedrol 80mg  IM  ?We will schedule a CT of your chest and call the results   ?Please schedule a follow up office visit in 4 weeks, sooner if needed with all medications in hand   ? ? ? ? ?09/13/2021  f/u ov/[place] office/Tajae Rybicki re: cough x 20 y esp hs  maint on ?  (did not bring meds as req but reports "always better on prednisone)  ?Chief Complaint  ?Patient presents with  ? Follow-up  ?  Patient feels better overall but is still coughing up mucus.   ?Dyspnea:  back is better and says not limited by breathing at this point  ?Cough: p supper and hs / mucoid but better on pred ?Sleeping: 3 pillows /no bed blocks / says wakes up around 3am coughing each noct ?SABA use: avg every 4 -5 h ?02: none  ?Covid status: vax x 2  ?Rec ?Pantoprazole (protonix) 40 mg   Take  30-60 min before first meal of the day and Pepcid (famotidine)  20 mg after supper until return to office  ?For cough > mucinex dm 1200 mg every 12 hours (over the counter)  ?GERD diet reviewed, bed blocks rec  ?Prednisone 10 mg take  4 each am x 2 days,   2 each am x 2 days,  1 each am x 2 days and stop  ?Start symbicort 80 Take 2 puffs first thing in am and then another 2 puffs about 12 hours later.  ? Only use your albuterol as  a rescue medication to be used if you can't catch your breath  ?Pfts next available ?Please schedule a follow up office visit in 6 weeks, call sooner if needed with all medications /inhalers/ solutions in hand so we can verify exactly what you are taking. This includes all medications from all doctors and over the counters  ? ? ?11/01/2021  f/u ov/[place] office/Yaretzy Olazabal re: cough x 2000  maint on saba prn   ?Chief Complaint  ?Patient presents with  ? Follow-up  ?  Still coughing up light colored mucus.   ?Needs refill on rescue inhaler   ? Dyspnea:  as long not coughing his breathing is fine  ?Cough: worse in am tends to wake up prematurely - worse since quit smoking  ?Tends to be dark was green now yellow  ?Sleeping:  flat on couch with 2-3 pillows under head  ?SABA use: does seem to help using ever 4 hourshas not picked up symb 80 yet ?02: not using 02  ?  ?Lung cancer screening: not due until  09/24/22   ? ? ?No obvious day to day or daytime variability or assoc  mucus plugs or hemoptysis or cp or chest tightness, subjective wheeze or overt sinus or hb symptoms.  ? ? Also denies any obvious fluctuation of symptoms with weather or environmental changes or other aggravating or alleviating factors except as outlined above  ? ?No unusual exposure hx or h/o childhood pna/ asthma or knowledge of premature birth. ? ?Current Allergies, Complete Past Medical History, Past Surgical History, Family History, and Social History were reviewed in [employer] record. ? ?ROS  The following are not active complaints unless bolded ?Hoarseness, sore throat, dysphagia, dental problems, itching, sneezing,  nasal congestion or discharge of excess mucus or purulent secretions, ear ache,   fever, chills, sweats, unintended wt loss or wt gain, classically pleuritic or exertional cp,  orthopnea pnd or arm/hand swelling  or leg swelling, presyncope, palpitations, abdominal pain, anorexia, nausea, vomiting, diarrhea  or change in bowel habits or change in bladder habits, change in stools or change in urine, dysuria, hematuria,  rash, arthralgias, visual complaints, headache, numbness, weakness or ataxia or problems with walking or coordination,  change in mood or  memory. ?      ? ?Current Meds - - NOTE:   Unable to verify as accurately reflecting what pt takes    ?Medication Sig  ? albuterol (PROVENTIL) (2.5 MG/3ML) 0.083% nebulizer solution Take 3 mLs (2.5 mg total) by nebulization every 6 (six) hours as needed for wheezing or shortness of breath.  ? albuterol (VENTOLIN HFA) 108 (90 Base) MCG/ACT inhaler Inhale 2 puffs into the lungs every 6 (six) hours as needed for wheezing or shortness of breath.  ? budesonide-formoterol (SYMBICORT) 80-4.5 MCG/ACT inhaler Take 2 puffs first thing in am and then another 2 puffs about 12 hours later.  ? famotidine (PEPCID) 20 MG tablet Take 20 mg by mouth daily.  ? Iron, Ferrous  Sulfate, 325 (65 Fe) MG TABS Take 325 mg by mouth every other day.  ? metFORMIN (GLUCOPHAGE) 500 MG tablet Take 1 tablet (500 mg total) by mouth 2 (two) times daily with a meal.  ? pantoprazole (PROTONIX) 40 MG tablet Take 40 mg by mouth daily.  ? traZODone (DESYREL) 50 MG tablet Take 1 tablet (50 mg total) by mouth at bedtime.  ?     ? ? ? ?  ?  ? ? ?Objective:  ?  ?wts ? ?11/01/2021  115  ?09/13/2021       118   ?08/11/21 121 lb (54.9 kg)  ?08/08/21 141 lb 1.5 oz (64 kg)  ?03/03/21 140 lb (63.5 kg)  ?  ?Vital signs reviewed  11/01/2021  - Note at rest 02 sats  98% on RA  ? ?General appearance:    amb bm nad nasal tone to voice  ? ? ?HEENT : poor dentition/ cobblestoning of oropharynx, mod tubinate edema secretions mucoid  ? ?NECK :  without JVD/Nodes/TM/ nl carotid upstrokes bilaterally ? ? ?LUNGS: no acc muscle use,  Min barrel  contour chest wall with bilateral  slightly decreased bs s audible wheeze and  without cough on insp or exp maneuvers and min  Hyperresonant  to  percussion bilaterally   ? ? ?CV:  RRR  no s3 or murmur or increase in P2, and no edema  ? ?ABD:  soft and nontender with pos end  insp Hoover's  in the supine position. No bruits or organomegaly appreciated, bowel sounds nl ? ?MS:   Nl gait/  ext warm without deformities, calf tenderness, cyanosis or clubbing ?No obvious joint restrictions  ? ?SKIN: warm and dry without lesions   ? ?NEURO:  alert, approp, nl sensorium with  no motor or cerebellar deficits apparent.  ?    ? ?   ? ?  ?  ? ?   ? ? ?   ?Assessment  ? ?  ?   ?

## 2021-11-02 ENCOUNTER — Encounter: Payer: Self-pay | Admitting: Internal Medicine

## 2021-11-02 NOTE — Assessment & Plan Note (Signed)
Quit smoking Jan 2023  ?- PFT's  10/25/21  FEV1 1.76 (65 % ) ratio 0.64  p 16 % improvement from saba p 0 prior to study with DLCO  13.7 (55%) corrects to  75%   for alv volume and FV curve c/w mild obst    ?- 11/01/2021  After extensive coaching inhaler device,  effectiveness =    75% (short Ti)  rx symbicort 80 2bid ? 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 Underlying sinusitis so rx start with symb 80 2bid and add consider next adding spiriva for   Group D (now reclassified as E) in terms of symptom/risk but first needs to understand how and when to use maint vs prns -see avs for instructions unique to this ov ?  ?  ?

## 2021-11-02 NOTE — Assessment & Plan Note (Addendum)
Onset ? 05/2021 with bilateral RN changes mostly in upper lobes 08/08/21  ?- Quant TB 08/15/2021  ?- ESR 08/15/2021  86 ?- ACE  08/15/2021 34  ?- 09/13/2021  After extensive coaching inhaler device,  effectiveness =   75% (short ti, earlytrigger) > try symbicort 80 2bid  ?- CT 09/23/21  C/w DIP from smoking (quit 06/2021) ?-  Sinus CT 11/01/2021 >>>  ? ?Assoc with poor dentition and persistently purulent sputum production s bronchiectasis that I had suspected so sinuses may be the source > sinus CT next then ENT eval if indicated.  ? ?Discussed in detail all the  indications, usual  risks and alternatives  relative to the benefits with patient who agrees to proceed with w/u as outlined.    ? ?    ?  ? ?Each maintenance medication was reviewed in detail including emphasizing most importantly the difference between maintenance and prns and under what circumstances the prns are to be triggered using an action plan format where appropriate. ? ?Total time for H and P, chart review, counseling, reviewing hfa/neb device(s) and generating customized AVS unique to this office visit / same day charting =32 min  ?     ?

## 2021-11-04 ENCOUNTER — Telehealth: Payer: Self-pay | Admitting: Internal Medicine

## 2021-11-04 ENCOUNTER — Other Ambulatory Visit: Payer: Self-pay

## 2021-11-04 MED ORDER — ALBUTEROL SULFATE HFA 108 (90 BASE) MCG/ACT IN AERS: 2.0000 | INHALATION_SPRAY | Freq: Four times a day (QID) | RESPIRATORY_TRACT | 6 refills | Status: DC | PRN

## 2021-11-04 NOTE — Telephone Encounter (Signed)
Refilled albuterol for patient. Nothing further needed ?  ?

## 2021-11-06 ENCOUNTER — Other Ambulatory Visit: Payer: Self-pay | Admitting: Internal Medicine

## 2021-11-07 ENCOUNTER — Ambulatory Visit: Payer: Self-pay

## 2021-11-07 ENCOUNTER — Telehealth: Payer: Self-pay | Admitting: *Deleted

## 2021-11-07 NOTE — Telephone Encounter (Signed)
Tried to call pt x 2 times for 3:00 nurse visit by phone.  Had to leave a voice mail. ?

## 2021-11-10 ENCOUNTER — Ambulatory Visit (INDEPENDENT_AMBULATORY_CARE_PROVIDER_SITE_OTHER): Payer: Self-pay | Admitting: *Deleted

## 2021-11-10 VITALS — Ht 67.5 in | Wt 117.0 lb

## 2021-11-10 DIAGNOSIS — Z1211 Encounter for screening for malignant neoplasm of colon: Secondary | ICD-10-CM

## 2021-11-10 NOTE — Progress Notes (Signed)
Gastroenterology Pre-Procedure Review  Request Date: 11/10/2021 Requesting Physician: Dr. Everlene Other @ [facility], no previous TCS  PATIENT REVIEW QUESTIONS: The patient responded to the following health history questions as indicated:    1. Diabetes Melitis: yes, type II  2. Joint replacements in the past 12 months: no 3. Major health problems in the past 3 months: yes, COPD 4. Has an artificial valve or MVP: no 5. Has a defibrillator: no 6. Has been advised in past to take antibiotics in advance of a procedure like teeth cleaning: no 7. Family history of colon cancer: no  8. Alcohol Use: yes, 1 drink every 2-3 weeks 9. Illicit drug Use: no, quit recently 10. History of sleep apnea: no  11. History of coronary artery or other vascular stents placed within the last 12 months: no 12. History of any prior anesthesia complications: no 13. Body mass index is 18.05 kg/m.    MEDICATIONS & ALLERGIES:    Patient reports the following regarding taking any blood thinners:   Plavix? no Aspirin? no Coumadin? no Brilinta? no Xarelto? no Eliquis? no Pradaxa? no Savaysa? no Effient? no  Patient confirms/reports the following medications:  Current Outpatient Medications  Medication Sig Dispense Refill   albuterol (PROVENTIL) (2.5 MG/3ML) 0.083% nebulizer solution Take 3 mLs (2.5 mg total) by nebulization every 6 (six) hours as needed for wheezing or shortness of breath. (Patient taking differently: Take 2.5 mg by nebulization as needed for wheezing or shortness of breath.) 75 mL 1   albuterol (VENTOLIN HFA) 108 (90 Base) MCG/ACT inhaler Inhale 2 puffs into the lungs every 6 (six) hours as needed for wheezing or shortness of breath. 18 g 6   famotidine (PEPCID) 20 MG tablet Take 20 mg by mouth daily.     Iron, Ferrous Sulfate, 325 (65 Fe) MG TABS Take 325 mg by mouth every other day. 30 tablet 3   metFORMIN (GLUCOPHAGE) 500 MG tablet Take 1 tablet (500 mg total) by mouth 2  (two) times daily with a meal. 180 tablet 3   pantoprazole (PROTONIX) 40 MG tablet TAKE 1 TABLET(40 MG) BY MOUTH DAILY 30 TO 60 MINUTES BEFORE FIRST MEAL OF THE DAY 30 tablet 2   traZODone (DESYREL) 50 MG tablet Take 1 tablet (50 mg total) by mouth at bedtime. 90 tablet 1   No current facility-administered medications for this visit.    Patient confirms/reports the following allergies:  Allergies  Allergen Reactions   Bc Fast Pain Relief [Aspirin-Salicylamide-Caffeine]     Upset stomach    No orders of the defined types were placed in this encounter.   AUTHORIZATION INFORMATION Primary Insurance: Medicaid [place] Access,  ID #: [id number] Pre-Cert / Auth required: No, not required   SCHEDULE INFORMATION: Procedure has been scheduled as follows:  Date: , Time:   Location: [facility] with Dr. Marletta Lor  This Gastroenterology Pre-Precedure Review Form is being routed to the following provider(s): Tana Coast, PA-C

## 2021-11-22 NOTE — Progress Notes (Signed)
Given more recent issues with COPD in the last few months, let's arrange for an ov prior to scheduling.   You can use one of my virtual spots but have patient come in to be seen.

## 2021-11-22 NOTE — Progress Notes (Signed)
Spoke to pt.  Made him aware that ov needed due to COPD issues.  Scheduled ov for 11/28/2021 at 2:30.

## 2021-11-28 ENCOUNTER — Ambulatory Visit: Payer: Medicaid Other | Admitting: Gastroenterology

## 2021-11-28 ENCOUNTER — Encounter: Payer: Self-pay | Admitting: Gastroenterology

## 2021-11-28 ENCOUNTER — Other Ambulatory Visit: Payer: Self-pay

## 2021-11-28 VITALS — BP 138/60 | HR 101 | Temp 97.8°F | Ht 67.0 in | Wt 114.0 lb

## 2021-11-28 DIAGNOSIS — R634 Abnormal weight loss: Secondary | ICD-10-CM

## 2021-11-28 DIAGNOSIS — D509 Iron deficiency anemia, unspecified: Secondary | ICD-10-CM

## 2021-11-28 DIAGNOSIS — R1310 Dysphagia, unspecified: Secondary | ICD-10-CM

## 2021-11-28 MED ORDER — CLENPIQ 10-3.5-12 MG-GM -GM/175ML PO SOLN
1.0000 | ORAL | 0 refills | Status: DC
Start: 2021-11-28 — End: 2021-12-15

## 2021-11-28 NOTE — Patient Instructions (Addendum)
Upper endoscopy and colonoscopy to be scheduled. See separate instructions.  If you are having trouble with hard stool, you can use miralax one capful once to twice daily as needed to help with bowel movements.

## 2021-11-28 NOTE — Progress Notes (Signed)
GI Office Note    Referring Provider: Tommie Sams, DO Primary Care Physician:  Tommie Sams, DO  Primary Gastroenterologist: Hennie Duos. Marletta Lor, DO   Chief Complaint   Chief Complaint  Patient presents with   Colonoscopy     History of Present Illness   Gregory Camacho is a 61 y.o. male presenting today to schedule screening colonoscopy at the request of PCP, Dr. Everlene Other.  He has a history of COPD, chronic cough, CT findings with interstitial pneumonitis followed by pulmonology. He is also followed by hematology for leukocytosis and thrombocytosis, IDA.  Patient admitted in 02/2021 with pneumonia. UDS positive for cocaine and marijuana in 02/2021. He has had some breathing issues since then and following with pulmonology. Patient weighed 121 pounds in 07/2021.  Says his baseline before getting sick in 02/2021, was in the 130 range. Weighed 117 pounds on 11/10/21. Weighs 114 pounds today.   Overall good appetite. He is not sure why he is losing weight. He purchased Boost last week and has started drinking in addition to his regular diet. He has a lot of dental issues. Currently with only few teeth on top. Supposed to get full upper plate soon. He has noted issues swallow, gets strangled on occasion. Feels like esophagus closes up when he goes to swallow liquids at times. Less problems with soft food then regular food. Has had a productive cough since 02/2021. Shortness of breath has improved. Follows closely with pulmonology. He consumes etoh on occasion. Has used crack cocaine intermittently, last time was one month ago. Complains of fatigue. Sometimes stools hard. Most of the times stools good. No melena, brbpr.    Medications   Current Outpatient Medications  Medication Sig Dispense Refill   albuterol (VENTOLIN HFA) 108 (90 Base) MCG/ACT inhaler Inhale 2 puffs into the lungs every 6 (six) hours as needed for wheezing or shortness of breath. 18 g 6   famotidine (PEPCID) 20 MG  tablet Take 20 mg by mouth daily.     Iron, Ferrous Sulfate, 325 (65 Fe) MG TABS Take 325 mg by mouth every other day. 30 tablet 3   metFORMIN (GLUCOPHAGE) 500 MG tablet Take 1 tablet (500 mg total) by mouth 2 (two) times daily with a meal. 180 tablet 3   pantoprazole (PROTONIX) 40 MG tablet TAKE 1 TABLET(40 MG) BY MOUTH DAILY 30 TO 60 MINUTES BEFORE FIRST MEAL OF THE DAY 30 tablet 2   traZODone (DESYREL) 50 MG tablet Take 1 tablet (50 mg total) by mouth at bedtime. 90 tablet 1   No current facility-administered medications for this visit.    Allergies   Allergies as of 11/28/2021 - Review Complete 11/28/2021  Allergen Reaction Noted   Bc fast pain relief [aspirin-salicylamide-caffeine]  10/07/2021    Past Medical History   Past Medical History:  Diagnosis Date   COPD (chronic obstructive pulmonary disease) (HCC)    Depression    Sinus congestion     Past Surgical History   Past Surgical History:  Procedure Laterality Date   CYSTOSCOPY/RETROGRADE/URETEROSCOPY Bilateral 08/15/2018   Procedure: CYSTOSCOPY/RETROGRADE URETHROGRAM  Karen Kays AND PERINEUM IRRIGATION AND DEBRIDEMENT;  Surgeon: Crist Fat, MD;  Location: [facility];  Service: Urology;  Laterality: Bilateral;    Past Family History   Family History  Problem Relation Age of Onset   Hypertension Mother    Colon cancer Neg Hx     Past Social History   Social History   Socioeconomic History  Marital status: Single    Spouse name: Not on file   Number of children: Not on file   Years of education: Not on file   Highest education level: Not on file  Occupational History   Not on file  Tobacco Use   Smoking status: Former    Packs/day: 1.00    Years: 30.00    Pack years: 30.00    Types: Cigarettes    Quit date: 05/2021    Years since quitting: 0.5   Smokeless tobacco: Never  Vaping Use   Vaping Use: Never used  Substance and Sexual Activity   Alcohol use: Yes    Alcohol/week: 4.0 standard drinks     Types: 4 Cans of beer per week    Comment: occa   Drug use: Not Currently    Types: Marijuana    Comment: occassionally (last 05/23/18)   Sexual activity: Not on file  Other Topics Concern   Not on file  Social History Narrative   Not on file   Social Determinants of Health   Financial Resource Strain: Not on file  Food Insecurity: Not on file  Transportation Needs: Not on file  Physical Activity: Not on file  Stress: Not on file  Social Connections: Not on file  Intimate Partner Violence: Not on file    Review of Systems   General: Negative for anorexia, fever, chills, weakness. See hpi Eyes: Negative for vision changes.  ENT: Negative for hoarseness, nasal congestion. see hpi CV: Negative for chest pain, angina, palpitations, dyspnea on exertion, peripheral edema.  Respiratory: Negative for dyspnea at rest, dyspnea on exertion,  wheezing. See hpi GI: See history of present illness. GU:  Negative for dysuria, hematuria, urinary incontinence, urinary frequency, nocturnal urination.  MS: Negative for joint pain, low back pain.  Derm: Negative for rash or itching.  Neuro: Negative for weakness, abnormal sensation, seizure, frequent headaches, memory loss,  confusion.  Psych: Negative for anxiety, depression, suicidal ideation, hallucinations.  Endo: Negative for unusual weight change. See hpi Heme: Negative for bruising or bleeding. Allergy: Negative for rash or hives.  Physical Exam   BP 138/60 (BP Location: Right Arm, Patient Position: Sitting, Cuff Size: Normal)   Pulse (!) 101   Temp 97.8 F (36.6 C) (Temporal)   Ht 5\' 7"  (1.702 m)   Wt 114 lb (51.7 kg)   SpO2 96%   BMI 17.85 kg/m    General: thin, well-developed in no acute distress.  Head: Normocephalic, atraumatic.   Eyes: Conjunctiva pink, no icterus. Mouth: Oropharyngeal mucosa moist and pink , no lesions erythema or exudate. Neck: Supple without thyromegaly, masses, or lymphadenopathy.  Lungs:  Clear to auscultation bilaterally.  Heart: Regular rate and rhythm, no murmurs rubs or gallops.  Abdomen: Bowel sounds are normal, nontender, nondistended, no hepatosplenomegaly or masses,  no abdominal bruits or hernia, no rebound or guarding.   Rectal: not performed Extremities: No lower extremity edema. No clubbing or deformities.  Neuro: Alert and oriented x 4 , grossly normal neurologically.  Skin: Warm and dry, no rash or jaundice.   Psych: Alert and cooperative, normal mood and affect.  Labs   Lab Results  Component Value Date   CREATININE 0.99 09/08/2021   BUN 22 09/08/2021   NA 137 09/08/2021   K 4.4 09/08/2021   CL 99 09/08/2021   CO2 29 09/08/2021   Lab Results  Component Value Date   ALT 20 09/08/2021   AST 21 09/08/2021   ALKPHOS 79 09/08/2021  BILITOT 0.4 09/08/2021   Lab Results  Component Value Date   WBC 10.6 (H) 09/27/2021   HGB 11.2 (L) 09/27/2021   HCT 36.3 (L) 09/27/2021   MCV 82.1 09/27/2021   PLT 455 (H) 09/27/2021   Lab Results  Component Value Date   IRON 22 (L) 09/01/2021   TIBC 261 09/01/2021   FERRITIN 363 09/01/2021   Lab Results  Component Value Date   VITAMINB12 >2000 (H) 09/01/2021   Lab Results  Component Value Date   FOLATE >20.0 09/01/2021   Lab Results  Component Value Date   HGBA1C 6.9 (H) 09/01/2021      Imaging Studies   No results found.  Assessment   Weight loss: unintentional, in setting of PNA/lung disease. Appetite good but has some vague swallowing complaints and also with poor dentition and food choices limited due to this. No prior colonoscopy. Would recommend excluding GI source for his weight loss.   Dysphagia: vague symptoms, likely multifactorial in setting of poor dentition, possible oropharyngeal component, cannot exclude esophageal stricture.  IDA: followed by hematology, no prior colonoscopy or EGD.    PLAN   EGD/ED/colonoscopy with Dr.Carver. ASA 3.  I have discussed the risks,  alternatives, benefits with regards to but not limited to the risk of reaction to medication, bleeding, infection, perforation and the patient is agreeable to proceed. Written consent to be obtained. Patient advised to abstain from cocaine use, last use one month ago.    Leanna BattlesLeslie S. Melvyn NethLewis, MHS, PA-C [facility]

## 2021-12-08 ENCOUNTER — Ambulatory Visit (HOSPITAL_COMMUNITY): Payer: Medicaid Other

## 2021-12-13 ENCOUNTER — Ambulatory Visit: Payer: Medicaid Other | Admitting: Internal Medicine

## 2021-12-15 ENCOUNTER — Ambulatory Visit (INDEPENDENT_AMBULATORY_CARE_PROVIDER_SITE_OTHER): Payer: Medicaid Other | Admitting: Internal Medicine

## 2021-12-15 ENCOUNTER — Encounter: Payer: Self-pay | Admitting: Internal Medicine

## 2021-12-15 DIAGNOSIS — J449 Chronic obstructive pulmonary disease, unspecified: Secondary | ICD-10-CM | POA: Diagnosis not present

## 2021-12-15 DIAGNOSIS — J84117 Desquamative interstitial pneumonia: Secondary | ICD-10-CM | POA: Diagnosis not present

## 2021-12-15 DIAGNOSIS — R053 Chronic cough: Secondary | ICD-10-CM | POA: Diagnosis not present

## 2021-12-15 MED ORDER — BUDESONIDE-FORMOTEROL FUMARATE 80-4.5 MCG/ACT IN AERO: INHALATION_SPRAY | RESPIRATORY_TRACT | 12 refills | Status: DC

## 2021-12-15 NOTE — Patient Instructions (Addendum)
Plan A = Automatic = Always=    symbicort 80 Take 2 puffs first thing in am and then another 2 puffs about 12 hours later.   Work on inhaler technique:  relax and gently blow all the way out then take a nice smooth full deep breath back in, triggering the inhaler at same time you start breathing in.  Hold for up to 5 seconds if you can. Blow out thru nose. Rinse and gargle with water when done.  If mouth or throat bother you at all,  try brushing teeth/gums/tongue with arm and hammer toothpaste/ make a slurry and gargle and spit out.      Plan B = Backup (to supplement plan A, not to replace it) Only use your albuterol inhaler as a rescue medication to be used if you can't catch your breath by resting or doing a relaxed purse lip breathing pattern.  - The less you use it, the better it will work when you need it. - Ok to use the inhaler up to 2 puffs  every 4 hours if you must but call for appointment if use goes up over your usual need - Don't leave home without it !!  (think of it like the spare tire for your car)   We will call to schedule you a sinus CT  - was already ordered   Please schedule a follow up office visit in  3 months call sooner if needed with all your inhalers

## 2021-12-15 NOTE — Progress Notes (Unsigned)
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  Will adjust antibiotics if we obtain positive sputum culture. - DuoNeb QID -Solu-Medrol 60 mg BID - Expiratory spirometry - Flutter valve   Depression -Not on home meds   Nicotine abuse - Nicotine patch   Drug abuse - 9/2 urine rapid drug screen positive cocaine and marijuana -9/3 Counseling of resources available to stop use of marijuana and cocaine   Goals of care - 9/4 consult to LCSW: Patient requests to obtain a list of PCPs accepting new patients in town prior to discharge most likely in the [place] NOTE: Also would be of benefit to have list of resources to help in discontinuing use of illegal substances       History of Present Illness  08/11/2021  Pulmonary/ 1st office eval/ Keller Mikels / [name]  Chief Complaint  Patient presents with   Consult    Chronic productive cough X approx.  1 month   Dyspnea:   limited by back not breathing  Cough: more when lie down assoc with watery pnds x 20 years > mucoid more than purulent  Sleep: on side / sleeps on couch  SABA use:  not helping  Rec Pantoprazole (protonix) 40 mg   Take  30-60 min before first meal of the day and Pepcid (famotidine)  20 mg after supper until return to office  For cough > mucinex dm 1200 mg every 12 hours (over the counter)  GERD diet reviewed, bed blocks rec  Depomedrol 80mg  IM  We will schedule a CT of your chest and call the results   Please schedule a follow up office visit in 4 weeks, sooner if needed with all medications in hand       09/13/2021  f/u ov/[name] office/[name] re: cough x 20 y esp hs  maint on ?  (did not bring meds as req but reports "always better on prednisone)  Chief Complaint  Patient presents with   Follow-up    Patient feels better overall but is still coughing up mucus.   Dyspnea:  back is better and says not limited by breathing at this point  Cough: p supper and hs / mucoid but better on pred Sleeping: 3 pillows /no bed blocks / says wakes up around 3am coughing each noct SABA use: avg every 4 -5 h 02: none  Covid status: vax x 2  Rec Pantoprazole (protonix) 40 mg   Take  30-60 min before first meal of the day and Pepcid (famotidine)  20 mg after supper until return to office  For cough > mucinex dm 1200 mg every 12 hours (over the counter)  GERD diet reviewed, bed blocks rec  Prednisone 10 mg take  4 each am x 2 days,   2 each am x 2 days,  1 each am x 2 days and stop  Start symbicort 80 Take 2 puffs first thing in am and then another 2 puffs about 12 hours later.   Only use your albuterol as  a rescue medication to be used if you can't catch your breath  Pfts next available Please schedule a follow up office visit in 6 weeks, call sooner if needed with all medications /inhalers/ solutions in hand so we can verify exactly what you are taking. This includes all medications from all doctors and over the counters    11/01/2021  f/u ov/[place] office/[name] re: cough x 2000  maint on saba prn   Chief Complaint  Patient presents with   Follow-up    Still coughing up light colored mucus.   Needs refill on rescue inhaler    Dyspnea:  as long not coughing his breathing is fine  Cough: worse in am tends to wake up prematurely - worse since quit smoking  Tends to be dark was green now yellow  Sleeping:  flat on couch with 2-3 pillows under head  SABA use: does seem to help using ever 4 hourshas not picked up symb 80 yet 02: not using 02  Lung cancer screening: not due until  09/24/22   Rec Plan A = Automatic = Always=    Symbicort 80 Take 2 puffs first thing in am and then another 2 puffs about 12 hours later.  Work on inhaler technique:   Plan B = Backup (to supplement plan A, not to replace it) Only use your albuterol inhaler as a rescue medication   We will call to schedule you a sinus CT  > note done    12/15/2021  f/u ov/[place] office/[name] re: cough x 2000  maint on ***  Chief Complaint  Patient presents with   Follow-up    Cough is on and off getting better. Still coughing up green/light colored sputum  Dyspnea:  says not limited by breathing  Cough: 3 x daily / worse in ams  SABA use: ventolin 4-5 x daily  02: none  Covid status: vax x 2      No obvious day to day or daytime variability or assoc excess/ purulent sputum or mucus plugs or hemoptysis or cp or chest tightness, subjective wheeze or overt sinus or hb symptoms.   *** without nocturnal  or early am exacerbation  of respiratory  c/o's or need for noct saba. Also denies any obvious fluctuation of symptoms with weather or environmental changes or other aggravating or alleviating factors except as outlined above   No unusual exposure hx or h/o childhood pna/ asthma or knowledge of premature birth.  Current Allergies, Complete Past Medical History, Past Surgical History, Family History, and Social History were reviewed in [employer] record.  ROS  The following are not active complaints unless bolded Hoarseness, sore throat, dysphagia, dental problems, itching, sneezing,  nasal congestion or discharge of excess mucus or purulent secretions, ear ache,   fever, chills, sweats, unintended wt loss or wt gain, classically pleuritic or exertional cp,  orthopnea pnd or arm/hand swelling  or leg swelling, presyncope, palpitations, abdominal pain, anorexia, nausea, vomiting, diarrhea  or change in bowel habits or change in bladder habits, change in stools or change in urine, dysuria,  hematuria,  rash, arthralgias, visual complaints, headache, numbness, weakness or ataxia or problems with walking or coordination,  change in mood or  memory.        Current Meds  Medication Sig   albuterol (VENTOLIN HFA) 108 (90 Base) MCG/ACT inhaler Inhale 2 puffs into the lungs every 6 (six) hours as needed for wheezing or shortness of breath.   famotidine (PEPCID) 20 MG  tablet Take 20 mg by mouth daily.   Iron, Ferrous Sulfate, 325 (65 Fe) MG TABS Take 325 mg by mouth every other day.   metFORMIN (GLUCOPHAGE) 500 MG tablet Take 1 tablet (500 mg total) by mouth 2 (two) times daily with a meal.   pantoprazole (PROTONIX) 40 MG tablet TAKE 1 TABLET(40 MG) BY MOUTH DAILY 30 TO 60 MINUTES BEFORE FIRST MEAL OF THE DAY   traZODone (DESYREL) 50 MG tablet Take 1 tablet (50 mg total) by mouth at bedtime.                  Objective:    wts  12/15/2021       ***  11/01/2021         115  09/13/2021       118   08/11/21 121 lb (54.9 kg)  08/08/21 141 lb 1.5 oz (64 kg)  03/03/21 140 lb (63.5 kg)     Vital signs reviewed  12/15/2021  - Note at rest 02 sats  ***% on ***   General appearance:    amb bm nad    Poor dentition  slt coarsening Min bar ***                Assessment

## 2021-12-16 ENCOUNTER — Encounter: Payer: Self-pay | Admitting: Internal Medicine

## 2021-12-16 DIAGNOSIS — J84117 Desquamative interstitial pneumonia: Secondary | ICD-10-CM | POA: Insufficient documentation

## 2021-12-28 NOTE — Patient Instructions (Signed)
Your procedure is scheduled on: 01/02/2022  Report to [facility] Main Entrance at   8:00  AM.  Call this number if you have problems the morning of surgery: [phone]   Remember:              Follow Directions on the letter you received from Your Physician's office regarding the Bowel Prep              No Smoking the day of Procedure :   Take these medicines the morning of surgery with A SIP OF WATER: pepcid and pantoprazole  Use inhalers if needed   Do not wear jewelry, make-up or nail polish.    Do not bring valuables to the hospital.  Contacts, dentures or bridgework may not be worn into surgery.  .   Patients discharged the day of surgery will not be allowed to drive home.     Colonoscopy, Adult, Care After This sheet gives you information about how to care for yourself after your procedure. Your health care provider may also give you more specific instructions. If you have problems or questions, contact your health care provider. What can I expect after the procedure? After the procedure, it is common to have: A small amount of blood in your stool for 24 hours after the procedure. Some gas. Mild abdominal cramping or bloating.  Follow these instructions at home: General instructions  For the first 24 hours after the procedure: Do not drive or use machinery. Do not sign important documents. Do not drink alcohol. Do your regular daily activities at a slower pace than normal. Eat soft, easy-to-digest foods. Rest often. Take over-the-counter or prescription medicines only as told by your health care provider. It is up to you to get the results of your procedure. Ask your health care provider, or the department performing the procedure, when your results will be ready. Relieving cramping and bloating Try walking around when you have cramps or feel bloated. Apply heat to your abdomen as told by your health care provider. Use a heat source that your health care  provider recommends, such as a moist heat pack or a heating pad. Place a towel between your skin and the heat source. Leave the heat on for 20-30 minutes. Remove the heat if your skin turns bright red. This is especially important if you are unable to feel pain, heat, or cold. You may have a greater risk of getting burned. Eating and drinking Drink enough fluid to keep your urine clear or pale yellow. Resume your normal diet as instructed by your health care provider. Avoid heavy or fried foods that are hard to digest. Avoid drinking alcohol for as long as instructed by your health care provider. Contact a health care provider if: You have blood in your stool 2-3 days after the procedure. Get help right away if: You have more than a small spotting of blood in your stool. You pass large blood clots in your stool. Your abdomen is swollen. You have nausea or vomiting. You have a fever. You have increasing abdominal pain that is not relieved with medicine. This information is not intended to replace advice given to you by your health care provider. Make sure you discuss any questions you have with your health care provider. Document Released: 01/25/2004 Document Revised: 03/06/2016 Document Reviewed: 08/24/2015 Elsevier Interactive Patient Education  2018 Elsevier Inc.  Upper Endoscopy, Adult, Care After This sheet gives you information about how to care for yourself after your  procedure. Your health care provider may also give you more specific instructions. If you have problems or questions, contact your health care provider. What can I expect after the procedure? After the procedure, it is common to have: A sore throat. Mild stomach pain or discomfort. Bloating. Nausea. Follow these instructions at home:  Follow instructions from your health care provider about what to eat or drink after your procedure. Return to your normal activities as told by your health care provider. Ask your  health care provider what activities are safe for you. Take over-the-counter and prescription medicines only as told by your health care provider. If you were given a sedative during the procedure, it can affect you for several hours. Do not drive or operate machinery until your health care provider says that it is safe. Keep all follow-up visits as told by your health care provider. This is important. Contact a health care provider if you have: A sore throat that lasts longer than one day. Trouble swallowing. Get help right away if: You vomit blood or your vomit looks like coffee grounds. You have: A fever. Bloody, black, or tarry stools. A severe sore throat or you cannot swallow. Difficulty breathing. Severe pain in your chest or abdomen. Summary After the procedure, it is common to have a sore throat, mild stomach discomfort, bloating, and nausea. If you were given a sedative during the procedure, it can affect you for several hours. Do not drive or operate machinery until your health care provider says that it is safe. Follow instructions from your health care provider about what to eat or drink after your procedure. Return to your normal activities as told by your health care provider. This information is not intended to replace advice given to you by your health care provider. Make sure you discuss any questions you have with your health care provider. Document Revised: 04/18/2019 Document Reviewed: 11/12/2017 Elsevier Patient Education  2023 Elsevier Inc. Esophageal Dilatation Esophageal dilatation, also called esophageal dilation, is a procedure to widen or open a blocked or narrowed part of the esophagus. The esophagus is the part of the body that moves food and liquid from the mouth to the stomach. You may need this procedure if: You have a buildup of scar tissue in your esophagus that makes it difficult, painful, or impossible to swallow. This can be caused by gastroesophageal  reflux disease (GERD). You have cancer of the esophagus. There is a problem with how food moves through your esophagus. In some cases, you may need this procedure repeated at a later time to dilate the esophagus gradually. Tell a health care provider about: Any allergies you have. All medicines you are taking, including vitamins, herbs, eye drops, creams, and over-the-counter medicines. Any problems you or family members have had with anesthetic medicines. Any blood disorders you have. Any surgeries you have had. Any medical conditions you have. Any antibiotic medicines you are required to take before dental procedures. Whether you are pregnant or may be pregnant. What are the risks? Generally, this is a safe procedure. However, problems may occur, including: Bleeding due to a tear in the lining of the esophagus. A hole, or perforation, in the esophagus. What happens before the procedure? Ask your health care provider about: Changing or stopping your regular medicines. This is especially important if you are taking diabetes medicines or blood thinners. Taking medicines such as aspirin and ibuprofen. These medicines can thin your blood. Do not take these medicines unless your health care provider  tells you to take them. Taking over-the-counter medicines, vitamins, herbs, and supplements. Follow instructions from your health care provider about eating or drinking restrictions. Plan to have a responsible adult take you home from the hospital or clinic. Plan to have a responsible adult care for you for the time you are told after you leave the hospital or clinic. This is important. What happens during the procedure? You may be given a medicine to help you relax (sedative). A numbing medicine may be sprayed into the back of your throat, or you may gargle the medicine. Your health care provider may perform the dilatation using various surgical instruments, such as: Simple dilators. This  instrument is carefully placed in the esophagus to stretch it. Guided wire bougies. This involves using an endoscope to insert a wire into the esophagus. A dilator is passed over this wire to enlarge the esophagus. Then the wire is removed. Balloon dilators. An endoscope with a small balloon is inserted into the esophagus. The balloon is inflated to stretch the esophagus and open it up. The procedure may vary among health care providers and hospitals. What can I expect after the procedure? Your blood pressure, heart rate, breathing rate, and blood oxygen level will be monitored until you leave the hospital or clinic. Your throat may feel slightly sore and numb. This will get better over time. You will not be allowed to eat or drink until your throat is no longer numb. When you are able to drink, urinate, and sit on the edge of the bed without nausea or dizziness, you may be able to return home. Follow these instructions at home: Take over-the-counter and prescription medicines only as told by your health care provider. If you were given a sedative during the procedure, it can affect you for several hours. Do not drive or operate machinery until your health care provider says that it is safe. Plan to have a responsible adult care for you for the time you are told. This is important. Follow instructions from your health care provider about any eating or drinking restrictions. Do not use any products that contain nicotine or tobacco, such as cigarettes, e-cigarettes, and chewing tobacco. If you need help quitting, ask your health care provider. Keep all follow-up visits. This is important. Contact a health care provider if: You have a fever. You have pain that is not relieved by medicine. Get help right away if: You have chest pain. You have trouble breathing. You have trouble swallowing. You vomit blood. You have black, tarry, or bloody stools. These symptoms may represent a serious problem  that is an emergency. Do not wait to see if the symptoms will go away. Get medical help right away. Call your local emergency services (911 in the [place] Do not drive yourself to the hospital. Summary Esophageal dilatation, also called esophageal dilation, is a procedure to widen or open a blocked or narrowed part of the esophagus. Plan to have a responsible adult take you home from the hospital or clinic. For this procedure, a numbing medicine may be sprayed into the back of your throat, or you may gargle the medicine. Do not drive or operate machinery until your health care provider says that it is safe. This information is not intended to replace advice given to you by your health care provider. Make sure you discuss any questions you have with your health care provider. Document Revised: 10/29/2019 Document Reviewed: 10/29/2019 Elsevier Patient Education  2023 [employer].

## 2021-12-29 ENCOUNTER — Inpatient Hospital Stay (HOSPITAL_COMMUNITY)
Admission: RE | Admit: 2021-12-29 | Discharge: 2021-12-29 | Disposition: A | Discharge status: Home / Self Care | Payer: Medicaid Other | Source: Ambulatory Visit

## 2021-12-29 ENCOUNTER — Telehealth: Payer: Self-pay | Admitting: *Deleted

## 2021-12-29 ENCOUNTER — Telehealth: Payer: Self-pay | Admitting: Internal Medicine

## 2021-12-29 NOTE — Telephone Encounter (Signed)
-----   Message from Nobie Putnam sent at 12/29/2021  8:03 AM EDT ----- This patient called and states his foot is swollen and he can't walk so he can't come to his PAT.  His TCS is Monday.  I advised him to call your office to reschedule his procedure.  I have canceled the PAT and pulled him into the depot.  Thanks,

## 2021-12-29 NOTE — Telephone Encounter (Signed)
Pt needs to reschedule his procedure. He is scheduled with Dr Marletta Lor on 01/02/2022 [phone]

## 2021-12-29 NOTE — Telephone Encounter (Signed)
See prior note

## 2021-12-29 NOTE — Telephone Encounter (Signed)
LMOVM to call back 

## 2022-01-02 ENCOUNTER — Encounter (HOSPITAL_COMMUNITY): Admission: RE | Payer: Self-pay | Source: Home / Self Care

## 2022-01-02 ENCOUNTER — Encounter: Payer: Self-pay | Admitting: *Deleted

## 2022-01-02 ENCOUNTER — Ambulatory Visit (HOSPITAL_COMMUNITY): Admission: RE | Admit: 2022-01-02 | Payer: Medicaid Other | Source: Home / Self Care

## 2022-01-02 SURGERY — COLONOSCOPY WITH PROPOFOL
Anesthesia: Monitor Anesthesia Care

## 2022-01-02 NOTE — Telephone Encounter (Signed)
Letter mailed

## 2022-03-19 NOTE — Progress Notes (Deleted)
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  Will adjust antibiotics if we obtain positive sputum culture. - DuoNeb QID -Solu-Medrol 60 mg BID - Expiratory spirometry - Flutter valve   Depression -Not on home meds   Nicotine abuse - Nicotine patch   Drug abuse - 9/2 urine rapid drug screen positive cocaine and marijuana -9/3 Counseling of resources available to stop use of marijuana and cocaine   Goals of care - 9/4 consult to LCSW: Patient requests to obtain a list of PCPs accepting new patients in town prior to discharge most likely in the [place] NOTE: Also would be of benefit to have list of resources to help in discontinuing use of illegal substances       History of Present Illness  08/11/2021  Pulmonary/ 1st office eval/ Gregory Camacho / Gregory Camacho Office  Chief Complaint  Patient presents with   Consult    Chronic productive cough X approx.  1 month   Dyspnea:   limited by back not breathing  Cough: more when lie down assoc with watery pnds x 20 years > mucoid more than purulent  Sleep: on side / sleeps on couch  SABA use:  not helping  Rec Pantoprazole (protonix) 40 mg   Take  30-60 min before first meal of the day and Pepcid (famotidine)  20 mg after supper until return to office  For cough > mucinex dm 1200 mg every 12 hours (over the counter)  GERD diet reviewed, bed blocks rec  Depomedrol 80mg  IM  We will schedule a CT of your chest and call the results   Please schedule a follow up office visit in 4 weeks, sooner if needed with all medications in hand       09/13/2021  f/u ov/Gregory Camacho office/Gregory Camacho re: cough x 20 y esp hs  maint on ?  (did not bring meds as req but reports "always better on prednisone)  Chief Complaint  Patient presents with   Follow-up    Patient feels better overall but is still coughing up mucus.   Dyspnea:  back is better and says not limited by breathing at this point  Cough: p supper and hs / mucoid but better on pred Sleeping: 3 pillows /no bed blocks / says wakes up around 3am coughing each noct SABA use: avg every 4 -5 h 02: none  Covid status: vax x 2  Rec Pantoprazole (protonix) 40 mg   Take  30-60 min before first meal of the day and Pepcid (famotidine)  20 mg after supper until return to office  For cough > mucinex dm 1200 mg every 12 hours (over the counter)  GERD diet reviewed, bed blocks rec  Prednisone 10 mg take  4 each am x 2 days,   2 each am x 2 days,  1 each am x 2 days and stop  Start symbicort 80 Take 2 puffs first thing in am and then another 2 puffs about 12 hours later.   Only use your albuterol as  a rescue medication to be used if you can't catch your breath  Pfts next available Please schedule a follow up office visit in 6 weeks, call sooner if needed with all medications /inhalers/ solutions in hand so we can verify exactly what you are taking. This includes all medications from all doctors and over the counters    11/01/2021  f/u ov/Gregory Camacho office/Gregory Camacho re: cough x 2000  maint on saba prn   Chief Complaint  Patient presents with   Follow-up    Still coughing up light colored mucus.   Needs refill on rescue inhaler    Dyspnea:  as long not coughing his breathing is fine  Cough: worse in am tends to wake up prematurely - worse since quit smoking  Tends to be dark was green now yellow  Sleeping:  flat on couch with 2-3 pillows under head  SABA use: does seem to help using ever 4 hourshas not picked up symb 80 yet 02: not using 02  Lung cancer screening: not due until  09/24/22   Rec Plan A = Automatic = Always=    Symbicort 80 Take 2 puffs first thing in am and then another 2 puffs about 12 hours later.  Work on inhaler technique:   Plan B = Backup (to supplement plan A, not to replace it) Only use your albuterol inhaler as a rescue medication   12/15/2021  f/u ov/Gregory Camacho office/Gregory Camacho re: cough x 2000  maint on no rx   Chief Complaint  Patient presents with   Follow-up    Cough is on and off getting better. Still coughing up green/light colored sputum  Dyspnea:  says not limited by breathing  Cough: 3 x daily / worse in ams /usually mucoid but freq esp in am more green assoc with nasal congestion and L max pain SABA use: ventolin 4-5 x daily  02: none  Covid status: vax x 2  Rec Plan A = Automatic = Always=    symbicort 80 Take 2 puffs first thing in am and then another 2 puffs about 12 hours later.  Work on inhaler technique:  Plan B = Backup (to supplement plan A, not to replace it) Only use your albuterol inhaler as a rescue medication We will call to schedule you a sinus CT  - was already ordered NOT DONE as of  03/20/2022   Please schedule a follow up office visit in  3 months call sooner if needed with all your inhalers    03/20/2022  f/u ov/Gregory Camacho office/Gregory Camacho re: *** maint on ***   No chief complaint on file.   Dyspnea:  *** Cough: *** Sleeping: *** SABA use: *** 02: *** Covid status: *** Lung cancer screening: ***   No obvious day to day or daytime variability or assoc excess/ purulent sputum or mucus plugs or hemoptysis or cp or chest tightness, subjective wheeze or overt sinus or hb symptoms.   *** without nocturnal  or early am exacerbation  of respiratory  c/o's or need for noct saba. Also denies any obvious fluctuation of symptoms with weather or environmental changes or other aggravating or alleviating factors except as outlined above   No unusual exposure hx or h/o childhood pna/ asthma or knowledge of premature birth.  Current  Allergies, Complete Past Medical History, Past Surgical History, Family History, and Social History were reviewed in [employer] record.  ROS  The following are not active complaints unless bolded Hoarseness, sore throat, dysphagia, dental problems, itching,  sneezing,  nasal congestion or discharge of excess mucus or purulent secretions, ear ache,   fever, chills, sweats, unintended wt loss or wt gain, classically pleuritic or exertional cp,  orthopnea pnd or arm/hand swelling  or leg swelling, presyncope, palpitations, abdominal pain, anorexia, nausea, vomiting, diarrhea  or change in bowel habits or change in bladder habits, change in stools or change in urine, dysuria, hematuria,  rash, arthralgias, visual complaints, headache, numbness, weakness or ataxia or problems with walking or coordination,  change in mood or  memory.        No outpatient medications have been marked as taking for the 03/20/22 encounter (Appointment) with Tanda Rockers, MD.                   Objective:    wts  03/20/2022        ***  12/15/2021       114  11/01/2021         115  09/13/2021       118   08/11/21 121 lb (54.9 kg)  08/08/21 141 lb 1.5 oz (64 kg)  03/03/21 140 lb (63.5 kg)       Vital signs reviewed  03/20/2022  - Note at rest 02 sats  ***% on ***   General appearance:    ***      poor dentition  Nasal turbinates mod / severe TE*** Min bar***                      Assessment

## 2022-03-20 ENCOUNTER — Ambulatory Visit: Payer: Medicaid Other | Admitting: Internal Medicine

## 2022-04-13 ENCOUNTER — Inpatient Hospital Stay: Payer: Medicaid Other | Attending: Hematology

## 2022-04-20 ENCOUNTER — Telehealth: Payer: Medicaid Other | Admitting: Physician Assistant

## 2023-06-28 IMAGING — CT CT CHEST HIGH RESOLUTION
3 of 16 series · 8 of 36 positions shown, 9 images · non-contrast
Comparison: None.

CLINICAL DATA: Bronchiectasis, chronic cough, increased shortness
of breath, former smoker



[Series 5: high res thins · axial · 0.61mm/px · z∈[+1381,+1542]mm · 4 of 335 slices shown, 5 images]
[im 67/335  mediastinal]
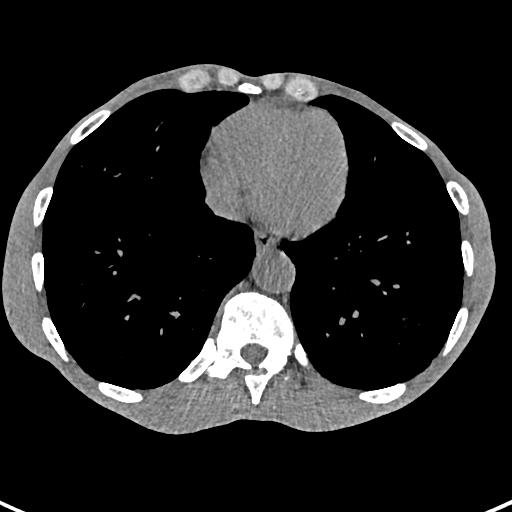
[im 67/335  lung]
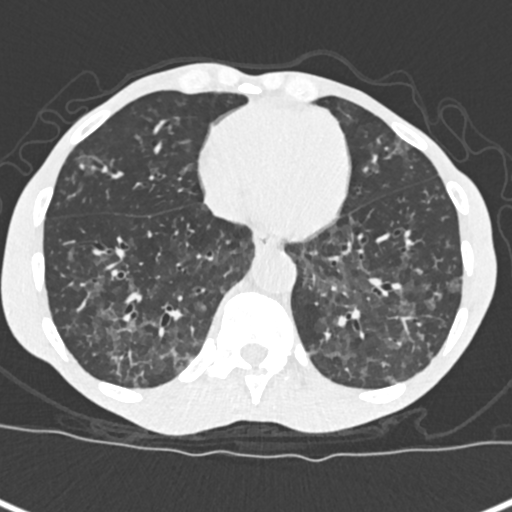
[im 134/335  lung]
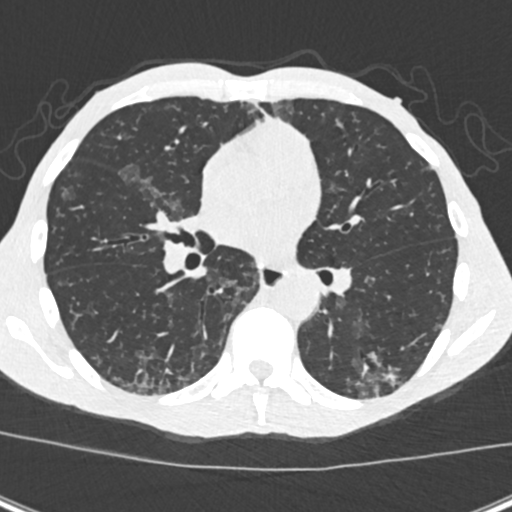
[im 201/335  lung]
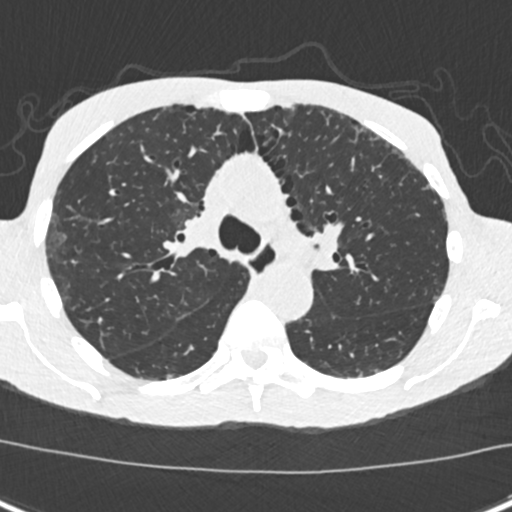
[im 268/335  lung]
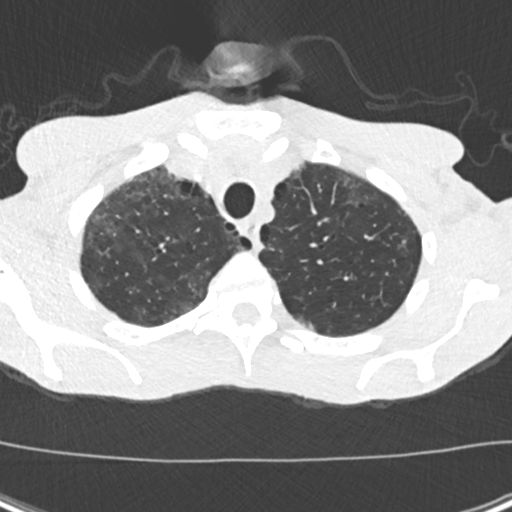

[Series 7: high resolution retro · axial · 0.61mm/px · z∈[+1395,+1529]mm · 3 of 268 slices shown]
[im 67/268  lung]
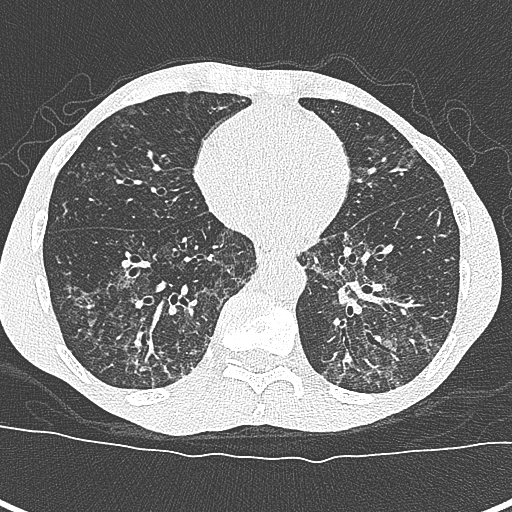
[im 134/268  lung]
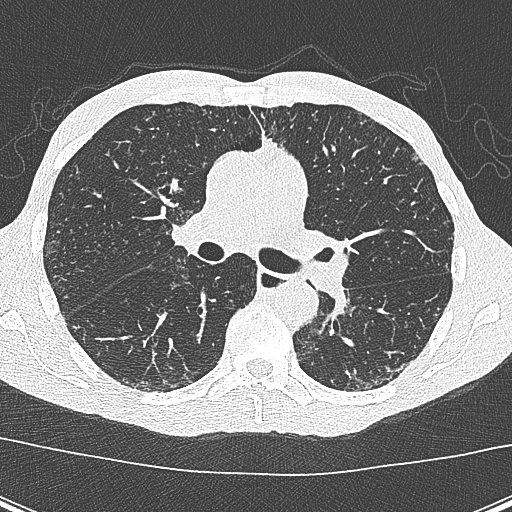
[im 201/268  lung]
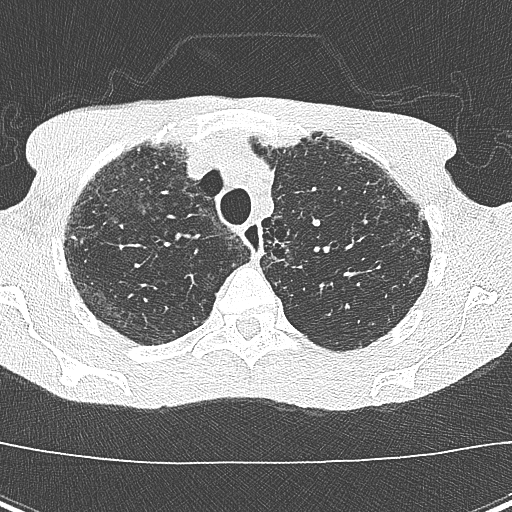

[Series 8: coronal · coronal · 0.52mm/px · 1 of 131 slices shown]
[im 66/131  lung]
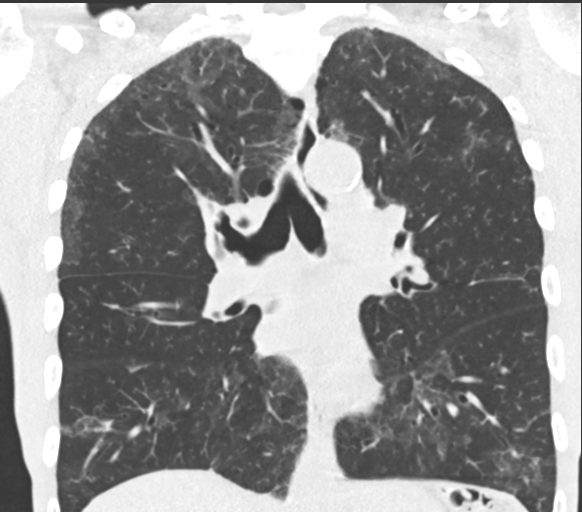

[8 of 36 positions shown; findings below may reference images not displayed]

FINDINGS: Cardiovascular: Aortic atherosclerosis. Normal heart size. No
pericardial effusion.

Mediastinum/Nodes: No enlarged mediastinal, hilar, or axillary lymph
nodes. Thyroid gland, trachea, and esophagus demonstrate no
significant findings.

Lungs/Pleura: Mild centrilobular and paraseptal emphysema. Diffuse
bilateral bronchial wall thickening. Very extensive, predominantly
peribronchovascular irregular interstitial opacity, ground-glass,
and fine centrilobular nodules, somewhat more conspicuous in the
lung bases although without a clear gradient. No significant air
trapping on expiratory phase imaging. No pleural effusion or
pneumothorax.

Upper Abdomen: No acute abnormality.

Musculoskeletal: No chest wall abnormality. No suspicious osseous
lesions identified.
IMPRESSION: 1. Very extensive, predominantly peribronchovascular irregular
interstitial opacity, ground-glass, and fine centrilobular nodules,
somewhat more conspicuous in the lung bases although without a clear
gradient. Findings are highly suggestive of smoking-related
desquamative interstitial pneumonitis.
2. Mild emphysema and diffuse bilateral bronchial wall thickening.

Aortic Atherosclerosis (86387-D0I.I) and Emphysema (86387-33C.S).

## 2024-03-06 ENCOUNTER — Emergency Department (HOSPITAL_COMMUNITY)

## 2024-03-06 ENCOUNTER — Emergency Department (HOSPITAL_COMMUNITY)
Admission: EM | Admit: 2024-03-06 | Discharge: 2024-03-06 | Disposition: A | Discharge status: Home / Self Care | Attending: Emergency Medicine | Admitting: Emergency Medicine

## 2024-03-06 ENCOUNTER — Encounter (HOSPITAL_COMMUNITY): Payer: Self-pay

## 2024-03-06 ENCOUNTER — Other Ambulatory Visit: Payer: Self-pay

## 2024-03-06 DIAGNOSIS — R109 Unspecified abdominal pain: Secondary | ICD-10-CM | POA: Diagnosis not present

## 2024-03-06 DIAGNOSIS — N390 Urinary tract infection, site not specified: Secondary | ICD-10-CM | POA: Diagnosis not present

## 2024-03-06 DIAGNOSIS — R911 Solitary pulmonary nodule: Secondary | ICD-10-CM | POA: Diagnosis not present

## 2024-03-06 DIAGNOSIS — M545 Low back pain, unspecified: Secondary | ICD-10-CM | POA: Diagnosis present

## 2024-03-06 LAB — CBC WITH DIFFERENTIAL/PLATELET
Abs Immature Granulocytes: 0.16 K/uL — ABNORMAL HIGH (ref 0.00–0.07)
Basophils Absolute: 0.1 K/uL (ref 0.0–0.1)
Basophils Relative: 1 %
Eosinophils Absolute: 0.1 K/uL (ref 0.0–0.5)
Eosinophils Relative: 1 %
HCT: 41.8 % (ref 39.0–52.0)
Hemoglobin: 13 g/dL (ref 13.0–17.0)
Immature Granulocytes: 1 %
Lymphocytes Relative: 19 %
Lymphs Abs: 2.8 K/uL (ref 0.7–4.0)
MCH: 26.9 pg (ref 26.0–34.0)
MCHC: 31.1 g/dL (ref 30.0–36.0)
MCV: 86.5 fL (ref 80.0–100.0)
Monocytes Absolute: 2 K/uL — ABNORMAL HIGH (ref 0.1–1.0)
Monocytes Relative: 14 %
Neutro Abs: 9.6 K/uL — ABNORMAL HIGH (ref 1.7–7.7)
Neutrophils Relative %: 64 %
Platelets: 387 K/uL (ref 150–400)
RBC: 4.83 MIL/uL (ref 4.22–5.81)
RDW: 14 % (ref 11.5–15.5)
WBC: 14.8 K/uL — ABNORMAL HIGH (ref 4.0–10.5)
nRBC: 0 % (ref 0.0–0.2)

## 2024-03-06 LAB — URINALYSIS, ROUTINE W REFLEX MICROSCOPIC
Bacteria, UA: NONE SEEN
Bilirubin Urine: NEGATIVE
Glucose, UA: NEGATIVE mg/dL
Hgb urine dipstick: NEGATIVE
Ketones, ur: NEGATIVE mg/dL
Nitrite: NEGATIVE
Protein, ur: 100 mg/dL — AB
Specific Gravity, Urine: 1.024 (ref 1.005–1.030)
WBC, UA: >50 WBC/hpf (ref 0–5)
pH: 7 (ref 5.0–8.0)

## 2024-03-06 LAB — BASIC METABOLIC PANEL WITH GFR
Anion gap: 12 (ref 5–15)
BUN: 17 mg/dL (ref 8–23)
CO2: 26 mmol/L (ref 22–32)
Calcium: 9.1 mg/dL (ref 8.9–10.3)
Chloride: 99 mmol/L (ref 98–111)
Creatinine, Ser: 1.12 mg/dL (ref 0.61–1.24)
GFR, Estimated: >60 mL/min (ref >60)
Glucose, Bld: 150 mg/dL — ABNORMAL HIGH (ref 70–99)
Potassium: 4.2 mmol/L (ref 3.5–5.1)
Sodium: 137 mmol/L (ref 135–145)

## 2024-03-06 MED ORDER — HYDROCODONE-ACETAMINOPHEN 5-325 MG PO TABS: 1.0000 | ORAL_TABLET | Freq: Four times a day (QID) | ORAL | 0 refills | Status: DC | PRN

## 2024-03-06 MED ORDER — HYDROCODONE-ACETAMINOPHEN 5-325 MG PO TABS
1.0000 | ORAL_TABLET | Freq: Once | ORAL | Status: AC
  Administered 2024-03-06: 1 via ORAL
  Filled 2024-03-06: qty 1

## 2024-03-06 MED ORDER — SODIUM CHLORIDE 0.9 % IV SOLN
1.0000 g | Freq: Once | INTRAVENOUS | Status: AC
  Administered 2024-03-06: 1 g via INTRAVENOUS
  Filled 2024-03-06: qty 10

## 2024-03-06 MED ORDER — CEPHALEXIN 500 MG PO CAPS: 500.0000 mg | ORAL_CAPSULE | Freq: Four times a day (QID) | ORAL | 0 refills | Status: DC

## 2024-03-06 MED ORDER — LIDOCAINE 5 % EX PTCH: 1.0000 | MEDICATED_PATCH | CUTANEOUS | 0 refills | Status: AC
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
## 2024-03-06 NOTE — ED Notes (Signed)
 Pt aware we need a urine sample, urinal in reach.

## 2024-03-08 LAB — URINE CULTURE: Culture: >=100000 — AB

## 2024-03-09 ENCOUNTER — Telehealth (HOSPITAL_BASED_OUTPATIENT_CLINIC_OR_DEPARTMENT_OTHER): Payer: Self-pay | Admitting: *Deleted

## 2024-03-09 NOTE — Telephone Encounter (Signed)
 Post ED Visit - Positive Culture Follow-up  Culture report reviewed by antimicrobial stewardship pharmacist: [facility] []  Rankin Dee, Pharm.D. []  Venetia Gully, Pharm.D., BCPS AQ-ID []  Garrel Crews, Pharm.D., BCPS []  Almarie Lunger, Pharm.D., BCPS []  [place], [address].D., BCPS, AAHIVP []  Rosaline Bihari, Pharm.D., BCPS, AAHIVP []  Vernell Meier, PharmD, BCPS []  Latanya Hint, PharmD, BCPS []  Donald Medley, PharmD, BCPS []  Rocky Bold, PharmD []  Dorothyann Alert, PharmD, BCPS [x]  Dorn Poot,  PharmD  [facility] []  Rosaline Edison, PharmD []  Romona Bliss, PharmD []  Dolphus Roller, PharmD []  Veva Seip, Rph []  Vernell Daunt) Leonce, PharmD []  Eva Allis, PharmD []  Rosaline Millet, PharmD []  Iantha Batch, PharmD []  Arvin Gauss, PharmD []  Wanda Hasting, PharmD []  Ronal Rav, PharmD []  Rocky Slade, PharmD []  Bard Jeans, PharmD   Positive urine culture Treated with Cephalexin , organism sensitive to the same and no further patient follow-up is required at this time.  Albino Alan Novak 03/09/2024, 10:47 AM

## 2024-03-24 ENCOUNTER — Encounter (HOSPITAL_COMMUNITY): Payer: Self-pay

## 2024-03-24 ENCOUNTER — Emergency Department (HOSPITAL_COMMUNITY)

## 2024-03-24 ENCOUNTER — Inpatient Hospital Stay (HOSPITAL_COMMUNITY)
Admission: EM | Admit: 2024-03-24 | Discharge: 2024-03-31 | DRG: 638 | Disposition: A | Discharge status: Home / Self Care | Attending: Internal Medicine | Admitting: Internal Medicine

## 2024-03-24 ENCOUNTER — Other Ambulatory Visit: Payer: Self-pay

## 2024-03-24 ENCOUNTER — Inpatient Hospital Stay (HOSPITAL_COMMUNITY)

## 2024-03-24 ENCOUNTER — Ambulatory Visit: Payer: Self-pay

## 2024-03-24 DIAGNOSIS — Z8719 Personal history of other diseases of the digestive system: Secondary | ICD-10-CM | POA: Diagnosis not present

## 2024-03-24 DIAGNOSIS — F1410 Cocaine abuse, uncomplicated: Secondary | ICD-10-CM | POA: Diagnosis present

## 2024-03-24 DIAGNOSIS — G061 Intraspinal abscess and granuloma: Secondary | ICD-10-CM | POA: Diagnosis present

## 2024-03-24 DIAGNOSIS — R911 Solitary pulmonary nodule: Secondary | ICD-10-CM | POA: Diagnosis present

## 2024-03-24 DIAGNOSIS — Z681 Body mass index (BMI) 19 or less, adult: Secondary | ICD-10-CM

## 2024-03-24 DIAGNOSIS — F1490 Cocaine use, unspecified, uncomplicated: Secondary | ICD-10-CM | POA: Diagnosis not present

## 2024-03-24 DIAGNOSIS — D649 Anemia, unspecified: Secondary | ICD-10-CM | POA: Diagnosis present

## 2024-03-24 DIAGNOSIS — Z7984 Long term (current) use of oral hypoglycemic drugs: Secondary | ICD-10-CM | POA: Diagnosis not present

## 2024-03-24 DIAGNOSIS — R7881 Bacteremia: Secondary | ICD-10-CM | POA: Diagnosis present

## 2024-03-24 DIAGNOSIS — K219 Gastro-esophageal reflux disease without esophagitis: Secondary | ICD-10-CM | POA: Diagnosis present

## 2024-03-24 DIAGNOSIS — Z886 Allergy status to analgesic agent status: Secondary | ICD-10-CM

## 2024-03-24 DIAGNOSIS — Z87891 Personal history of nicotine dependence: Secondary | ICD-10-CM | POA: Diagnosis not present

## 2024-03-24 DIAGNOSIS — Z79899 Other long term (current) drug therapy: Secondary | ICD-10-CM

## 2024-03-24 DIAGNOSIS — B9561 Methicillin susceptible Staphylococcus aureus infection as the cause of diseases classified elsewhere: Secondary | ICD-10-CM | POA: Diagnosis present

## 2024-03-24 DIAGNOSIS — J449 Chronic obstructive pulmonary disease, unspecified: Secondary | ICD-10-CM | POA: Diagnosis present

## 2024-03-24 DIAGNOSIS — M4626 Osteomyelitis of vertebra, lumbar region: Secondary | ICD-10-CM | POA: Diagnosis present

## 2024-03-24 DIAGNOSIS — E119 Type 2 diabetes mellitus without complications: Secondary | ICD-10-CM

## 2024-03-24 DIAGNOSIS — Z8249 Family history of ischemic heart disease and other diseases of the circulatory system: Secondary | ICD-10-CM | POA: Diagnosis not present

## 2024-03-24 DIAGNOSIS — F1210 Cannabis abuse, uncomplicated: Secondary | ICD-10-CM | POA: Diagnosis present

## 2024-03-24 DIAGNOSIS — Z1152 Encounter for screening for COVID-19: Secondary | ICD-10-CM

## 2024-03-24 DIAGNOSIS — Z23 Encounter for immunization: Secondary | ICD-10-CM | POA: Diagnosis present

## 2024-03-24 DIAGNOSIS — Z716 Tobacco abuse counseling: Secondary | ICD-10-CM | POA: Diagnosis not present

## 2024-03-24 DIAGNOSIS — M4620 Osteomyelitis of vertebra, site unspecified: Principal | ICD-10-CM

## 2024-03-24 DIAGNOSIS — N3000 Acute cystitis without hematuria: Secondary | ICD-10-CM

## 2024-03-24 DIAGNOSIS — A419 Sepsis, unspecified organism: Secondary | ICD-10-CM

## 2024-03-24 DIAGNOSIS — M4646 Discitis, unspecified, lumbar region: Secondary | ICD-10-CM | POA: Diagnosis present

## 2024-03-24 DIAGNOSIS — Z7951 Long term (current) use of inhaled steroids: Secondary | ICD-10-CM | POA: Diagnosis not present

## 2024-03-24 DIAGNOSIS — E441 Mild protein-calorie malnutrition: Secondary | ICD-10-CM | POA: Diagnosis present

## 2024-03-24 DIAGNOSIS — R627 Adult failure to thrive: Secondary | ICD-10-CM | POA: Diagnosis present

## 2024-03-24 DIAGNOSIS — E1169 Type 2 diabetes mellitus with other specified complication: Principal | ICD-10-CM | POA: Diagnosis present

## 2024-03-24 DIAGNOSIS — I38 Endocarditis, valve unspecified: Secondary | ICD-10-CM | POA: Diagnosis not present

## 2024-03-24 LAB — COMPREHENSIVE METABOLIC PANEL WITH GFR
ALT: 16 U/L (ref 0–44)
AST: 24 U/L (ref 15–41)
Albumin: 3.2 g/dL — ABNORMAL LOW (ref 3.5–5.0)
Alkaline Phosphatase: 72 U/L (ref 38–126)
Anion gap: 15 (ref 5–15)
BUN: 23 mg/dL (ref 8–23)
CO2: 24 mmol/L (ref 22–32)
Calcium: 8.9 mg/dL (ref 8.9–10.3)
Chloride: 96 mmol/L — ABNORMAL LOW (ref 98–111)
Creatinine, Ser: 0.99 mg/dL (ref 0.61–1.24)
GFR, Estimated: >60 mL/min (ref >60)
Glucose, Bld: 101 mg/dL — ABNORMAL HIGH (ref 70–99)
Potassium: 4.7 mmol/L (ref 3.5–5.1)
Sodium: 135 mmol/L (ref 135–145)
Total Bilirubin: 0.6 mg/dL (ref 0.0–1.2)
Total Protein: 9.3 g/dL — ABNORMAL HIGH (ref 6.5–8.1)

## 2024-03-24 LAB — LACTIC ACID, PLASMA: Lactic Acid, Venous: 1 mmol/L (ref 0.5–1.9)

## 2024-03-24 LAB — CBC WITH DIFFERENTIAL/PLATELET
Abs Immature Granulocytes: 0.04 K/uL (ref 0.00–0.07)
Basophils Absolute: 0.1 K/uL (ref 0.0–0.1)
Basophils Relative: 1 %
Eosinophils Absolute: 0.1 K/uL (ref 0.0–0.5)
Eosinophils Relative: 1 %
HCT: 36.3 % — ABNORMAL LOW (ref 39.0–52.0)
Hemoglobin: 11 g/dL — ABNORMAL LOW (ref 13.0–17.0)
Immature Granulocytes: 0 %
Lymphocytes Relative: 15 %
Lymphs Abs: 1.9 K/uL (ref 0.7–4.0)
MCH: 26.1 pg (ref 26.0–34.0)
MCHC: 30.3 g/dL (ref 30.0–36.0)
MCV: 86 fL (ref 80.0–100.0)
Monocytes Absolute: 1.7 K/uL — ABNORMAL HIGH (ref 0.1–1.0)
Monocytes Relative: 14 %
Neutro Abs: 8.6 K/uL — ABNORMAL HIGH (ref 1.7–7.7)
Neutrophils Relative %: 69 %
Platelets: 479 K/uL — ABNORMAL HIGH (ref 150–400)
RBC: 4.22 MIL/uL (ref 4.22–5.81)
RDW: 14.1 % (ref 11.5–15.5)
WBC: 12.3 K/uL — ABNORMAL HIGH (ref 4.0–10.5)
nRBC: 0 % (ref 0.0–0.2)

## 2024-03-24 LAB — RESP PANEL BY RT-PCR (RSV, FLU A&B, COVID)  RVPGX2
Influenza A by PCR: NEGATIVE
Influenza B by PCR: NEGATIVE
Resp Syncytial Virus by PCR: NEGATIVE
SARS Coronavirus 2 by RT PCR: NEGATIVE

## 2024-03-24 LAB — SEDIMENTATION RATE: Sed Rate: >140 mm/h — ABNORMAL HIGH (ref 0–20)

## 2024-03-24 MED ORDER — LACTATED RINGERS IV SOLN: INTRAVENOUS | Status: AC

## 2024-03-24 MED ORDER — SODIUM CHLORIDE 0.9% FLUSH
3.0000 mL | Freq: Two times a day (BID) | INTRAVENOUS | Status: DC
  Administered 2024-03-25 – 2024-03-31 (×12): 3 mL via INTRAVENOUS

## 2024-03-24 MED ORDER — VANCOMYCIN HCL 500 MG/100ML IV SOLN
500.0000 mg | Freq: Two times a day (BID) | INTRAVENOUS | Status: DC
  Administered 2024-03-25: 500 mg via INTRAVENOUS
  Filled 2024-03-24 (×4): qty 100

## 2024-03-24 MED ORDER — FOLIC ACID 1 MG PO TABS
1.0000 mg | ORAL_TABLET | Freq: Every day | ORAL | Status: DC
  Administered 2024-03-25 – 2024-03-31 (×5): 1 mg via ORAL
  Filled 2024-03-24 (×6): qty 1

## 2024-03-24 MED ORDER — ADULT MULTIVITAMIN W/MINERALS CH
1.0000 | ORAL_TABLET | Freq: Every day | ORAL | Status: DC
  Administered 2024-03-25 – 2024-03-31 (×5): 1 via ORAL
  Filled 2024-03-24 (×6): qty 1

## 2024-03-24 MED ORDER — ONDANSETRON HCL 4 MG/2ML IJ SOLN
4.0000 mg | Freq: Once | INTRAMUSCULAR | Status: AC
  Administered 2024-03-24: 4 mg via INTRAVENOUS
  Filled 2024-03-24: qty 2

## 2024-03-24 MED ORDER — OXYCODONE HCL 5 MG PO TABS
2.5000 mg | ORAL_TABLET | Freq: Four times a day (QID) | ORAL | Status: DC | PRN
Start: 2024-03-24 — End: 2024-03-31

## 2024-03-24 MED ORDER — ACETAMINOPHEN 500 MG PO TABS
1000.0000 mg | ORAL_TABLET | Freq: Once | ORAL | Status: AC
  Administered 2024-03-24: 1000 mg via ORAL
  Filled 2024-03-24: qty 2

## 2024-03-24 MED ORDER — VANCOMYCIN HCL IN DEXTROSE 1-5 GM/200ML-% IV SOLN
1000.0000 mg | Freq: Once | INTRAVENOUS | Status: AC
  Administered 2024-03-24: 1000 mg via INTRAVENOUS
  Filled 2024-03-24: qty 200

## 2024-03-24 MED ORDER — HYDROMORPHONE HCL 1 MG/ML IJ SOLN
0.5000 mg | Freq: Once | INTRAMUSCULAR | Status: AC
  Administered 2024-03-24: 0.5 mg via INTRAVENOUS
  Filled 2024-03-24: qty 0.5

## 2024-03-24 MED ORDER — ACETAMINOPHEN 500 MG PO TABS
1000.0000 mg | ORAL_TABLET | Freq: Four times a day (QID) | ORAL | Status: DC | PRN
  Administered 2024-03-26 – 2024-03-31 (×3): 1000 mg via ORAL
  Filled 2024-03-24 (×4): qty 2

## 2024-03-24 MED ORDER — THIAMINE HCL 100 MG/ML IJ SOLN
100.0000 mg | Freq: Every day | INTRAMUSCULAR | Status: DC
  Administered 2024-03-28: 100 mg via INTRAVENOUS
  Filled 2024-03-24: qty 2

## 2024-03-24 MED ORDER — SODIUM CHLORIDE 0.9 % IV BOLUS (SEPSIS)
1000.0000 mL | Freq: Once | INTRAVENOUS | Status: AC
  Administered 2024-03-24: 1000 mL via INTRAVENOUS

## 2024-03-24 MED ORDER — GADOBUTROL 1 MMOL/ML IV SOLN
5.0000 mL | Freq: Once | INTRAVENOUS | Status: AC | PRN
Start: 2024-03-24 — End: 2024-03-24
  Administered 2024-03-24: 5 mL via INTRAVENOUS

## 2024-03-24 MED ORDER — ENOXAPARIN SODIUM 40 MG/0.4ML IJ SOSY
40.0000 mg | PREFILLED_SYRINGE | Freq: Every day | INTRAMUSCULAR | Status: DC
  Administered 2024-03-24 – 2024-03-25 (×2): 40 mg via SUBCUTANEOUS
  Filled 2024-03-24 (×2): qty 0.4

## 2024-03-24 MED ORDER — METHOCARBAMOL 500 MG PO TABS
500.0000 mg | ORAL_TABLET | Freq: Three times a day (TID) | ORAL | Status: DC | PRN
  Administered 2024-03-24 – 2024-03-31 (×7): 500 mg via ORAL
  Filled 2024-03-24 (×7): qty 1

## 2024-03-24 MED ORDER — ALBUTEROL SULFATE (2.5 MG/3ML) 0.083% IN NEBU: 2.5000 mg | INHALATION_SOLUTION | RESPIRATORY_TRACT | Status: DC | PRN

## 2024-03-24 MED ORDER — THIAMINE MONONITRATE 100 MG PO TABS
100.0000 mg | ORAL_TABLET | Freq: Every day | ORAL | Status: DC
  Administered 2024-03-25 – 2024-03-31 (×5): 100 mg via ORAL
  Filled 2024-03-24 (×6): qty 1

## 2024-03-24 MED ORDER — SODIUM CHLORIDE 0.9 % IV SOLN
2.0000 g | Freq: Once | INTRAVENOUS | Status: AC
  Administered 2024-03-24: 2 g via INTRAVENOUS
  Filled 2024-03-24: qty 12.5

## 2024-03-24 MED ORDER — ONDANSETRON HCL 4 MG/2ML IJ SOLN: 4.0000 mg | Freq: Four times a day (QID) | INTRAMUSCULAR | Status: DC | PRN

## 2024-03-24 MED ORDER — OXYCODONE HCL 5 MG PO TABS
5.0000 mg | ORAL_TABLET | Freq: Four times a day (QID) | ORAL | Status: DC | PRN
  Administered 2024-03-24 – 2024-03-31 (×18): 5 mg via ORAL
  Filled 2024-03-24 (×18): qty 1

## 2024-03-24 MED ORDER — KETOROLAC TROMETHAMINE 15 MG/ML IJ SOLN
15.0000 mg | Freq: Once | INTRAMUSCULAR | Status: AC
  Administered 2024-03-24: 15 mg via INTRAVENOUS
  Filled 2024-03-24: qty 1

## 2024-03-24 MED ORDER — MELATONIN 3 MG PO TABS
6.0000 mg | ORAL_TABLET | Freq: Every evening | ORAL | Status: DC | PRN
  Administered 2024-03-24 – 2024-03-30 (×2): 6 mg via ORAL
  Filled 2024-03-24 (×2): qty 2

## 2024-03-24 MED ORDER — POLYETHYLENE GLYCOL 3350 17 G PO PACK: 17.0000 g | PACK | Freq: Every day | ORAL | Status: DC | PRN

## 2024-03-24 MED ORDER — SODIUM CHLORIDE 0.9 % IV SOLN
2.0000 g | Freq: Two times a day (BID) | INTRAVENOUS | Status: DC
  Administered 2024-03-25: 2 g via INTRAVENOUS
  Filled 2024-03-24: qty 12.5

## 2024-03-24 MED ORDER — LACTATED RINGERS IV SOLN: INTRAVENOUS | Status: DC

## 2024-03-24 NOTE — Progress Notes (Addendum)
 CODE SEPSIS - PHARMACY COMMUNICATION  **Broad Spectrum Antibiotics should be administered within 1 hour of Sepsis diagnosis**  Time Code Sepsis Called/Page Received: 1743  Antibiotics Ordered: cefepime, vancomycin  Time of 1st antibiotic administration:  1924  Additional action taken by pharmacy: called RN to check on.  Per RN: Sepsis care delayed due to MRI not allowing pt to go with any fluids running  (see note)  If necessary, Name of Provider/Nurse Contacted: Rosina Darreld Suzann Allean DELENA ,PharmD Clinical Pharmacist  03/24/2024  6:41 PM

## 2024-03-24 NOTE — H&P (Signed)
 History and Physical    Gregory Camacho DOB: August 06, 1960 DOA: 03/24/2024  PCP: Cook, Jayce G, DO   Patient coming from: Home   Chief Complaint:  Chief Complaint  Patient presents with   Back Pain    HPI:  Gregory Camacho is a 63 y.o. male with hx of COPD, DM type 2, substance use disorder (cocaine, marijuana, tobacco), hx of persistent leukocytosis / thrombocytosis, suspected reactive, IDA, mood d/o, who presented with worsening back pain. Unclear when began, but has worsened since around time of last ED visit on 9/11. Denies fall or other injury. Notes that sometimes he feels weak in his legs, but denies any active numbness / weakness. Otherwise recently has had dysuria. He has no indwelling lines. No other back pain, joint pain. Does c/o headache. No vision changes, speech changes, numbness, weakness,. No wounds, although reports having recent ulceration in his nose. Denies any active or recent IVDU. Acknowledges smoking crack to try and help with pain. Rare alcohol use per his report.    Review of Systems:  ROS complete and negative except as marked above   Allergies  Allergen Reactions   Bc Fast Pain Relief [Aspirin -Salicylamide-Caffeine] Other (See Comments)    Upset stomach    Prior to Admission medications   Medication Sig Start Date End Date Taking? Authorizing Provider  albuterol  (VENTOLIN  HFA) 108 (90 Base) MCG/ACT inhaler Inhale 2 puffs into the lungs every 6 (six) hours as needed for wheezing or shortness of breath. 11/04/21   Darlean Ozell NOVAK, MD  budesonide -formoterol  (SYMBICORT ) 80-4.5 MCG/ACT inhaler Take 2 puffs first thing in am and then another 2 puffs about 12 hours later. 12/15/21   Darlean Ozell NOVAK, MD  cephALEXin  (KEFLEX ) 500 MG capsule Take 1 capsule (500 mg total) by mouth 4 (four) times daily. 03/06/24   Towana Ozell BROCKS, MD  famotidine  (PEPCID ) 20 MG tablet Take 20 mg by mouth daily after supper.    [provider]   HYDROcodone -acetaminophen  (NORCO/VICODIN) 5-325 MG tablet Take 1 tablet by mouth every 6 (six) hours as needed. 03/06/24   Towana Ozell BROCKS, MD  Iron , Ferrous Sulfate , 325 (65 Fe) MG TABS Take 325 mg by mouth every other day. 09/02/21   Cook, Jayce G, DO  lidocaine  (LIDODERM ) 5 % Place 1 patch onto the skin daily. Remove & Discard patch within 12 hours or as directed by MD 03/06/24   Towana Ozell BROCKS, MD  metFORMIN  (GLUCOPHAGE ) 500 MG tablet Take 1 tablet (500 mg total) by mouth 2 (two) times daily with a meal. 09/02/21   Cook, Jayce G, DO  pantoprazole  (PROTONIX ) 40 MG tablet TAKE 1 TABLET(40 MG) BY MOUTH DAILY 30 TO 60 MINUTES BEFORE FIRST MEAL OF THE DAY 11/07/21   Darlean Ozell NOVAK, MD  traZODone  (DESYREL ) 50 MG tablet Take 1 tablet (50 mg total) by mouth at bedtime. Patient taking differently: Take 50 mg by mouth at bedtime as needed for sleep. 09/01/21   Cook, Jayce G, DO    Past Medical History:  Diagnosis Date   COPD (chronic obstructive pulmonary disease) (HCC)    Depression    Sinus congestion     Past Surgical History:  Procedure Laterality Date   CYSTOSCOPY/RETROGRADE/URETEROSCOPY Bilateral 08/15/2018   Procedure: CYSTOSCOPY/RETROGRADE URETHROGRAM  MINDI AND PERINEUM IRRIGATION AND DEBRIDEMENT;  Surgeon: Cam Morene ORN, MD;  Location: [facility];  Service: Urology;  Laterality: Bilateral;     reports that he quit smoking about 2 years ago. His smoking use  included cigarettes. He started smoking about 32 years ago. He has a 30 pack-year smoking history. He has never used smokeless tobacco. He reports current alcohol use of about 4.0 standard drinks of alcohol per week. He reports current drug use. Drugs: Marijuana and Cocaine.  Family History  Problem Relation Age of Onset   Hypertension Mother    Colon cancer Neg Hx      Physical Exam: Vitals:   03/24/24 1948 03/24/24 2000 03/24/24 2030 03/24/24 2100  BP:  (!) 108/56 (!) 102/57 (!) 101/55  Pulse: 91 84 82 79  Resp: 12 13  15  (!) 23  Temp:      TempSrc:      SpO2: 98% 96% 94% 95%  Weight:      Height:        Gen: Awake, alert, NAD   CV: Regular, normal S1, S2, no murmurs  Resp: Normal WOB, Rales in the R base  Abd: Flat, normoactive, nontender MSK: There is focal midline tenderness in over the L spine; presumably near area of OM on imaging. Otherwise C / T spine nontender. LE Symmetric, no edema  Skin: No rashes or lesions to exposed skin  Neuro: Alert and interactive  Psych: euthymic, appropriate    Data review:   Labs reviewed, notable for:    WBC 12 3, neutrophil predominant Hemoglobin 11 Platelet 479 ESR greater than 140  Recent urine culture on 9/11 with MSSA  Micro:  Results for orders placed or performed during the hospital encounter of 03/24/24  Blood culture (routine x 2)     Status: None (Preliminary result)   Collection Time: 03/24/24  6:06 PM   Specimen: Left Antecubital; Blood  Result Value Ref Range Status   Specimen Description LEFT ANTECUBITAL  Final   Special Requests   Final    AEROBIC BOTTLE ONLY Blood Culture adequate volume Performed at [facility], [address] [place], [place] [address]    Culture PENDING  Incomplete   Report Status PENDING  Incomplete  Blood culture (routine x 2)     Status: None (Preliminary result)   Collection Time: 03/24/24  6:08 PM   Specimen: Left Antecubital; Blood  Result Value Ref Range Status   Specimen Description LEFT ANTECUBITAL  Final   Special Requests   Final    BOTTLES DRAWN AEROBIC AND ANAEROBIC Blood Culture adequate volume Performed at [facility], [address], [place], [place] [address]    Culture PENDING  Incomplete   Report Status PENDING  Incomplete  Resp panel by RT-PCR (RSV, Flu A&B, Covid) Anterior Nasal Swab     Status: None   Collection Time: 03/24/24  6:28 PM   Specimen: Anterior Nasal Swab  Result Value Ref Range Status   SARS Coronavirus 2 by RT PCR NEGATIVE NEGATIVE Final    Comment:  (NOTE) SARS-CoV-2 target nucleic acids are NOT DETECTED.  The SARS-CoV-2 RNA is generally detectable in upper respiratory specimens during the acute phase of infection. The lowest concentration of SARS-CoV-2 viral copies this assay can detect is 138 copies/mL. A negative result does not preclude SARS-Cov-2 infection and should not be used as the sole basis for treatment or other patient management decisions. A negative result may occur with  improper specimen collection/handling, submission of specimen other than nasopharyngeal swab, presence of viral mutation(s) within the areas targeted by this assay, and inadequate number of viral copies(<138 copies/mL). A negative result must be combined with clinical observations, patient history, and epidemiological information. The expected  result is Negative.  Fact Sheet for Patients:  BloggerCourse.com  Fact Sheet for Healthcare Providers:  SeriousBroker.it  This test is no t yet approved or cleared by the [place]  FDA and  has been authorized for detection and/or diagnosis of SARS-CoV-2 by FDA under an Emergency Use Authorization (EUA). This EUA will remain  in effect (meaning this test can be used) for the duration of the COVID-19 declaration under Section 564(b)(1) of the Act, 21 U.S.C.section 360bbb-3(b)(1), unless the authorization is terminated  or revoked sooner.       Influenza A by PCR NEGATIVE NEGATIVE Final   Influenza B by PCR NEGATIVE NEGATIVE Final    Comment: (NOTE) The Xpert Xpress SARS-CoV-2/FLU/RSV plus assay is intended as an aid in the diagnosis of influenza from Nasopharyngeal swab specimens and should not be used as a sole basis for treatment. Nasal washings and aspirates are unacceptable for Xpert Xpress SARS-CoV-2/FLU/RSV testing.  Fact Sheet for Patients: BloggerCourse.com  Fact Sheet for Healthcare  Providers: SeriousBroker.it  This test is not yet approved or cleared by the [place]  FDA and has been authorized for detection and/or diagnosis of SARS-CoV-2 by FDA under an Emergency Use Authorization (EUA). This EUA will remain in effect (meaning this test can be used) for the duration of the COVID-19 declaration under Section 564(b)(1) of the Act, 21 U.S.C. section 360bbb-3(b)(1), unless the authorization is terminated or revoked.     Resp Syncytial Virus by PCR NEGATIVE NEGATIVE Final    Comment: (NOTE) Fact Sheet for Patients: BloggerCourse.com  Fact Sheet for Healthcare Providers: SeriousBroker.it  This test is not yet approved or cleared by the [place]  FDA and has been authorized for detection and/or diagnosis of SARS-CoV-2 by FDA under an Emergency Use Authorization (EUA). This EUA will remain in effect (meaning this test can be used) for the duration of the COVID-19 declaration under Section 564(b)(1) of the Act, 21 U.S.C. section 360bbb-3(b)(1), unless the authorization is terminated or revoked.  Performed at [facility], [address] [place], [place] [address]     Imaging reviewed:  MR Lumbar Spine W Wo Contrast Result Date: 03/24/2024 CLINICAL DATA:  Initial evaluation for acute back pain, fever. EXAM: MRI LUMBAR SPINE WITHOUT AND WITH CONTRAST TECHNIQUE: Multiplanar and multiecho pulse sequences of the lumbar spine were obtained without and with intravenous contrast. CONTRAST:  5mL GADAVIST GADOBUTROL 1 MMOL/ML IV SOLN COMPARISON:  Radiograph from 03/06/2024 FINDINGS: Segmentation: Standard. Lowest well-formed disc space labeled the L5-S1 level. Alignment: Moderate dextroscoliosis, apex at L2-3. No significant listhesis. Vertebrae: Vertebral body height maintained without acute or chronic fracture. Bone marrow signal intensity diffusely heterogeneous. No worrisome osseous lesions.  There is abnormal marrow edema involving the L1 vertebral body. Hazy inflammatory changes within the adjacent paraspinous soft tissues. Findings raise the concern for acute osteomyelitis at this level. Mild enhancement about the inferior endplate of T12, but with no other overt evidence for intervening discitis at this time. No epidural involvement. 1.6 cm area along the left anterolateral margin of the T12-L1 disc space could reflect a small abscess versus extruded disc material (series 16, image 4). No other discrete abscess or drainable fluid collection. No other evidence for acute infection elsewhere within the lumbar spine. Conus medullaris and cauda equina: Conus extends to the L1 level. Conus and cauda equina appear normal. Paraspinal and other soft tissues: Paraspinous inflammation adjacent to T12-L1 as above. Paraspinous soft tissues demonstrate no other acute finding. Disc levels: L1-2: Small chronic endplate Schmorl's node deformity. Otherwise  negative interspace. Mild facet hypertrophy. No stenosis. L2-3: Disc desiccation with minimal disc bulge. Mild facet hypertrophy. No spinal stenosis. Foramina remain patent. L3-4: Negative interspace. Mild facet hypertrophy. No significant spinal stenosis. Foramina remain patent. L4-5: Degenerate intervertebral disc space narrowing with disc desiccation diffuse disc bulge. Reactive endplate spurring. Mild facet and ligament flavum hypertrophy. No significant spinal stenosis. Moderate bilateral L4 foraminal narrowing. L5-S1: Degenerative intervertebral disc space narrowing with disc desiccation diffuse disc bulge. Reactive endplate spurring. Moderate right worse than left facet hypertrophy. No spinal stenosis. Moderate bilateral L5 foraminal narrowing. IMPRESSION: 1. Abnormal marrow edema involving the L1 vertebral body with adjacent paraspinous could reflect extruded disc material versus a small abscess. No epidural involvement. 2. No other evidence for acute  infection elsewhere within the lumbar spine. 3. Degenerative spondylosis at L4-5 and L5-S1 with resultant moderate bilateral L4 and L5 foraminal stenosis. Electronically Signed   By: Morene Hoard M.D.   On: 03/24/2024 19:29   DG Chest 2 View Result Date: 03/24/2024 CLINICAL DATA:  Back pain history of lung mass EXAM: CHEST - 2 VIEW COMPARISON:  Chest x-ray 09/08/2021, CT 03/06/2024 FINDINGS: The heart size and mediastinal contours are within normal limits. Both lungs are clear. The visualized skeletal structures are unremarkable. Sub solid right lung base density on CT is not well seen radiographically IMPRESSION: No active cardiopulmonary disease. Electronically Signed   By: Luke Bun M.D.   On: 03/24/2024 16:19    EKG: pending    ED Course:  On discitis and osteomyelitis at L1.  ED consulted with neurosurgery Dr. Gillie, recommends for antibiotic therapy, no current indication for operative treatment.  Treated with Vancomycin, Cefepime, 1 L NS, and started on LR at 150 / hr . Tylenol , toradol, Dilaudid, zofran    Assessment/Plan:  63 y.o. male with hx  COPD, DM type 2, substance use disorder (cocaine, marijuana, tobacco), hx of persistent leukocytosis / thrombocytosis, suspected reactive, IDA, mood d/o, recent ED visit on 9/11 with low back pain, and found indicental 9 mm RLL subsolid nodule, and had recent Ucx + MSSA, And on this encounter found to have L1 discitis / OM. Concern for bacteremia, possible metastatic sites including OM/discitis, ? Septic pulm emboli a possibility, and urinary infection with hematogenous source. Suspect MSSA considering urine but will cover more broadly for now while awaiting additional Cx data.     Suspect bacteremia, with metastatic sites including:  L1 Discitis / OM  ? Pulmonary septic emboli  Urinary infection with hematogenous source - MSSA  Seen in Ed visit with low back pain on 9/11 found indicental 9 mm RLL subsolid nodule, and had recent Ucx +  MSSA. Returned with persistent back pain. Febrile to 38.2 C, other VS wnl. Exam Focal lumbar spinal tenderness. ESR > 140, WBC 12. Lactate 1. Imaging of MRI L spine w wo contrast demonstrating OM / discitis at L1, ? Adjacemt small paraspinous abscess.  -- Transfer to [facility] for multidisciplinary care, ID involvement, availability of IR / NSGY if needed in future  -- Routine ID consult in AM  -- EDP has consulted with NSGY Dr. Gillie, recommends for antibiotic therapy, no current indication for operative treatment.  -- Continue on broad Abx for now with Vancomycin, Cefepime; narrow based on Cx result; anticipate may ultimately be MSSA  -- Echo eval for endocarditis, CT chest eval for septic pulm emboli  -- F/u Bcx, surveillance Cx if need -- Symptomatic mgmt for vertebral OM: tylenol  prn, toradol prn moderate, oxycodone  5 mg  PO q 6 hr prn for severe, Dilaudid 0.5 mg IV q 4 hr prn for breakthrough (would wean opiates as able with hx substance use d/o, ok for now for acute pain)   Substance use disorder  Acknwoledges recent crack use, which he smokes. No active / recent IVDU. Otherwise with hx marijuana, tobacco use. More rare alcohol use per his report  -- Check UDS -- COWS monitoring  -- Nicotine  patch + gum prn, -- Counseled on smoking / substance use cessation  -- TOC for substance use resources   Chronic medical problems:  COPD: Nebs prn  DM 2: Check A1c, CC diet, ssi for very sensitive  Hx persistent leukocytosis/thrombocytosis: Noted; currently has infection above  IDA: Hb 11, slightly down from recent baseline, follow  Mood do: Not currently on medication   Body mass index is 17.43 kg/m. Mild protein-calorie malnutrition, add ensure to trays    DVT prophylaxis:  Lovenox  Code Status:  Full Code Diet:  Diet Orders (From admission, onward)    None      Family Communication:  None   Consults:  NSGY  Admission status:   Inpatient, Telemetry bed  Severity of Illness: The  appropriate patient status for this patient is INPATIENT. Inpatient status is judged to be reasonable and necessary in order to provide the required intensity of service to ensure the patient's safety. The patient's presenting symptoms, physical exam findings, and initial radiographic and laboratory data in the context of their chronic comorbidities is felt to place them at high risk for further clinical deterioration. Furthermore, it is not anticipated that the patient will be medically stable for discharge from the hospital within 2 midnights of admission.   * I certify that at the point of admission it is my clinical judgment that the patient will require inpatient hospital care spanning beyond 2 midnights from the point of admission due to high intensity of service, high risk for further deterioration and high frequency of surveillance required.*   Dorn Dawson, MD [facility]  How to contact the [facility] Attending or Consulting provider 7A - 7P or covering provider during after hours 7P -7A, for this patient.  Check the care team in [facility] and look for a) attending/consulting [facility] provider listed and b) the [facility] team listed Log into [email or web address] and use [facility]'s universal password to access. If you do not have the password, please contact the hospital operator. Locate the [facility] provider you are looking for under [facility] and page to a number that you can be directly reached. If you still have difficulty reaching the provider, please page the [facility] (Director on Call) for the Hospitalists listed on amion for assistance.  03/24/2024, 9:54 PM

## 2024-03-24 NOTE — ED Triage Notes (Signed)
 Pt arrived via POV c/o recurrent thoracic back pain. Pt reports last time he was here, they found a mass in his lower lobe of his lung. Pt reports pain is constant. Pt reports pain under his right rib cage as well.

## 2024-03-24 NOTE — ED Notes (Signed)
 Sepsis care delayed due to MRI not allowing pt to go with any fluids running

## 2024-03-24 NOTE — ED Provider Notes (Signed)
 [place] EMERGENCY DEPARTMENT AT [facility] Provider Note   CSN: [id number] Arrival date & time: 03/24/24  1504     Patient presents with: Back Pain   Gregory Camacho is a 63 y.o. male.   63 year old male with a history of COPD, cocaine and marijuana use who presents emergency department back pain, leg pain, and fever.  Patient reports that over the past month he has had worsening lumbar spine pain.  Says that it does radiate down his legs.  Says his right leg is felt weak.  No bowel or bladder incontinence.  No saddle anesthesia.  Was seen in the emergency department had a CT stone study that showed a lung nodule but no other acute findings.  Urinalysis did appear to be consistent with UTI but the culture ended up growing Staph aureus.  Says that the back pain has worsened.  Is having subjective fevers.  Dysuria as well.  No history of IV drug use or indwelling catheters.       Prior to Admission medications   Medication Sig Start Date End Date Taking? Authorizing Provider  albuterol  (VENTOLIN  HFA) 108 (90 Base) MCG/ACT inhaler Inhale 2 puffs into the lungs every 6 (six) hours as needed for wheezing or shortness of breath. 11/04/21   Darlean Ozell NOVAK, MD  budesonide -formoterol  (SYMBICORT ) 80-4.5 MCG/ACT inhaler Take 2 puffs first thing in am and then another 2 puffs about 12 hours later. 12/15/21   Darlean Ozell NOVAK, MD  cephALEXin  (KEFLEX ) 500 MG capsule Take 1 capsule (500 mg total) by mouth 4 (four) times daily. 03/06/24   Towana Ozell BROCKS, MD  famotidine  (PEPCID ) 20 MG tablet Take 20 mg by mouth daily after supper.    [provider]  HYDROcodone -acetaminophen  (NORCO/VICODIN) 5-325 MG tablet Take 1 tablet by mouth every 6 (six) hours as needed. 03/06/24   Towana Ozell BROCKS, MD  Iron , Ferrous Sulfate , 325 (65 Fe) MG TABS Take 325 mg by mouth every other day. 09/02/21   Cook, Jayce G, DO  lidocaine  (LIDODERM ) 5 % Place 1 patch onto the skin daily. Remove & Discard patch  within 12 hours or as directed by MD 03/06/24   Towana Ozell BROCKS, MD  metFORMIN  (GLUCOPHAGE ) 500 MG tablet Take 1 tablet (500 mg total) by mouth 2 (two) times daily with a meal. 09/02/21   Cook, Jayce G, DO  pantoprazole  (PROTONIX ) 40 MG tablet TAKE 1 TABLET(40 MG) BY MOUTH DAILY 30 TO 60 MINUTES BEFORE FIRST MEAL OF THE DAY 11/07/21   Darlean Ozell NOVAK, MD  traZODone  (DESYREL ) 50 MG tablet Take 1 tablet (50 mg total) by mouth at bedtime. Patient taking differently: Take 50 mg by mouth at bedtime as needed for sleep. 09/01/21   Cook, Jayce G, DO    Allergies: Bc fast pain relief [aspirin -salicylamide-caffeine]    Review of Systems  Updated Vital Signs BP (!) 101/55   Pulse 79   Temp (!) 100.6 F (38.1 C) (Oral)   Resp (!) 23   Ht 5' 8 (1.727 m)   Wt 52 kg   SpO2 95%   BMI 17.43 kg/m   Physical Exam Vitals and nursing note reviewed.  Constitutional:      General: He is not in acute distress.    Appearance: He is well-developed.     Comments: Very uncomfortable and significant pain.  HENT:     Head: Normocephalic and atraumatic.     Right Ear: External ear normal.     Left  Ear: External ear normal.     Nose: Nose normal.  Eyes:     Extraocular Movements: Extraocular movements intact.     Conjunctiva/sclera: Conjunctivae normal.     Pupils: Pupils are equal, round, and reactive to light.  Cardiovascular:     Rate and Rhythm: Normal rate and regular rhythm.     Heart sounds: Normal heart sounds.  Pulmonary:     Effort: Pulmonary effort is normal. No respiratory distress.     Breath sounds: Normal breath sounds.  Abdominal:     General: There is no distension.     Palpations: Abdomen is soft. There is no mass.     Tenderness: There is abdominal tenderness. There is right CVA tenderness and left CVA tenderness. There is no guarding.  Musculoskeletal:     Cervical back: Normal range of motion and neck supple.     Right lower leg: No edema.     Left lower leg: No edema.      Comments: Midline tenderness to palpation at approximately L3-L5. 3/5 strength in right lower extremity limited due to pain.  4/5 strength in left lower extremity due to limited due to pain.  Intact sensation light touch in all dermatomes of bilateral lower extremities.  Skin:    General: Skin is warm and dry.  Neurological:     Mental Status: He is alert.  Psychiatric:        Mood and Affect: Mood normal.        Behavior: Behavior normal.     (all labs ordered are listed, but only abnormal results are displayed) Labs Reviewed  CBC WITH DIFFERENTIAL/PLATELET - Abnormal; Notable for the following components:      Result Value   WBC 12.3 (*)    Hemoglobin 11.0 (*)    HCT 36.3 (*)    Platelets 479 (*)    Neutro Abs 8.6 (*)    Monocytes Absolute 1.7 (*)    All other components within normal limits  COMPREHENSIVE METABOLIC PANEL WITH GFR - Abnormal; Notable for the following components:   Chloride 96 (*)    Glucose, Bld 101 (*)    Total Protein 9.3 (*)    Albumin 3.2 (*)    All other components within normal limits  SEDIMENTATION RATE - Abnormal; Notable for the following components:   Sed Rate >140 (*)    All other components within normal limits  RESP PANEL BY RT-PCR (RSV, FLU A&B, COVID)  RVPGX2  CULTURE, BLOOD (ROUTINE X 2)  CULTURE, BLOOD (ROUTINE X 2)  LACTIC ACID, PLASMA  RAPID URINE DRUG SCREEN, HOSP PERFORMED  C-REACTIVE PROTEIN  I-STAT CHEM 8, ED    EKG: None  Radiology: MR Lumbar Spine W Wo Contrast Result Date: 03/24/2024 CLINICAL DATA:  Initial evaluation for acute back pain, fever. EXAM: MRI LUMBAR SPINE WITHOUT AND WITH CONTRAST TECHNIQUE: Multiplanar and multiecho pulse sequences of the lumbar spine were obtained without and with intravenous contrast. CONTRAST:  5mL GADAVIST GADOBUTROL 1 MMOL/ML IV SOLN COMPARISON:  Radiograph from 03/06/2024 FINDINGS: Segmentation: Standard. Lowest well-formed disc space labeled the L5-S1 level. Alignment: Moderate  dextroscoliosis, apex at L2-3. No significant listhesis. Vertebrae: Vertebral body height maintained without acute or chronic fracture. Bone marrow signal intensity diffusely heterogeneous. No worrisome osseous lesions. There is abnormal marrow edema involving the L1 vertebral body. Hazy inflammatory changes within the adjacent paraspinous soft tissues. Findings raise the concern for acute osteomyelitis at this level. Mild enhancement about the inferior endplate of T12, but with  no other overt evidence for intervening discitis at this time. No epidural involvement. 1.6 cm area along the left anterolateral margin of the T12-L1 disc space could reflect a small abscess versus extruded disc material (series 16, image 4). No other discrete abscess or drainable fluid collection. No other evidence for acute infection elsewhere within the lumbar spine. Conus medullaris and cauda equina: Conus extends to the L1 level. Conus and cauda equina appear normal. Paraspinal and other soft tissues: Paraspinous inflammation adjacent to T12-L1 as above. Paraspinous soft tissues demonstrate no other acute finding. Disc levels: L1-2: Small chronic endplate Schmorl's node deformity. Otherwise negative interspace. Mild facet hypertrophy. No stenosis. L2-3: Disc desiccation with minimal disc bulge. Mild facet hypertrophy. No spinal stenosis. Foramina remain patent. L3-4: Negative interspace. Mild facet hypertrophy. No significant spinal stenosis. Foramina remain patent. L4-5: Degenerate intervertebral disc space narrowing with disc desiccation diffuse disc bulge. Reactive endplate spurring. Mild facet and ligament flavum hypertrophy. No significant spinal stenosis. Moderate bilateral L4 foraminal narrowing. L5-S1: Degenerative intervertebral disc space narrowing with disc desiccation diffuse disc bulge. Reactive endplate spurring. Moderate right worse than left facet hypertrophy. No spinal stenosis. Moderate bilateral L5 foraminal  narrowing. IMPRESSION: 1. Abnormal marrow edema involving the L1 vertebral body with adjacent paraspinous could reflect extruded disc material versus a small abscess. No epidural involvement. 2. No other evidence for acute infection elsewhere within the lumbar spine. 3. Degenerative spondylosis at L4-5 and L5-S1 with resultant moderate bilateral L4 and L5 foraminal stenosis. Electronically Signed   By: Morene Hoard M.D.   On: 03/24/2024 19:29   DG Chest 2 View Result Date: 03/24/2024 CLINICAL DATA:  Back pain history of lung mass EXAM: CHEST - 2 VIEW COMPARISON:  Chest x-ray 09/08/2021, CT 03/06/2024 FINDINGS: The heart size and mediastinal contours are within normal limits. Both lungs are clear. The visualized skeletal structures are unremarkable. Sub solid right lung base density on CT is not well seen radiographically IMPRESSION: No active cardiopulmonary disease. Electronically Signed   By: Luke Bun M.D.   On: 03/24/2024 16:19     Procedures   Medications Ordered in the ED  lactated ringers  infusion ( Intravenous New Bag/Given 03/24/24 1926)  sodium chloride  0.9 % bolus 1,000 mL (0 mLs Intravenous Stopped 03/24/24 2031)  ceFEPIme (MAXIPIME) 2 g in sodium chloride  0.9 % 100 mL IVPB (0 g Intravenous Stopped 03/24/24 1955)  vancomycin (VANCOCIN) IVPB 1000 mg/200 mL premix (0 mg Intravenous Stopped 03/24/24 2046)  ketorolac (TORADOL) 15 MG/ML injection 15 mg (15 mg Intravenous Given 03/24/24 1922)  HYDROmorphone (DILAUDID) injection 0.5 mg (0.5 mg Intravenous Given 03/24/24 1921)  ondansetron  (ZOFRAN ) injection 4 mg (4 mg Intravenous Given 03/24/24 1911)  gadobutrol (GADAVIST) 1 MMOL/ML injection 5 mL (5 mLs Intravenous Contrast Given 03/24/24 1847)  acetaminophen  (TYLENOL ) tablet 1,000 mg (1,000 mg Oral Given 03/24/24 2048)    Clinical Course as of 03/24/24 2208  Mon Mar 24, 2024  1947 Dw Dr Gillie from neurosurgery is reviewing the imaging [RP]  2107 Dr Keturah from hospitalist  consulted [RP]    Clinical Course User Index [RP] Yolande Lamar BROCKS, MD                                 Medical Decision Making Amount and/or Complexity of Data Reviewed Labs: ordered. Radiology: ordered.  Risk OTC drugs. Prescription drug management. Decision regarding hospitalization.   63 year old male history of COPD, cocaine and marijuana use who  presents emergency department back pain, leg pain, and fever  Initial Ddx:  Spinal epidural abscess, discitis osteomyelitis, pyelonephritis, urinary tract infection  MDM/Course:  63 year old male who presents emergency department with lumbar back pain and fever.  No history of IV drug use or obvious risk factors for spinal epidural abscess or discitis osteomyelitis.  On exam does have lumbar spine tenderness to palpation.  Does have some leg weakness that was limited due to pain but upon re-evaluation after pain medication was full strength in bilateral lower extremities.  Code sepsis initiated due to his fever and white blood cell count.  No bowel or bladder incontinence or numbness of his legs.  He has good pulses in both of his feet.  Had an MRI that shows possible spine infection at L1.  Discussed with neurosurgery who do not feel that any operative intervention is warranted.  Admitted to hospitalist for further management.  Did have blood cultures and antibiotics started.  Currently is hemodynamically stable.  This patient presents to the ED for concern of complaints listed in HPI, this involves an extensive number of treatment options, and is a complaint that carries with it a high risk of complications and morbidity. Disposition including potential need for admission considered.   Dispo: Admit to Floor  Records reviewed Outpatient Clinic Notes The following labs were independently interpreted: Chemistry and show no acute abnormality I have reviewed the patients home medications and made adjustments as needed Consults:  Hospitalist and Neurosurgery Social Determinants of health:  Geriatric  Portions of this note were generated with Scientist, clinical (histocompatibility and immunogenetics). Dictation errors may occur despite best attempts at proofreading.     Final diagnoses:  Vertebral osteomyelitis (HCC)  Sepsis, due to unspecified organism, unspecified whether acute organ dysfunction present [facility])    ED Discharge Orders     None          Yolande Lamar BROCKS, MD 03/24/24 2208

## 2024-03-24 NOTE — Progress Notes (Signed)
 Patient ID: Gregory Camacho, male   DOB: [birth year], 63 y.o.   MRN: [id number] Mri reviewed. There is no intracanal component to what appears to be L1 osteomyelitis. There are no neurosurgical indications for operative treatment. An etiology for the apparent infection should be sought so proper abx therapy can be instituted. There is no reason to transport to [facility] for neurosurgical consult or treatment. I have spoken with Dr. Yolande who is taking care of [name] in the [place] ED.

## 2024-03-24 NOTE — ED Notes (Signed)
 ED Provider at bedside.

## 2024-03-24 NOTE — Telephone Encounter (Signed)
 FYI Only or Action Required?: FYI only for provider.  Patient was last seen in primary care on 10/07/2021 by Cook, Jayce G, DO.  Called Nurse Triage reporting Back Pain.  Symptoms began Ongoing.  Interventions attempted: Nothing.  Symptoms are: rapidly worsening.  Triage Disposition: Go to ED Now (or PCP Triage)  Patient/caregiver understands and will follow disposition?: Yes                Copied from CRM #[id number]. Topic: Clinical - Red Word Triage >> Mar 24, 2024 10:11 AM Precious C wrote: [facility] that prompted transfer to Nurse Triage: EXTREME   Called to with extreme pain in back, described the pain as a throbbing toothache pain. Reported pain 100/10. Pt has not seen his PCP in 3 yrs, scheduled pt as New Pt appt with PA- C. Grooms. Pt originally called for medication refill and stated Dr. Bluford chambers not answer request Reason for Disposition  Patient sounds very sick or weak to the triager  Answer Assessment - Initial Assessment Questions 1. ONSET: When did the pain begin? (e.g., minutes, hours, days)     2 weeks ago 2. LOCATION: Where does it hurt? (upper, mid or lower back)     Lower back - throbbing 3. SEVERITY: How bad is the pain?  (e.g., Scale 1-10; mild, moderate, or severe)     10/10 4. PATTERN: Is the pain constant? (e.g., yes, no; constant, intermittent)      constant 5. RADIATION: Does the pain shoot into your legs or somewhere else?     unknown 6. CAUSE:  What do you think is causing the back pain?      Bi lateral back pain 7. BACK OVERUSE:  Any recent lifting of heavy objects, strenuous work or exercise?     no 8. MEDICINES: What have you taken so far for the pain? (e.g., nothing, acetaminophen , NSAIDS)     A little bit of everything 9. NEUROLOGIC SYMPTOMS: Do you have any weakness, numbness, or problems with bowel/bladder control?     no 10. OTHER SYMPTOMS: Do you have any other symptoms? (e.g., fever, abdomen pain, burning  with urination, blood in urine)       Urine infection,  urine is orange  Protocols used: Back Pain-A-AH

## 2024-03-24 NOTE — ED Notes (Signed)
 Pt reminded we need a Urine Sample. Pt reports he knew this, used the bathroom in the waiting room and reports he did not want to collect a Urine Specimen at this time.

## 2024-03-24 NOTE — Progress Notes (Addendum)
 Pharmacy Antibiotic Note  Gregory Camacho is a 63 y.o. male admitted on 03/24/2024 with worsening lumbar spine pain.  Pharmacy has been consulted for vancomycin dosing for lumbar discitis and bacteremia (MSSA in the urine).  -MRI: L1 osteomyelitis  -Blood cultures pending; 9/11 Ucx: 100k MSSA -WBC 12, sCr 0.99 (bl~1), Tmax 100.7  Plan: -Cefepime 2g IV every 12 hours -Vancomycin 1g IV x1 -Vancomycin 500mg  IV every 12 hours (AUC 526, Vd 0.72, TBW, sCr 0.99) -Monitor renal function -Follow up signs of clinical improvement, LOT, de-escalation of antibiotics   Height: 5' 8 (172.7 cm) Weight: 52 kg (114 lb 10.2 oz) IBW/kg (Calculated) : 68.4  Temp (24hrs), Avg:100.7 F (38.2 C), Min:100.6 F (38.1 C), Max:100.7 F (38.2 C)  Recent Labs  Lab 03/24/24 1622 03/24/24 1808  WBC 12.3*  --   CREATININE 0.99  --   LATICACIDVEN  --  1.0    Estimated Creatinine Clearance: 54.7 mL/min (by C-G formula based on SCr of 0.99 mg/dL).    Allergies  Allergen Reactions   Bc Fast Pain Relief [Aspirin -Salicylamide-Caffeine] Other (See Comments)    Upset stomach    Antimicrobials this admission: Cefepime 9/29 >>  Vancomycin 9/29 >>   Microbiology results: 9/29 BCx:  9/11 UCx: MSSA   Thank you for allowing pharmacy to be a part of this patient's care.  Lynwood Poplar, PharmD, BCPS Clinical Pharmacist 03/24/2024 11:00 PM

## 2024-03-25 ENCOUNTER — Inpatient Hospital Stay (HOSPITAL_COMMUNITY)

## 2024-03-25 DIAGNOSIS — I38 Endocarditis, valve unspecified: Secondary | ICD-10-CM | POA: Diagnosis not present

## 2024-03-25 DIAGNOSIS — R7881 Bacteremia: Secondary | ICD-10-CM | POA: Diagnosis not present

## 2024-03-25 LAB — URINALYSIS, W/ REFLEX TO CULTURE (INFECTION SUSPECTED)
Bacteria, UA: NONE SEEN
Bilirubin Urine: NEGATIVE
Glucose, UA: NEGATIVE mg/dL
Hgb urine dipstick: NEGATIVE
Ketones, ur: 5 mg/dL — AB
Nitrite: NEGATIVE
Protein, ur: 100 mg/dL — AB
Specific Gravity, Urine: 1.034 — ABNORMAL HIGH (ref 1.005–1.030)
pH: 5 (ref 5.0–8.0)

## 2024-03-25 LAB — RAPID URINE DRUG SCREEN, HOSP PERFORMED
Amphetamines: NOT DETECTED
Barbiturates: NOT DETECTED
Benzodiazepines: NOT DETECTED
Cocaine: POSITIVE — AB
Opiates: POSITIVE — AB
Tetrahydrocannabinol: NOT DETECTED

## 2024-03-25 LAB — BASIC METABOLIC PANEL WITH GFR
Anion gap: 10 (ref 5–15)
BUN: 23 mg/dL (ref 8–23)
CO2: 27 mmol/L (ref 22–32)
Calcium: 8.2 mg/dL — ABNORMAL LOW (ref 8.9–10.3)
Chloride: 99 mmol/L (ref 98–111)
Creatinine, Ser: 0.99 mg/dL (ref 0.61–1.24)
GFR, Estimated: >60 mL/min (ref >60)
Glucose, Bld: 107 mg/dL — ABNORMAL HIGH (ref 70–99)
Potassium: 4.1 mmol/L (ref 3.5–5.1)
Sodium: 136 mmol/L (ref 135–145)

## 2024-03-25 LAB — CBC
HCT: 32.8 % — ABNORMAL LOW (ref 39.0–52.0)
Hemoglobin: 10 g/dL — ABNORMAL LOW (ref 13.0–17.0)
MCH: 26 pg (ref 26.0–34.0)
MCHC: 30.5 g/dL (ref 30.0–36.0)
MCV: 85.2 fL (ref 80.0–100.0)
Platelets: 563 K/uL — ABNORMAL HIGH (ref 150–400)
RBC: 3.85 MIL/uL — ABNORMAL LOW (ref 4.22–5.81)
RDW: 14.1 % (ref 11.5–15.5)
WBC: 10.4 K/uL (ref 4.0–10.5)
nRBC: 0 % (ref 0.0–0.2)

## 2024-03-25 LAB — BLOOD CULTURE ID PANEL (REFLEXED) - BCID2

## 2024-03-25 LAB — MAGNESIUM: Magnesium: 2.3 mg/dL (ref 1.7–2.4)

## 2024-03-25 LAB — ECHOCARDIOGRAM COMPLETE
Area-P 1/2: 3.99 cm2
Height: 68 in
S' Lateral: 2.8 cm
Weight: 1834.23 [oz_av]

## 2024-03-25 LAB — HEPATITIS C ANTIBODY: HCV Ab: NONREACTIVE

## 2024-03-25 LAB — HEPATITIS B SURFACE ANTIGEN: Hepatitis B Surface Ag: NONREACTIVE

## 2024-03-25 LAB — HEPATITIS B SURFACE ANTIBODY,QUALITATIVE: Hep B S Ab: NONREACTIVE

## 2024-03-25 LAB — C-REACTIVE PROTEIN: CRP: 15.5 mg/dL — ABNORMAL HIGH (ref <1.0)

## 2024-03-25 LAB — CBG MONITORING, ED
Glucose-Capillary: 110 mg/dL — ABNORMAL HIGH (ref 70–99)
Glucose-Capillary: 102 mg/dL — ABNORMAL HIGH (ref 70–99)

## 2024-03-25 LAB — GLUCOSE, CAPILLARY
Glucose-Capillary: 90 mg/dL (ref 70–99)
Glucose-Capillary: 121 mg/dL — ABNORMAL HIGH (ref 70–99)

## 2024-03-25 LAB — PHOSPHORUS: Phosphorus: 2.8 mg/dL (ref 2.5–4.6)

## 2024-03-25 LAB — HIV ANTIBODY (ROUTINE TESTING W REFLEX): HIV Screen 4th Generation wRfx: NONREACTIVE

## 2024-03-25 MED ORDER — INSULIN ASPART 100 UNIT/ML IJ SOLN: 0.0000 [IU] | Freq: Three times a day (TID) | INTRAMUSCULAR | Status: DC

## 2024-03-25 MED ORDER — ENSURE PLUS HIGH PROTEIN PO LIQD
237.0000 mL | Freq: Two times a day (BID) | ORAL | Status: DC
  Administered 2024-03-26 – 2024-03-31 (×9): 237 mL via ORAL
  Filled 2024-03-25 (×4): qty 237

## 2024-03-25 MED ORDER — HYDROMORPHONE HCL 1 MG/ML IJ SOLN
0.5000 mg | INTRAMUSCULAR | Status: DC | PRN
Start: 2024-03-25 — End: 2024-03-31
  Administered 2024-03-25 – 2024-03-31 (×6): 0.5 mg via INTRAVENOUS
  Filled 2024-03-25 (×7): qty 0.5

## 2024-03-25 MED ORDER — CEFAZOLIN SODIUM-DEXTROSE 2-4 GM/100ML-% IV SOLN
2.0000 g | Freq: Three times a day (TID) | INTRAVENOUS | Status: DC
  Administered 2024-03-25 – 2024-03-31 (×18): 2 g via INTRAVENOUS
  Filled 2024-03-25 (×18): qty 100

## 2024-03-25 NOTE — Progress Notes (Signed)
   03/25/24 1604  Vitals  Temp 98.5 F (36.9 C)  Temp Source Oral  BP 122/69  MAP (mmHg) 84  BP Location Right Arm  BP Method Automatic  Patient Position (if appropriate) Lying  Pulse Rate 78  Pulse Rate Source Monitor  Resp 15  Level of Consciousness  Level of Consciousness Alert  MEWS COLOR  MEWS Score Color Green  Oxygen Therapy  SpO2 100 %  O2 Device Room Air  Pain Assessment  Pain Scale 0-10  Pain Score 9  Pain Type Acute pain  Pain Location Back  MEWS Score  MEWS Temp 0  MEWS Systolic 0  MEWS Pulse 0  MEWS RR 0  MEWS LOC 0  MEWS Score 0

## 2024-03-25 NOTE — ED Notes (Signed)
 Contacted pharmacy about Vancomycin, still waiting for it to be brought up and dispensed.

## 2024-03-25 NOTE — Progress Notes (Signed)
 PROGRESS NOTE    Gregory Camacho  FMW:[id number] DOB: [birth year] DOA: 03/24/2024 PCP: Cook, Jayce G, DO   Brief Narrative:    Gregory Camacho is a 63 y.o. male with hx of COPD, DM type 2, substance use disorder (cocaine, marijuana, tobacco), hx of persistent leukocytosis / thrombocytosis, suspected reactive, IDA, mood d/o, who presented with worsening back pain. Unclear when began, but has worsened since around time of last ED visit on 9/11.  Patient has been admitted with concerns for L1 discitis/osteomyelitis and possible abscess.  There is concern for MSSA bacteremia with hematogenous spread given that he recently had urinary tract infection which was positive for MSSA.  He has been started on cefepime and vancomycin empirically and is awaiting transfer to [facility] for ID evaluation.  Neurosurgery does not feel that any operative intervention is currently indicated.  ID currently recommending switch to Ancef  and will follow once transferred to assess the need for possible biopsy.  Assessment & Plan:   Principal Problem:   Bacteremia  Assessment and Plan:   Suspect bacteremia, with metastatic sites including:  L1 Discitis / OM  ? Pulmonary septic emboli  Urinary infection with hematogenous source - MSSA  -- Transfer to [facility] for multidisciplinary care, ID involvement, availability of IR / NSGY if needed in future  -- Appreciate ID evaluation with recommendations to switch antibiotics to Ancef  and will consider biopsy if needed upon further in person evaluation at [facility] -- EDP has consulted with NSGY Dr. Gillie, recommends for antibiotic therapy, no current indication for operative treatment.  -- Continue on broad Abx for now with Vancomycin, Cefepime; narrow based on Cx result; anticipate may ultimately be MSSA, no growth on cultures noted thus far -- Echo eval for endocarditis, CT chest without any findings of septic emboli -- F/u Bcx, surveillance Cx if need -- Symptomatic mgmt for  vertebral OM: tylenol  prn, toradol prn moderate, oxycodone  5 mg PO q 6 hr prn for severe, Dilaudid 0.5 mg IV q 4 hr prn for breakthrough (would wean opiates as able with hx substance use d/o, ok for now for acute pain)    Substance use disorder  Acknwoledges recent crack use, which he smokes. No active / recent IVDU. Otherwise with hx marijuana, tobacco use. More rare alcohol use per his report  -- UDS positive for cocaine and opiates -- COWS monitoring  -- Nicotine  patch + gum prn, -- Counseled on smoking / substance use cessation  -- TOC for substance use resources    Chronic medical problems:  COPD: Nebs prn  DM 2: Check A1c, CC diet, ssi for very sensitive  Hx persistent leukocytosis/thrombocytosis: Noted; currently has infection above  IDA: Hb 11, slightly down from recent baseline, follow  Mood do: Not currently on medication     DVT prophylaxis: Lovenox  Code Status: Full Family Communication: None at bedside Disposition Plan:  Status is: Inpatient Remains inpatient appropriate because: Need for IV medications and inpatient evaluation.   Consultants:  EDP spoke with neurosurgery Dr. Gillie ID spoke with Dr. Overton  Procedures:  None  Antimicrobials:  Anti-infectives (From admission, onward)    Start     Dose/Rate Route Frequency Ordered Stop   03/25/24 0800  ceFEPIme (MAXIPIME) 2 g in sodium chloride  0.9 % 100 mL IVPB  Status:  Discontinued        2 g 200 mL/hr over 30 Minutes Intravenous Every 12 hours 03/24/24 2217 03/25/24 1124   03/25/24 0800  vancomycin (VANCOREADY) IVPB 500  mg/100 mL  Status:  Discontinued        500 mg 100 mL/hr over 60 Minutes Intravenous Every 12 hours 03/24/24 2315 03/25/24 1124   03/24/24 1745  ceFEPIme (MAXIPIME) 2 g in sodium chloride  0.9 % 100 mL IVPB        2 g 200 mL/hr over 30 Minutes Intravenous  Once 03/24/24 1743 03/24/24 1955   03/24/24 1745  vancomycin (VANCOCIN) IVPB 1000 mg/200 mL premix        1,000 mg 200 mL/hr over 60  Minutes Intravenous  Once 03/24/24 1743 03/24/24 2046       Subjective: Patient seen and evaluated today with improvement in back pain noted after multiple pain medications provided.  No further fever has been noted.  Objective: Vitals:   03/25/24 0900 03/25/24 0930 03/25/24 1000 03/25/24 1100  BP: 116/70  131/83 124/70  Pulse: 92     Resp: (!) 34 (!) 22  (!) 28  Temp:      TempSrc:      SpO2: 98%     Weight:      Height:        Intake/Output Summary (Last 24 hours) at 03/25/2024 1125 Last data filed at 03/25/2024 0800 Gross per 24 hour  Intake 100 ml  Output 400 ml  Net -300 ml   Filed Weights   03/24/24 1543  Weight: 52 kg    Examination:  General exam: Appears calm and comfortable  Respiratory system: Clear to auscultation. Respiratory effort normal. Cardiovascular system: S1 & S2 heard, RRR.  Gastrointestinal system: Abdomen is soft Central nervous system: Alert and awake Extremities: No edema, low back spinous process tenderness noted to palpation Skin: No significant lesions noted Psychiatry: Flat affect.    Data Reviewed: I have personally reviewed following labs and imaging studies  CBC: Recent Labs  Lab 03/24/24 1622 03/25/24 0338  WBC 12.3* 10.4  NEUTROABS 8.6*  --   HGB 11.0* 10.0*  HCT 36.3* 32.8*  MCV 86.0 85.2  PLT 479* 563*   Basic Metabolic Panel: Recent Labs  Lab 03/24/24 1622 03/25/24 0338  NA 135 136  K 4.7 4.1  CL 96* 99  CO2 24 27  GLUCOSE 101* 107*  BUN 23 23  CREATININE 0.99 0.99  CALCIUM 8.9 8.2*  MG  --  2.3  PHOS  --  2.8   GFR: Estimated Creatinine Clearance: 54.7 mL/min (by C-G formula based on SCr of 0.99 mg/dL). Liver Function Tests: Recent Labs  Lab 03/24/24 1622  AST 24  ALT 16  ALKPHOS 72  BILITOT 0.6  PROT 9.3*  ALBUMIN 3.2*   No results for input(s): LIPASE, AMYLASE in the last 168 hours. No results for input(s): AMMONIA in the last 168 hours. Coagulation Profile: No results for  input(s): INR, PROTIME in the last 168 hours. Cardiac Enzymes: No results for input(s): CKTOTAL, CKMB, CKMBINDEX, TROPONINI in the last 168 hours. BNP (last 3 results) No results for input(s): PROBNP in the last 8760 hours. HbA1C: No results for input(s): HGBA1C in the last 72 hours. CBG: Recent Labs  Lab 03/25/24 0726 03/25/24 1105  GLUCAP 110* 102*   Lipid Profile: No results for input(s): CHOL, HDL, LDLCALC, TRIG, CHOLHDL, LDLDIRECT in the last 72 hours. Thyroid Function Tests: No results for input(s): TSH, T4TOTAL, FREET4, T3FREE, THYROIDAB in the last 72 hours. Anemia Panel: No results for input(s): VITAMINB12, FOLATE, FERRITIN, TIBC, IRON , RETICCTPCT in the last 72 hours. Sepsis Labs: Recent Labs  Lab 03/24/24 1808  LATICACIDVEN 1.0    Recent Results (from the past 240 hours)  Blood culture (routine x 2)     Status: None (Preliminary result)   Collection Time: 03/24/24  6:06 PM   Specimen: Left Antecubital; Blood  Result Value Ref Range Status   Specimen Description LEFT ANTECUBITAL  Final   Special Requests AEROBIC BOTTLE ONLY Blood Culture adequate volume  Final   Culture   Final    NO GROWTH < 12 HOURS Performed at [facility], [address] [place], [place] [address]    Report Status PENDING  Incomplete  Blood culture (routine x 2)     Status: None (Preliminary result)   Collection Time: 03/24/24  6:08 PM   Specimen: Left Antecubital; Blood  Result Value Ref Range Status   Specimen Description LEFT ANTECUBITAL  Final   Special Requests   Final    BOTTLES DRAWN AEROBIC AND ANAEROBIC Blood Culture adequate volume   Culture   Final    NO GROWTH < 12 HOURS Performed at [facility], [address] [place], [place] [address]    Report Status PENDING  Incomplete  Resp panel by RT-PCR (RSV, Flu A&B, Covid) Anterior Nasal Swab     Status: None   Collection Time: 03/24/24  6:28 PM   Specimen: Anterior Nasal Swab   Result Value Ref Range Status   SARS Coronavirus 2 by RT PCR NEGATIVE NEGATIVE Final    Comment: (NOTE) SARS-CoV-2 target nucleic acids are NOT DETECTED.  The SARS-CoV-2 RNA is generally detectable in upper respiratory specimens during the acute phase of infection. The lowest concentration of SARS-CoV-2 viral copies this assay can detect is 138 copies/mL. A negative result does not preclude SARS-Cov-2 infection and should not be used as the sole basis for treatment or other patient management decisions. A negative result may occur with  improper specimen collection/handling, submission of specimen other than nasopharyngeal swab, presence of viral mutation(s) within the areas targeted by this assay, and inadequate number of viral copies(<138 copies/mL). A negative result must be combined with clinical observations, patient history, and epidemiological information. The expected result is Negative.  Fact Sheet for Patients:  BloggerCourse.com  Fact Sheet for Healthcare Providers:  SeriousBroker.it  This test is no t yet approved or cleared by the [place]  FDA and  has been authorized for detection and/or diagnosis of SARS-CoV-2 by FDA under an Emergency Use Authorization (EUA). This EUA will remain  in effect (meaning this test can be used) for the duration of the COVID-19 declaration under Section 564(b)(1) of the Act, 21 U.S.C.section 360bbb-3(b)(1), unless the authorization is terminated  or revoked sooner.       Influenza A by PCR NEGATIVE NEGATIVE Final   Influenza B by PCR NEGATIVE NEGATIVE Final    Comment: (NOTE) The Xpert Xpress SARS-CoV-2/FLU/RSV plus assay is intended as an aid in the diagnosis of influenza from Nasopharyngeal swab specimens and should not be used as a sole basis for treatment. Nasal washings and aspirates are unacceptable for Xpert Xpress SARS-CoV-2/FLU/RSV testing.  Fact Sheet for  Patients: BloggerCourse.com  Fact Sheet for Healthcare Providers: SeriousBroker.it  This test is not yet approved or cleared by the [place]  FDA and has been authorized for detection and/or diagnosis of SARS-CoV-2 by FDA under an Emergency Use Authorization (EUA). This EUA will remain in effect (meaning this test can be used) for the duration of the COVID-19 declaration under Section 564(b)(1) of the Act, 21 U.S.C. section 360bbb-3(b)(1), unless the authorization is  terminated or revoked.     Resp Syncytial Virus by PCR NEGATIVE NEGATIVE Final    Comment: (NOTE) Fact Sheet for Patients: BloggerCourse.com  Fact Sheet for Healthcare Providers: SeriousBroker.it  This test is not yet approved or cleared by the [place]  FDA and has been authorized for detection and/or diagnosis of SARS-CoV-2 by FDA under an Emergency Use Authorization (EUA). This EUA will remain in effect (meaning this test can be used) for the duration of the COVID-19 declaration under Section 564(b)(1) of the Act, 21 U.S.C. section 360bbb-3(b)(1), unless the authorization is terminated or revoked.  Performed at [facility], [address], [place], [place] [address]          Radiology Studies: CT CHEST WO CONTRAST Result Date: 03/24/2024 EXAM: CT CHEST WITHOUT CONTRAST 03/24/2024 11:03:11 PM TECHNIQUE: CT of the chest was performed without the administration of intravenous contrast. Multiplanar reformatted images are provided for review. Automated exposure control, iterative reconstruction, and/or weight based adjustment of the mA/kV was utilized to reduce the radiation dose to as low as reasonably achievable. COMPARISON: Comparison with chest radiograph 03/24/2024 and CT chest 09/23/2021 acceptable. CLINICAL HISTORY: Eval for pulm septic emboli, suspect bacteremia + metastatic infection. recent RLL pulm  nodule. Evaluate for metastatic infection, recent RLL pulmonary nodule. FINDINGS: MEDIASTINUM: Heart and pericardium are unremarkable. The central airways are clear. LYMPH NODES: Perifissural lymph nodes are unchanged compared to 09/23/2021. No mediastinal or axillary lymphadenopathy. LUNGS AND PLEURA: Upper lobe predominant emphysema. Small left and trace right pleural effusions. Scarring in the lower lobes. The semisolid nodule in the lateral right lower lobe is more confluent today compared to 03/06/2024 and measures 2.0 x 1.1 cm (series 3 image 114). No pulmonary edema. No pneumothorax. SOFT TISSUES/BONES: No acute abnormality of the bones or soft tissues. UPPER ABDOMEN: Limited images of the upper abdomen demonstrates no acute abnormality. IMPRESSION: 1. Confluent subpleural nodule in the lateral right lower lobe in the area of the previously seen subsolid nodule, recommend follow-up CT in 3 months. 2. Small left and trace right pleural effusions. Electronically signed by: Norman Gatlin MD 03/24/2024 11:22 PM EDT RP Workstation: [id number]   MR Lumbar Spine W Wo Contrast Result Date: 03/24/2024 CLINICAL DATA:  Initial evaluation for acute back pain, fever. EXAM: MRI LUMBAR SPINE WITHOUT AND WITH CONTRAST TECHNIQUE: Multiplanar and multiecho pulse sequences of the lumbar spine were obtained without and with intravenous contrast. CONTRAST:  5mL GADAVIST GADOBUTROL 1 MMOL/ML IV SOLN COMPARISON:  Radiograph from 03/06/2024 FINDINGS: Segmentation: Standard. Lowest well-formed disc space labeled the L5-S1 level. Alignment: Moderate dextroscoliosis, apex at L2-3. No significant listhesis. Vertebrae: Vertebral body height maintained without acute or chronic fracture. Bone marrow signal intensity diffusely heterogeneous. No worrisome osseous lesions. There is abnormal marrow edema involving the L1 vertebral body. Hazy inflammatory changes within the adjacent paraspinous soft tissues. Findings raise the concern for  acute osteomyelitis at this level. Mild enhancement about the inferior endplate of T12, but with no other overt evidence for intervening discitis at this time. No epidural involvement. 1.6 cm area along the left anterolateral margin of the T12-L1 disc space could reflect a small abscess versus extruded disc material (series 16, image 4). No other discrete abscess or drainable fluid collection. No other evidence for acute infection elsewhere within the lumbar spine. Conus medullaris and cauda equina: Conus extends to the L1 level. Conus and cauda equina appear normal. Paraspinal and other soft tissues: Paraspinous inflammation adjacent to T12-L1 as above. Paraspinous soft tissues demonstrate no other acute  finding. Disc levels: L1-2: Small chronic endplate Schmorl's node deformity. Otherwise negative interspace. Mild facet hypertrophy. No stenosis. L2-3: Disc desiccation with minimal disc bulge. Mild facet hypertrophy. No spinal stenosis. Foramina remain patent. L3-4: Negative interspace. Mild facet hypertrophy. No significant spinal stenosis. Foramina remain patent. L4-5: Degenerate intervertebral disc space narrowing with disc desiccation diffuse disc bulge. Reactive endplate spurring. Mild facet and ligament flavum hypertrophy. No significant spinal stenosis. Moderate bilateral L4 foraminal narrowing. L5-S1: Degenerative intervertebral disc space narrowing with disc desiccation diffuse disc bulge. Reactive endplate spurring. Moderate right worse than left facet hypertrophy. No spinal stenosis. Moderate bilateral L5 foraminal narrowing. IMPRESSION: 1. Abnormal marrow edema involving the L1 vertebral body with adjacent paraspinous could reflect extruded disc material versus a small abscess. No epidural involvement. 2. No other evidence for acute infection elsewhere within the lumbar spine. 3. Degenerative spondylosis at L4-5 and L5-S1 with resultant moderate bilateral L4 and L5 foraminal stenosis. Electronically  Signed   By: Morene Hoard M.D.   On: 03/24/2024 19:29   DG Chest 2 View Result Date: 03/24/2024 CLINICAL DATA:  Back pain history of lung mass EXAM: CHEST - 2 VIEW COMPARISON:  Chest x-ray 09/08/2021, CT 03/06/2024 FINDINGS: The heart size and mediastinal contours are within normal limits. Both lungs are clear. The visualized skeletal structures are unremarkable. Sub solid right lung base density on CT is not well seen radiographically IMPRESSION: No active cardiopulmonary disease. Electronically Signed   By: Luke Bun M.D.   On: 03/24/2024 16:19        Scheduled Meds:  enoxaparin  (LOVENOX ) injection  40 mg Subcutaneous QHS   feeding supplement  237 mL Oral BID BM   folic acid  1 mg Oral Daily   insulin aspart  0-6 Units Subcutaneous TID WC   multivitamin with minerals  1 tablet Oral Daily   sodium chloride  flush  3 mL Intravenous Q12H   thiamine  100 mg Oral Daily   Or   thiamine  100 mg Intravenous Daily   Continuous Infusions:  lactated ringers  100 mL/hr at 03/25/24 0507     LOS: 1 day    Time spent: 55 minutes    Sharlize Hoar D Maree, DO [facility]  If 7PM-7AM, please contact night-coverage [email or web address] 03/25/2024, 11:25 AM

## 2024-03-25 NOTE — Evaluation (Signed)
 Physical Therapy Evaluation Patient Details Name: Gregory Camacho MRN: [id number] DOB: 04/28/1961 Today's Date: 03/25/2024  History of Present Illness  Gregory Camacho is a 63 y.o. male with hx of COPD, DM type 2, substance use disorder (cocaine, marijuana, tobacco), hx of persistent leukocytosis / thrombocytosis, suspected reactive, IDA, mood d/o, who presented with worsening back pain. Unclear when began, but has worsened since around time of last ED visit on 9/11. Denies fall or other injury. Notes that sometimes he feels weak in his legs, but denies any active numbness / weakness. Otherwise recently has had dysuria. He has no indwelling lines. No other back pain, joint pain. Does c/o headache. No vision changes, speech changes, numbness, weakness,. No wounds, although reports having recent ulceration in his nose. Denies any active or recent IVDU. Acknowledges smoking crack to try and help with pain. Rare alcohol use per his report.   Clinical Impression  Patient requires increased time for sitting up at bedside with labored movement, c/o abdominal, low back pain, fair/good return for transferring to chair using his cane and tolerated walking in room and hallway before having to sit due to c/o fatigue and generalized weakness. Patient tolerated sitting up in chair after therapy to eat breakfast. Patient will benefit from continued skilled physical therapy in hospital and recommended venue below to increase strength, balance, endurance for safe ADLs and gait.          If plan is discharge home, recommend the following: A little help with walking and/or transfers;A little help with bathing/dressing/bathroom;Assistance with cooking/housework;Assist for transportation;Help with stairs or ramp for entrance   Can travel by private vehicle        Equipment Recommendations None recommended by PT  Recommendations for Other Services       Functional Status Assessment Patient has had a recent decline  in their functional status and demonstrates the ability to make significant improvements in function in a reasonable and predictable amount of time.     Precautions / Restrictions Precautions Precautions: Fall Restrictions Weight Bearing Restrictions Per Provider Order: No      Mobility  Bed Mobility Overal bed mobility: Needs Assistance Bed Mobility: Supine to Sit     Supine to sit: Contact guard, Supervision     General bed mobility comments: slow labored movement    Transfers Overall transfer level: Needs assistance Equipment used: Straight cane Transfers: Sit to/from Stand, Bed to chair/wheelchair/BSC Sit to Stand: Contact guard assist   Step pivot transfers: Contact guard assist       General transfer comment: increased time, labored movement    Ambulation/Gait Ambulation/Gait assistance: Contact guard assist, Min assist Gait Distance (Feet): 20 Feet Assistive device: Straight cane Gait Pattern/deviations: Step-to pattern, Decreased stride length, Decreased step length - right, Decreased step length - left, Trunk flexed Gait velocity: decreased     General Gait Details: slow labored movement without loss of balance, but limited mostly due to c/o fatigue  Stairs            Wheelchair Mobility     Tilt Bed    Modified Rankin (Stroke Patients Only)       Balance Overall balance assessment: Needs assistance Sitting-balance support: Feet unsupported, No upper extremity supported Sitting balance-Leahy Scale: Fair Sitting balance - Comments: fair/good seated at EOB   Standing balance support: During functional activity, Single extremity supported Standing balance-Leahy Scale: Fair Standing balance comment: using SPC  Pertinent Vitals/Pain Pain Assessment Pain Assessment: Faces Faces Pain Scale: Hurts little more Pain Location: stomach and back Pain Descriptors / Indicators: Discomfort Pain  Intervention(s): Limited activity within patient's tolerance, Monitored during session, Repositioned    Home Living Family/patient expects to be discharged to:: Private residence Living Arrangements: Alone Available Help at Discharge: Family;Available 24 hours/day Type of Home: Apartment Home Access: Ramped entrance       Home Layout: One level Home Equipment: Agricultural consultant (2 wheels);Cane - single point      Prior Function Prior Level of Function : Independent/Modified Independent             Mobility Comments: household ambulation using SPC ADLs Comments: Independent for household, assisted for community ADLs     Extremity/Trunk Assessment   Upper Extremity Assessment Upper Extremity Assessment: Overall WFL for tasks assessed    Lower Extremity Assessment Lower Extremity Assessment: Generalized weakness    Cervical / Trunk Assessment Cervical / Trunk Assessment: Kyphotic  Communication   Communication Communication: No apparent difficulties    Cognition Arousal: Alert Behavior During Therapy: WFL for tasks assessed/performed   PT - Cognitive impairments: No apparent impairments                         Following commands: Intact       Cueing Cueing Techniques: Verbal cues     General Comments      Exercises     Assessment/Plan    PT Assessment Patient needs continued PT services  PT Problem List Decreased strength;Decreased activity tolerance;Decreased balance;Decreased mobility       PT Treatment Interventions DME instruction;Gait training;Stair training;Functional mobility training;Therapeutic activities;Therapeutic exercise;Balance training;Patient/family education    PT Goals (Current goals can be found in the Care Plan section)  Acute Rehab PT Goals Patient Stated Goal: return home with family to assist PT Goal Formulation: With patient Time For Goal Achievement: 04/08/24 Potential to Achieve Goals: Good    Frequency Min  2X/week     Co-evaluation               AM-PAC PT 6 Clicks Mobility  Outcome Measure Help needed turning from your back to your side while in a flat bed without using bedrails?: A Little Help needed moving from lying on your back to sitting on the side of a flat bed without using bedrails?: A Little Help needed moving to and from a bed to a chair (including a wheelchair)?: A Little Help needed standing up from a chair using your arms (e.g., wheelchair or bedside chair)?: A Little Help needed to walk in hospital room?: A Little Help needed climbing 3-5 steps with a railing? : A Lot 6 Click Score: 17    End of Session   Activity Tolerance: Patient tolerated treatment well;Patient limited by fatigue Patient left: in chair;with call bell/phone within reach Nurse Communication: Mobility status PT Visit Diagnosis: Unsteadiness on feet (R26.81);Other abnormalities of gait and mobility (R26.89);Muscle weakness (generalized) (M62.81)    Time: 9154-9083 PT Time Calculation (min) (ACUTE ONLY): 31 min   Charges:   PT Evaluation $PT Eval Moderate Complexity: 1 Mod PT Treatments $Therapeutic Activity: 23-37 mins PT General Charges $$ ACUTE PT VISIT: 1 Visit         12:20 PM, 03/25/24 Lynwood Music, MPT Physical Therapist with [facility] [phone] office [phone] mobile phone

## 2024-03-25 NOTE — Progress Notes (Signed)
 Pt arrived to 6 north room 31 viz PTAR alart and oriented x4. Pain level 8/10 in back. Bed in lowest position. Call light in reach. Bed alarm on. All needs met at this time.

## 2024-03-25 NOTE — Plan of Care (Signed)
  Problem: Acute Rehab PT Goals(only PT should resolve) Goal: Pt Will Go Supine/Side To Sit Outcome: Progressing Flowsheets (Taken 03/25/2024 1222) Pt will go Supine/Side to Sit:  with modified independence  with supervision Goal: Patient Will Transfer Sit To/From Stand Outcome: Progressing Flowsheets (Taken 03/25/2024 1222) Patient will transfer sit to/from stand:  with modified independence  with supervision Goal: Pt Will Transfer Bed To Chair/Chair To Bed Outcome: Progressing Flowsheets (Taken 03/25/2024 1222) Pt will Transfer Bed to Chair/Chair to Bed:  with modified independence  with supervision Goal: Pt Will Ambulate Outcome: Progressing Flowsheets (Taken 03/25/2024 1222) Pt will Ambulate:  50 feet  with supervision  with cane   12:23 PM, 03/25/24 Lynwood Music, MPT Physical Therapist with [facility] [phone] office [phone] mobile phone

## 2024-03-25 NOTE — Progress Notes (Signed)
 ID aware of transfer here to [facility]   Will see patient on 10/01. Agree with plan to treat with Cefazolin  for now pending further work up.    [name], MSN, NP-C [facility] for Infectious Disease [facility]  [email or web address] Pager: [phone] Office: [phone] [facility] Main Line: [phone] *Secure Chat Communication Welcome  Total Encounter Time: no charge

## 2024-03-25 NOTE — Progress Notes (Signed)
 PHARMACY - PHYSICIAN COMMUNICATION CRITICAL VALUE ALERT - BLOOD CULTURE IDENTIFICATION (BCID)  Gregory Camacho is an 63 y.o. adult who presented to [facility] on 03/24/2024 with a chief complaint of Bacteremia  Assessment:  2 of 3 blood culture bottles (both aerobic) positive for GPC in Clusters, BCID positive for staph aureus, methicillin sensitive  Name of physician (or Provider) Contacted: None contacted, antibiotics already appropriately covering and this was the suspected infectious agent  Current antibiotics: Cefazolin   Changes to prescribed antibiotics recommended:  Patient is on recommended antibiotics - No changes needed  Results for orders placed or performed during the hospital encounter of 03/24/24  Blood Culture ID Panel (Reflexed) (Collected: 03/24/2024  6:08 PM)  Result Value Ref Range   Enterococcus faecalis NOT DETECTED NOT DETECTED   Enterococcus Faecium NOT DETECTED NOT DETECTED   Listeria monocytogenes NOT DETECTED NOT DETECTED   Staphylococcus species DETECTED (A) NOT DETECTED   Staphylococcus aureus (BCID) DETECTED (A) NOT DETECTED   Staphylococcus epidermidis NOT DETECTED NOT DETECTED   Staphylococcus lugdunensis NOT DETECTED NOT DETECTED   Streptococcus species NOT DETECTED NOT DETECTED   Streptococcus agalactiae NOT DETECTED NOT DETECTED   Streptococcus pneumoniae NOT DETECTED NOT DETECTED   Streptococcus pyogenes NOT DETECTED NOT DETECTED   A.calcoaceticus-baumannii NOT DETECTED NOT DETECTED   Bacteroides fragilis NOT DETECTED NOT DETECTED   Enterobacterales NOT DETECTED NOT DETECTED   Enterobacter cloacae complex NOT DETECTED NOT DETECTED   Escherichia coli NOT DETECTED NOT DETECTED   Klebsiella aerogenes NOT DETECTED NOT DETECTED   Klebsiella oxytoca NOT DETECTED NOT DETECTED   Klebsiella pneumoniae NOT DETECTED NOT DETECTED   Proteus species NOT DETECTED NOT DETECTED   Salmonella species NOT DETECTED NOT DETECTED   Serratia marcescens NOT DETECTED NOT  DETECTED   Haemophilus influenzae NOT DETECTED NOT DETECTED   Neisseria meningitidis NOT DETECTED NOT DETECTED   Pseudomonas aeruginosa NOT DETECTED NOT DETECTED   Stenotrophomonas maltophilia NOT DETECTED NOT DETECTED   Candida albicans NOT DETECTED NOT DETECTED   Candida auris NOT DETECTED NOT DETECTED   Candida glabrata NOT DETECTED NOT DETECTED   Candida krusei NOT DETECTED NOT DETECTED   Candida parapsilosis NOT DETECTED NOT DETECTED   Candida tropicalis NOT DETECTED NOT DETECTED   Cryptococcus neoformans/gattii NOT DETECTED NOT DETECTED   Meth resistant mecA/C and MREJ NOT DETECTED NOT DETECTED    [name] 03/25/2024  5:18 PM

## 2024-03-25 NOTE — Progress Notes (Signed)
   03/25/24 1423  TOC Brief Assessment  Insurance and Status Reviewed  Patient has primary care physician Yes  Home environment has been reviewed Home  Prior level of function: Independent  Prior/Current Home Services No current home services  Social Drivers of Health Review SDOH reviewed no interventions necessary  Readmission risk has been reviewed Yes  Transition of care needs no transition of care needs at this time   Transfer to [facility].  Transition of Care Department [facility]) has reviewed patient and no TOC needs have been identified at this time. We will continue to monitor patient advancement through interdisciplinary progression rounds. If new patient transition needs arise, please place a TOC consult.

## 2024-03-25 NOTE — Progress Notes (Signed)
 Dinner tray ordered.

## 2024-03-26 DIAGNOSIS — B9561 Methicillin susceptible Staphylococcus aureus infection as the cause of diseases classified elsewhere: Secondary | ICD-10-CM

## 2024-03-26 DIAGNOSIS — M4626 Osteomyelitis of vertebra, lumbar region: Secondary | ICD-10-CM

## 2024-03-26 DIAGNOSIS — R7881 Bacteremia: Secondary | ICD-10-CM | POA: Diagnosis not present

## 2024-03-26 DIAGNOSIS — F1490 Cocaine use, unspecified, uncomplicated: Secondary | ICD-10-CM

## 2024-03-26 LAB — BASIC METABOLIC PANEL WITH GFR
Anion gap: 10 (ref 5–15)
BUN: 12 mg/dL (ref 8–23)
CO2: 24 mmol/L (ref 22–32)
Calcium: 8.1 mg/dL — ABNORMAL LOW (ref 8.9–10.3)
Chloride: 99 mmol/L (ref 98–111)
Creatinine, Ser: 0.87 mg/dL (ref 0.61–1.24)
GFR, Estimated: >60 mL/min (ref >60)
Glucose, Bld: 90 mg/dL (ref 70–99)
Potassium: 4.2 mmol/L (ref 3.5–5.1)
Sodium: 133 mmol/L — ABNORMAL LOW (ref 135–145)

## 2024-03-26 LAB — HEPATITIS B CORE ANTIBODY, TOTAL: HEP B CORE AB: NEGATIVE

## 2024-03-26 LAB — URINE CULTURE: Culture: NO GROWTH

## 2024-03-26 LAB — CBC
HCT: 29 % — ABNORMAL LOW (ref 39.0–52.0)
Hemoglobin: 9.2 g/dL — ABNORMAL LOW (ref 13.0–17.0)
MCH: 25.9 pg — ABNORMAL LOW (ref 26.0–34.0)
MCHC: 31.7 g/dL (ref 30.0–36.0)
MCV: 81.7 fL (ref 80.0–100.0)
Platelets: 483 K/uL — ABNORMAL HIGH (ref 150–400)
RBC: 3.55 MIL/uL — ABNORMAL LOW (ref 4.22–5.81)
RDW: 14 % (ref 11.5–15.5)
WBC: 10.2 K/uL (ref 4.0–10.5)
nRBC: 0 % (ref 0.0–0.2)

## 2024-03-26 LAB — HEMOGLOBIN A1C
Hgb A1c MFr Bld: 6.3 % — ABNORMAL HIGH (ref 4.8–5.6)
Mean Plasma Glucose: 134.11 mg/dL

## 2024-03-26 LAB — GLUCOSE, CAPILLARY
Glucose-Capillary: 81 mg/dL (ref 70–99)
Glucose-Capillary: 130 mg/dL — ABNORMAL HIGH (ref 70–99)
Glucose-Capillary: 85 mg/dL (ref 70–99)
Glucose-Capillary: 109 mg/dL — ABNORMAL HIGH (ref 70–99)

## 2024-03-26 LAB — MAGNESIUM: Magnesium: 1.9 mg/dL (ref 1.7–2.4)

## 2024-03-26 MED ORDER — HEPATITIS B VAC RECOMB ADJ 20 MCG/0.5ML IM SOSY
0.5000 mL | PREFILLED_SYRINGE | Freq: Once | INTRAMUSCULAR | Status: AC
  Administered 2024-03-26: 0.5 mL via INTRAMUSCULAR
  Filled 2024-03-26: qty 0.5

## 2024-03-26 NOTE — Progress Notes (Signed)
 PROGRESS NOTE    Gregory Camacho  FMW:[id number] DOB: 1960-07-31 DOA: 03/24/2024 PCP: Cook, Jayce G, DO   Brief Narrative:    Gregory Camacho is a 63 y.o. male with hx of COPD, DM type 2, substance use disorder (cocaine, marijuana, tobacco), hx of persistent leukocytosis / thrombocytosis, suspected reactive, IDA, mood d/o, who presented with worsening back pain. Unclear when began, but has worsened since around time of last ED visit on 9/11.  Patient has been admitted with concerns for L1 discitis/osteomyelitis and possible abscess.  There is concern for MSSA bacteremia with hematogenous spread given that he recently had urinary tract infection which was positive for MSSA.  MRI of the spine is concerning for L1 discitis/osteomyelitis.  Patient transferred to [facility] on 9/30 for multidisciplinary evaluation/management    Subjective: Patient seen and examined at the bedside.  He says he feels much much better today.  He denies any significant back pain.  He states he is not able to move his legs better.  Afebrile  Vital signs are stable Assessment & Plan:   Principal Problem:   Bacteremia   Suspect bacteremia, with metastatic sites including:  L1 Discitis / OM  ? Pulmonary septic emboli  Urinary infection with hematogenous source - MSSA  -- Transferred to [facility] for multidisciplinary care, ID involvement, availability of IR / NSGY if needed in future  -- Continue IV cefazolin  and follow  blood culture. -- EDP has consulted with NSGY Dr. Gillie, recommends for antibiotic therapy, no current indication for operative treatment.  --- CT chest is negative for septic emboli. ID recommending TEE.  They have also ordered a cervical spine MRI -- Symptomatic mgmt for vertebral OM: tylenol  prn, toradol prn moderate, oxycodone  5 mg PO q 6 hr prn for severe, Dilaudid 0.5 mg IV q 4 hr prn for breakthrough (would wean opiates as able with hx substance use d/o, ok for now for acute pain)    Substance  use disorder  Acknwoledges recent crack use, which he smokes. No active / recent IVDU. Otherwise with hx marijuana, tobacco use. More rare alcohol use per his report  -- UDS positive for cocaine and opiates -- COWS monitoring  -- Nicotine  patch + gum prn, -- Counseled on smoking / substance use cessation  -- TOC for substance use resources    Chronic medical problems:  COPD: Nebs prn  DM 2: Check A1c, CC diet, ssi for very sensitive  Hx persistent leukocytosis/thrombocytosis: Noted; currently has infection above  IDA: Hb 11, slightly down from recent baseline, follow  Mood do: Not currently on medication     DVT prophylaxis: Lovenox  Code Status: Full Family Communication: None at bedside Disposition Plan:  Status is: Inpatient Remains inpatient appropriate because: Need for IV medications and inpatient evaluation.     Antimicrobials:  Anti-infectives (From admission, onward)    Start     Dose/Rate Route Frequency Ordered Stop   03/25/24 1800  ceFAZolin  (ANCEF ) IVPB 2g/100 mL premix        2 g 200 mL/hr over 30 Minutes Intravenous Every 8 hours 03/25/24 1152     03/25/24 0800  ceFEPIme (MAXIPIME) 2 g in sodium chloride  0.9 % 100 mL IVPB  Status:  Discontinued        2 g 200 mL/hr over 30 Minutes Intravenous Every 12 hours 03/24/24 2217 03/25/24 1124   03/25/24 0800  vancomycin (VANCOREADY) IVPB 500 mg/100 mL  Status:  Discontinued        500  mg 100 mL/hr over 60 Minutes Intravenous Every 12 hours 03/24/24 2315 03/25/24 1124   03/24/24 1745  ceFEPIme (MAXIPIME) 2 g in sodium chloride  0.9 % 100 mL IVPB        2 g 200 mL/hr over 30 Minutes Intravenous  Once 03/24/24 1743 03/24/24 1955   03/24/24 1745  vancomycin (VANCOCIN) IVPB 1000 mg/200 mL premix        1,000 mg 200 mL/hr over 60 Minutes Intravenous  Once 03/24/24 1743 03/24/24 2046       Subjective: Patient seen and evaluated today with improvement in back pain noted after multiple pain medications provided.  No  further fever has been noted.  Objective: Vitals:   03/26/24 0317 03/26/24 0515 03/26/24 0632 03/26/24 0812  BP: 112/60  (!) 100/59 (!) 95/50  Pulse: 90  70 75  Resp: 18  18 17   Temp: (!) 101.5 F (38.6 C) 99 F (37.2 C) 98.2 F (36.8 C) 98.4 F (36.9 C)  TempSrc: Oral Oral Oral   SpO2: 95%  98% 98%  Weight:      Height:        Intake/Output Summary (Last 24 hours) at 03/26/2024 0945 Last data filed at 03/26/2024 0524 Gross per 24 hour  Intake 920 ml  Output --  Net 920 ml   Filed Weights   03/24/24 1543  Weight: 52 kg    Examination:  General exam: Appears calm and comfortable  Respiratory system: Clear to auscultation. Respiratory effort normal. Cardiovascular system: S1 & S2 heard, RRR.  Gastrointestinal system: Abdomen is soft Central nervous system: Alert and awake Extremities: No edema, low back spinous process tenderness noted to palpation Skin: No significant lesions noted Psychiatry: Flat affect.    Data Reviewed: I have personally reviewed following labs and imaging studies  CBC: Recent Labs  Lab 03/24/24 1622 03/25/24 0338 03/26/24 0449  WBC 12.3* 10.4 10.2  NEUTROABS 8.6*  --   --   HGB 11.0* 10.0* 9.2*  HCT 36.3* 32.8* 29.0*  MCV 86.0 85.2 81.7  PLT 479* 563* 483*   Basic Metabolic Panel: Recent Labs  Lab 03/24/24 1622 03/25/24 0338 03/26/24 0449  NA 135 136 133*  K 4.7 4.1 4.2  CL 96* 99 99  CO2 24 27 24   GLUCOSE 101* 107* 90  BUN 23 23 12   CREATININE 0.99 0.99 0.87  CALCIUM 8.9 8.2* 8.1*  MG  --  2.3 1.9  PHOS  --  2.8  --    GFR: Estimated Creatinine Clearance (by C-G formula based on SCr of 0.87 mg/dL) Male: 47.0 mL/min Male: 62.3 mL/min Liver Function Tests: Recent Labs  Lab 03/24/24 1622  AST 24  ALT 16  ALKPHOS 72  BILITOT 0.6  PROT 9.3*  ALBUMIN 3.2*   No results for input(s): LIPASE, AMYLASE in the last 168 hours. No results for input(s): AMMONIA in the last 168 hours. Coagulation Profile: No  results for input(s): INR, PROTIME in the last 168 hours. Cardiac Enzymes: No results for input(s): CKTOTAL, CKMB, CKMBINDEX, TROPONINI in the last 168 hours. BNP (last 3 results) No results for input(s): PROBNP in the last 8760 hours. HbA1C: Recent Labs    03/26/24 0449  HGBA1C 6.3*   CBG: Recent Labs  Lab 03/25/24 0726 03/25/24 1105 03/25/24 1702 03/25/24 2115 03/26/24 0813  GLUCAP 110* 102* 90 121* 81   Lipid Profile: No results for input(s): CHOL, HDL, LDLCALC, TRIG, CHOLHDL, LDLDIRECT in the last 72 hours. Thyroid Function Tests: No results for input(s):  TSH, T4TOTAL, FREET4, T3FREE, THYROIDAB in the last 72 hours. Anemia Panel: No results for input(s): VITAMINB12, FOLATE, FERRITIN, TIBC, IRON , RETICCTPCT in the last 72 hours. Sepsis Labs: Recent Labs  Lab 03/24/24 1808  LATICACIDVEN 1.0    Recent Results (from the past 240 hours)  Blood culture (routine x 2)     Status: Abnormal (Preliminary result)   Collection Time: 03/24/24  6:06 PM   Specimen: Left Antecubital; Blood  Result Value Ref Range Status   Specimen Description   Final    LEFT ANTECUBITAL Performed at [facility], [address], [place], [place] [address]    Special Requests   Final    AEROBIC BOTTLE ONLY Blood Culture adequate volume Performed at [facility], [address], [place], [place] [address]    Culture  Setup Time   Final    AEROBIC BOTTLE ONLY GRAM POSITIVE COCCI Gram Stain Report Called to,Read Back By and Verified With: S HLADILEK AT 1212 ON 09.30.25 BY ADGER J  Performed at [facility], [address] [place], [place] [address]    Culture STAPHYLOCOCCUS AUREUS (A)  Final   Report Status PENDING  Incomplete  Blood culture (routine x 2)     Status: Abnormal (Preliminary result)   Collection Time: 03/24/24  6:08 PM   Specimen: Left Antecubital; Blood  Result Value Ref Range Status   Specimen Description   Final    LEFT  ANTECUBITAL Performed at [facility], [address], [place], [place] [address]    Special Requests   Final    BOTTLES DRAWN AEROBIC AND ANAEROBIC Blood Culture adequate volume Performed at [facility], [address] [place], [place] [address]    Culture  Setup Time   Final    IN BOTH AEROBIC AND ANAEROBIC BOTTLES GRAM POSITIVE COCCI Gram Stain Report Called to,Read Back By and Verified With: S HLADILEK AT 1212 ON 09.30.25 BY ADGER J  CRITICAL RESULT CALLED TO, READ BACK BY AND VERIFIED WITH: TILDA JUDITHANN KOBS [phone] @ [phone] Performed at [facility], [address] [place], [place] [address]    Culture (A)  Final    STAPHYLOCOCCUS AUREUS SUSCEPTIBILITIES TO FOLLOW Performed at [facility], [address] [place], [place] [address]    Report Status PENDING  Incomplete  Blood Culture ID Panel (Reflexed)     Status: Abnormal   Collection Time: 03/24/24  6:08 PM  Result Value Ref Range Status   Enterococcus faecalis NOT DETECTED NOT DETECTED Final   Enterococcus Faecium NOT DETECTED NOT DETECTED Final   Listeria monocytogenes NOT DETECTED NOT DETECTED Final   Staphylococcus species DETECTED (A) NOT DETECTED Final    Comment: CRITICAL RESULT CALLED TO, READ BACK BY AND VERIFIED WITH: TILDA JUDITHANN KOBS [phone] @ 1712 FH    Staphylococcus aureus (BCID) DETECTED (A) NOT DETECTED Final    Comment: CRITICAL RESULT CALLED TO, READ BACK BY AND VERIFIED WITH: TILDA JUDITHANN KOBS [phone] @ 1712 FH    Staphylococcus epidermidis NOT DETECTED NOT DETECTED Final   Staphylococcus lugdunensis NOT DETECTED NOT DETECTED Final   Streptococcus species NOT DETECTED NOT DETECTED Final   Streptococcus agalactiae NOT DETECTED NOT DETECTED Final   Streptococcus pneumoniae NOT DETECTED NOT DETECTED Final   Streptococcus pyogenes NOT DETECTED NOT DETECTED Final   A.calcoaceticus-baumannii NOT DETECTED NOT DETECTED Final   Bacteroides fragilis NOT DETECTED NOT DETECTED Final   Enterobacterales NOT  DETECTED NOT DETECTED Final   Enterobacter cloacae complex NOT DETECTED NOT DETECTED  Final   Escherichia coli NOT DETECTED NOT DETECTED Final   Klebsiella aerogenes NOT DETECTED NOT DETECTED Final   Klebsiella oxytoca NOT DETECTED NOT DETECTED Final   Klebsiella pneumoniae NOT DETECTED NOT DETECTED Final   Proteus species NOT DETECTED NOT DETECTED Final   Salmonella species NOT DETECTED NOT DETECTED Final   Serratia marcescens NOT DETECTED NOT DETECTED Final   Haemophilus influenzae NOT DETECTED NOT DETECTED Final   Neisseria meningitidis NOT DETECTED NOT DETECTED Final   Pseudomonas aeruginosa NOT DETECTED NOT DETECTED Final   Stenotrophomonas maltophilia NOT DETECTED NOT DETECTED Final   Candida albicans NOT DETECTED NOT DETECTED Final   Candida auris NOT DETECTED NOT DETECTED Final   Candida glabrata NOT DETECTED NOT DETECTED Final   Candida krusei NOT DETECTED NOT DETECTED Final   Candida parapsilosis NOT DETECTED NOT DETECTED Final   Candida tropicalis NOT DETECTED NOT DETECTED Final   Cryptococcus neoformans/gattii NOT DETECTED NOT DETECTED Final   Meth resistant mecA/C and MREJ NOT DETECTED NOT DETECTED Final    Comment: Performed at [facility], [address] [place], [place] [address]  Resp panel by RT-PCR (RSV, Flu A&B, Covid) Anterior Nasal Swab     Status: None   Collection Time: 03/24/24  6:28 PM   Specimen: Anterior Nasal Swab  Result Value Ref Range Status   SARS Coronavirus 2 by RT PCR NEGATIVE NEGATIVE Final    Comment: (NOTE) SARS-CoV-2 target nucleic acids are NOT DETECTED.  The SARS-CoV-2 RNA is generally detectable in upper respiratory specimens during the acute phase of infection. The lowest concentration of SARS-CoV-2 viral copies this assay can detect is 138 copies/mL. A negative result does not preclude SARS-Cov-2 infection and should not be used as the sole basis for treatment or other patient management decisions. A negative result may occur with   improper specimen collection/handling, submission of specimen other than nasopharyngeal swab, presence of viral mutation(s) within the areas targeted by this assay, and inadequate number of viral copies(<138 copies/mL). A negative result must be combined with clinical observations, patient history, and epidemiological information. The expected result is Negative.  Fact Sheet for Patients:  BloggerCourse.com  Fact Sheet for Healthcare Providers:  SeriousBroker.it  This test is no t yet approved or cleared by the [place]  FDA and  has been authorized for detection and/or diagnosis of SARS-CoV-2 by FDA under an Emergency Use Authorization (EUA). This EUA will remain  in effect (meaning this test can be used) for the duration of the COVID-19 declaration under Section 564(b)(1) of the Act, 21 U.S.C.section 360bbb-3(b)(1), unless the authorization is terminated  or revoked sooner.       Influenza A by PCR NEGATIVE NEGATIVE Final   Influenza B by PCR NEGATIVE NEGATIVE Final    Comment: (NOTE) The Xpert Xpress SARS-CoV-2/FLU/RSV plus assay is intended as an aid in the diagnosis of influenza from Nasopharyngeal swab specimens and should not be used as a sole basis for treatment. Nasal washings and aspirates are unacceptable for Xpert Xpress SARS-CoV-2/FLU/RSV testing.  Fact Sheet for Patients: BloggerCourse.com  Fact Sheet for Healthcare Providers: SeriousBroker.it  This test is not yet approved or cleared by the [place]  FDA and has been authorized for detection and/or diagnosis of SARS-CoV-2 by FDA under an Emergency Use Authorization (EUA). This EUA will remain in effect (meaning this test can be used) for the duration of the COVID-19 declaration under Section 564(b)(1) of the Act, 21 U.S.C. section 360bbb-3(b)(1), unless the authorization is terminated or revoked.  Resp Syncytial Virus by PCR NEGATIVE NEGATIVE Final    Comment: (NOTE) Fact Sheet for Patients: BloggerCourse.com  Fact Sheet for Healthcare Providers: SeriousBroker.it  This test is not yet approved or cleared by the [place]  FDA and has been authorized for detection and/or diagnosis of SARS-CoV-2 by FDA under an Emergency Use Authorization (EUA). This EUA will remain in effect (meaning this test can be used) for the duration of the COVID-19 declaration under Section 564(b)(1) of the Act, 21 U.S.C. section 360bbb-3(b)(1), unless the authorization is terminated or revoked.  Performed at [facility], [address] [place], [place] [address]          Radiology Studies: ECHOCARDIOGRAM COMPLETE Result Date: 03/25/2024    ECHOCARDIOGRAM REPORT   Patient Name:   NIVAAN DICENZO [facility] Date of Exam: 03/25/2024 Medical Rec #:  [id number]       Height:       68.0 in Accession #:    [id number]      Weight:       114.6 lb Date of Birth:  02-23-1961       BSA:          1.613 m Patient Age:    63 years        BP:           124/70 mmHg Patient Gender: M               HR:           88 bpm. Exam Location:  [facility] Procedure: 2D Echo, Cardiac Doppler and Color Doppler (Both Spectral and Color            Flow Doppler were utilized during procedure). Indications:    Endocarditis  History:        Patient has no prior history of Echocardiogram examinations.                 COPD; Risk Factors:Diabetes.  Sonographer:    Carmelita Hartshorn RDCS, FE, PE Referring Phys: [id number] JONATHAN SEGARS IMPRESSIONS  1. Left ventricular ejection fraction, by estimation, is 60 to 65%. The left ventricle has normal function. The left ventricle has no regional wall motion abnormalities. Left ventricular diastolic parameters are consistent with Grade I diastolic dysfunction (impaired relaxation).  2. Right ventricular systolic function is normal. The right ventricular size is normal.  There is normal pulmonary artery systolic pressure.  3. The mitral valve is normal in structure. Trivial mitral valve regurgitation. No evidence of mitral stenosis.  4. The aortic valve is tricuspid. Aortic valve regurgitation is not visualized. No aortic stenosis is present.  5. The inferior vena cava is normal in size with greater than 50% respiratory variability, suggesting right atrial pressure of 3 mmHg. Comparison(s): No prior Echocardiogram. Conclusion(s)/Recommendation(s): No evidence of valvular vegetations on this transthoracic echocardiogram. Consider a transesophageal echocardiogram to exclude infective endocarditis if clinically indicated. FINDINGS  Left Ventricle: Left ventricular ejection fraction, by estimation, is 60 to 65%. The left ventricle has normal function. The left ventricle has no regional wall motion abnormalities. Strain was performed and the global longitudinal strain is indeterminate. The left ventricular internal cavity size was normal in size. There is no left ventricular hypertrophy. Left ventricular diastolic parameters are consistent with Grade I diastolic dysfunction (impaired relaxation). Normal left ventricular filling pressure. Right Ventricle: The right ventricular size is normal. No increase in right ventricular wall thickness. Right ventricular systolic function is normal. There is normal pulmonary artery systolic pressure. The tricuspid regurgitant  velocity is 2.57 m/s, and  with an assumed right atrial pressure of 3 mmHg, the estimated right ventricular systolic pressure is 29.4 mmHg. Left Atrium: Left atrial size was normal in size. Right Atrium: Right atrial size was normal in size. Pericardium: There is no evidence of pericardial effusion. Mitral Valve: The mitral valve is normal in structure. Trivial mitral valve regurgitation. No evidence of mitral valve stenosis. Tricuspid Valve: The tricuspid valve is normal in structure. Tricuspid valve regurgitation is trivial. No  evidence of tricuspid stenosis. Aortic Valve: The aortic valve is tricuspid. Aortic valve regurgitation is not visualized. No aortic stenosis is present. Pulmonic Valve: The pulmonic valve was not well visualized. Pulmonic valve regurgitation is not visualized. No evidence of pulmonic stenosis. Aorta: The aortic root is normal in size and structure. Venous: The inferior vena cava is normal in size with greater than 50% respiratory variability, suggesting right atrial pressure of 3 mmHg. IAS/Shunts: No atrial level shunt detected by color flow Doppler. Additional Comments: 3D was performed not requiring image post processing on an independent workstation and was indeterminate.  LEFT VENTRICLE PLAX 2D LVIDd:         4.30 cm   Diastology LVIDs:         2.80 cm   LV e' medial:    6.85 cm/s LV PW:         1.00 cm   LV E/e' medial:  8.4 LV IVS:        1.00 cm   LV e' lateral:   10.70 cm/s LVOT diam:     2.10 cm   LV E/e' lateral: 5.4 LV SV:         60 LV SV Index:   37 LVOT Area:     3.46 cm  RIGHT VENTRICLE RV Basal diam:  2.40 cm RV Mid diam:    2.10 cm RV S prime:     9.25 cm/s TAPSE (M-mode): 1.7 cm LEFT ATRIUM             Index        RIGHT ATRIUM           Index LA diam:        3.00 cm 1.86 cm/m   RA Area:     12.80 cm LA Vol (A2C):   28.2 ml 17.48 ml/m  RA Volume:   29.50 ml  18.29 ml/m LA Vol (A4C):   36.1 ml 22.38 ml/m LA Biplane Vol: 32.8 ml 20.33 ml/m  AORTIC VALVE LVOT Vmax:   103.00 cm/s LVOT Vmean:  67.000 cm/s LVOT VTI:    0.174 m  AORTA Ao Root diam: 3.10 cm MITRAL VALVE               TRICUSPID VALVE MV Area (PHT): 3.99 cm    TR Peak grad:   26.4 mmHg MV Decel Time: 190 msec    TR Vmax:        257.00 cm/s MV E velocity: 57.50 cm/s MV A velocity: 87.80 cm/s  SHUNTS MV E/A ratio:  0.65        Systemic VTI:  0.17 m                            Systemic Diam: 2.10 cm Vishnu Priya Mallipeddi Electronically signed by Diannah Late Mallipeddi Signature Date/Time: 03/25/2024/12:15:11 PM    Final    CT CHEST  WO CONTRAST Result Date: 03/24/2024 EXAM: CT CHEST WITHOUT CONTRAST  03/24/2024 11:03:11 PM TECHNIQUE: CT of the chest was performed without the administration of intravenous contrast. Multiplanar reformatted images are provided for review. Automated exposure control, iterative reconstruction, and/or weight based adjustment of the mA/kV was utilized to reduce the radiation dose to as low as reasonably achievable. COMPARISON: Comparison with chest radiograph 03/24/2024 and CT chest 09/23/2021 acceptable. CLINICAL HISTORY: Eval for pulm septic emboli, suspect bacteremia + metastatic infection. recent RLL pulm nodule. Evaluate for metastatic infection, recent RLL pulmonary nodule. FINDINGS: MEDIASTINUM: Heart and pericardium are unremarkable. The central airways are clear. LYMPH NODES: Perifissural lymph nodes are unchanged compared to 09/23/2021. No mediastinal or axillary lymphadenopathy. LUNGS AND PLEURA: Upper lobe predominant emphysema. Small left and trace right pleural effusions. Scarring in the lower lobes. The semisolid nodule in the lateral right lower lobe is more confluent today compared to 03/06/2024 and measures 2.0 x 1.1 cm (series 3 image 114). No pulmonary edema. No pneumothorax. SOFT TISSUES/BONES: No acute abnormality of the bones or soft tissues. UPPER ABDOMEN: Limited images of the upper abdomen demonstrates no acute abnormality. IMPRESSION: 1. Confluent subpleural nodule in the lateral right lower lobe in the area of the previously seen subsolid nodule, recommend follow-up CT in 3 months. 2. Small left and trace right pleural effusions. Electronically signed by: Norman Gatlin MD 03/24/2024 11:22 PM EDT RP Workstation: [id number]   MR Lumbar Spine W Wo Contrast Result Date: 03/24/2024 CLINICAL DATA:  Initial evaluation for acute back pain, fever. EXAM: MRI LUMBAR SPINE WITHOUT AND WITH CONTRAST TECHNIQUE: Multiplanar and multiecho pulse sequences of the lumbar spine were obtained without and  with intravenous contrast. CONTRAST:  5mL GADAVIST GADOBUTROL 1 MMOL/ML IV SOLN COMPARISON:  Radiograph from 03/06/2024 FINDINGS: Segmentation: Standard. Lowest well-formed disc space labeled the L5-S1 level. Alignment: Moderate dextroscoliosis, apex at L2-3. No significant listhesis. Vertebrae: Vertebral body height maintained without acute or chronic fracture. Bone marrow signal intensity diffusely heterogeneous. No worrisome osseous lesions. There is abnormal marrow edema involving the L1 vertebral body. Hazy inflammatory changes within the adjacent paraspinous soft tissues. Findings raise the concern for acute osteomyelitis at this level. Mild enhancement about the inferior endplate of T12, but with no other overt evidence for intervening discitis at this time. No epidural involvement. 1.6 cm area along the left anterolateral margin of the T12-L1 disc space could reflect a small abscess versus extruded disc material (series 16, image 4). No other discrete abscess or drainable fluid collection. No other evidence for acute infection elsewhere within the lumbar spine. Conus medullaris and cauda equina: Conus extends to the L1 level. Conus and cauda equina appear normal. Paraspinal and other soft tissues: Paraspinous inflammation adjacent to T12-L1 as above. Paraspinous soft tissues demonstrate no other acute finding. Disc levels: L1-2: Small chronic endplate Schmorl's node deformity. Otherwise negative interspace. Mild facet hypertrophy. No stenosis. L2-3: Disc desiccation with minimal disc bulge. Mild facet hypertrophy. No spinal stenosis. Foramina remain patent. L3-4: Negative interspace. Mild facet hypertrophy. No significant spinal stenosis. Foramina remain patent. L4-5: Degenerate intervertebral disc space narrowing with disc desiccation diffuse disc bulge. Reactive endplate spurring. Mild facet and ligament flavum hypertrophy. No significant spinal stenosis. Moderate bilateral L4 foraminal narrowing. L5-S1:  Degenerative intervertebral disc space narrowing with disc desiccation diffuse disc bulge. Reactive endplate spurring. Moderate right worse than left facet hypertrophy. No spinal stenosis. Moderate bilateral L5 foraminal narrowing. IMPRESSION: 1. Abnormal marrow edema involving the L1 vertebral body with adjacent paraspinous could reflect extruded disc material versus a small abscess. No epidural involvement. 2. No other  evidence for acute infection elsewhere within the lumbar spine. 3. Degenerative spondylosis at L4-5 and L5-S1 with resultant moderate bilateral L4 and L5 foraminal stenosis. Electronically Signed   By: Morene Hoard M.D.   On: 03/24/2024 19:29   DG Chest 2 View Result Date: 03/24/2024 CLINICAL DATA:  Back pain history of lung mass EXAM: CHEST - 2 VIEW COMPARISON:  Chest x-ray 09/08/2021, CT 03/06/2024 FINDINGS: The heart size and mediastinal contours are within normal limits. Both lungs are clear. The visualized skeletal structures are unremarkable. Sub solid right lung base density on CT is not well seen radiographically IMPRESSION: No active cardiopulmonary disease. Electronically Signed   By: Luke Bun M.D.   On: 03/24/2024 16:19        Scheduled Meds:  enoxaparin  (LOVENOX ) injection  40 mg Subcutaneous QHS   feeding supplement  237 mL Oral BID BM   folic acid  1 mg Oral Daily   insulin aspart  0-6 Units Subcutaneous TID WC   multivitamin with minerals  1 tablet Oral Daily   sodium chloride  flush  3 mL Intravenous Q12H   thiamine  100 mg Oral Daily   Or   thiamine  100 mg Intravenous Daily   Continuous Infusions:   ceFAZolin  (ANCEF ) IV 2 g (03/26/24 0524)     LOS: 2 days    Time spent: 35    Tracy Kinner, DO [facility]  If 7PM-7AM, please contact night-coverage [email or web address] 03/26/2024, 9:45 AM

## 2024-03-26 NOTE — Progress Notes (Signed)
 We were asked to see patient to evaluate dysphagia prior to having TEE.  Will see him in the a.m. 03/27/2024.  Please keep n.p.o. after midnight tonight.  I have stopped subcu heparin after tonight's dose just in case he gets a procedure tomorrow.  Will see patient in the morning

## 2024-03-26 NOTE — Progress Notes (Signed)
 Pt requested to get Hep vaccine tonight. Does not want any injection right now. Pt states he just wants to sleep.

## 2024-03-26 NOTE — Plan of Care (Signed)

## 2024-03-26 NOTE — Consult Note (Signed)
 [facility]    Date of Admission:  03/24/2024     Total days of antibiotics 2   Cefazolin    Vanc + Cefepime x 1 dose              Reason for Consult: MSSA Bacteremia / discitis     Referring Provider: Sigdel Primary Care Provider: No primary care provider on file.    Assessment: Gregory Camacho is a 63 y.o. adult admitted from [facility] with:   MSSA Bacteremia -  L1 Osteomyelitis -  1 month history of back pain and fevers that are escalating. No history of IVDU, no wounds or breaks in skin noted. He has been managing pain with smoking crack. He is feeling better however has some cspine tenderness over vertebra. Will image here as well to ensure no multileval spine involvement.  Given 23m process described will go ahead with TEE to ensure no endocarditis complication here as well.  Repeat blood cultures tomorrow AM.  We don't need to pursue back aspiration with positive blood cultures.  Continue cefazolin  monotherapy  Order C spine MRI given tenderness described at lower C spine levels. (Nothing like the L spine pain, but concerning).   Substance Use -  Crack use. No injection use. Positive for opiates but had Rx for this  Hepatitis Screenings and HIV screenings are all non-reactive.  He could use a hepatitis B booster vaccine series. We should give him first dose while he is here.   T2DM -  Well controlled  Managed by primary team  Lab Results  Component Value Date   HGBA1C 6.3 (H) 03/26/2024     Plan: MRI w/ and w/o C spine  TEE (cards consulted by me already)  Repeat blood cultures in AM Continue cefazolin      Principal Problem:   MSSA bacteremia Active Problems:   COPD GOLD 2   Acute osteomyelitis of lumbar spine (HCC)    enoxaparin  (LOVENOX ) injection  40 mg Subcutaneous QHS   feeding supplement  237 mL Oral BID BM   folic acid  1 mg Oral Daily   hepatitis b vaccine  0.5 mL Intramuscular Once   insulin aspart  0-6  Units Subcutaneous TID WC   multivitamin with minerals  1 tablet Oral Daily   sodium chloride  flush  3 mL Intravenous Q12H   thiamine  100 mg Oral Daily   Or   thiamine  100 mg Intravenous Daily    HPI: Gregory Camacho is a 63 y.o. adult admitted [facility] for management of vertebral infection and MSSA bacteremia   He admits to smoking crack cocaine to help with pain management.  Never injection drug use. Lives in [place].   Had Urgent Care visit on 9.11 for lower back pain that started 4 days prior. No numbness or bladder/bowel incontinence. Thought to be related to UTI with leukocytosis on CBC and complaint of fevers with pyuria. Lspine XRay was normal at that time. Urine cultures grew out MSSA.   This worsened and he came back to [facility] ER for evaluation. Midline tenderness at L3-LL5. MRI obtained with abnormal marrow edema involving the L1 vertebral body with adjacent paraspinous could reflect extruded disc material versus a small abscess. No epidural involvement. He states he feels much improved overall. He is awaiting his daughter to come visit.    Review of Systems: Review of Systems  Constitutional:  Positive for chills, diaphoresis and  fever.  Respiratory: Negative.    Cardiovascular: Negative.   Musculoskeletal:  Positive for back pain and neck pain.  Skin:  Negative for itching and rash.  Neurological:  Negative for tingling, sensory change, focal weakness and weakness.     Past Medical History:  Diagnosis Date   COPD (chronic obstructive pulmonary disease) (HCC)    Depression    Sinus congestion     Social History   Tobacco Use   Smoking status: Former    Current packs/day: 0.00    Average packs/day: 1 pack/day for 30.0 years (30.0 ttl pk-yrs)    Types: Cigarettes    Start date: 05/1991    Quit date: 05/2021    Years since quitting: 2.8   Smokeless tobacco: Never  Vaping Use   Vaping status: Never Used  Substance Use Topics   Alcohol use: Yes     Alcohol/week: 4.0 standard drinks of alcohol    Types: 4 Cans of beer per week    Comment: occa   Drug use: Yes    Types: Marijuana, Cocaine    Comment: occassionally (last 05/23/18)    Family History  Problem Relation Age of Onset   Hypertension Mother    Colon cancer Neg Hx    Allergies  Allergen Reactions   Bc Fast Pain Relief [Aspirin -Salicylamide-Caffeine] Other (See Comments)    Upset stomach    OBJECTIVE: Blood pressure (!) 95/50, pulse 75, temperature 98.4 F (36.9 C), resp. rate 17, height 5' 8 (1.727 m), weight 52 kg, SpO2 98%.  Physical Exam Cardiovascular:     Rate and Rhythm: Normal rate and regular rhythm.     Heart sounds: No murmur heard. Pulmonary:     Effort: Pulmonary effort is normal.     Breath sounds: Normal breath sounds.  Abdominal:     General: Bowel sounds are normal.     Tenderness: There is no abdominal tenderness.  Musculoskeletal:       Arms:     Cervical back: Tenderness present. No rigidity.     Comments: Tenderness over spine     Lab Results Lab Results  Component Value Date   WBC 10.2 03/26/2024   HGB 9.2 (L) 03/26/2024   HCT 29.0 (L) 03/26/2024   MCV 81.7 03/26/2024   PLT 483 (H) 03/26/2024    Lab Results  Component Value Date   CREATININE 0.87 03/26/2024   BUN 12 03/26/2024   NA 133 (L) 03/26/2024   K 4.2 03/26/2024   CL 99 03/26/2024   CO2 24 03/26/2024    Lab Results  Component Value Date   ALT 16 03/24/2024   AST 24 03/24/2024   ALKPHOS 72 03/24/2024   BILITOT 0.6 03/24/2024     Microbiology: Recent Results (from the past 240 hours)  Blood culture (routine x 2)     Status: Abnormal (Preliminary result)   Collection Time: 03/24/24  6:06 PM   Specimen: Left Antecubital; Blood  Result Value Ref Range Status   Specimen Description   Final    LEFT ANTECUBITAL Performed at [facility], [address], [place], [place] [address]    Special Requests   Final    AEROBIC BOTTLE ONLY Blood Culture adequate  volume Performed at [facility], [address], [place], [place] [address]    Culture  Setup Time   Final    AEROBIC BOTTLE ONLY GRAM POSITIVE COCCI Gram Stain Report Called to,Read Back By and Verified With: S HLADILEK AT 1212 ON 09.30.25 BY ADGER  J  Performed at [facility], [address], [place], [place] [address]    Culture STAPHYLOCOCCUS AUREUS (A)  Final   Report Status PENDING  Incomplete  Blood culture (routine x 2)     Status: Abnormal (Preliminary result)   Collection Time: 03/24/24  6:08 PM   Specimen: Left Antecubital; Blood  Result Value Ref Range Status   Specimen Description   Final    LEFT ANTECUBITAL Performed at [facility], [address], [place], [place] [address]    Special Requests   Final    BOTTLES DRAWN AEROBIC AND ANAEROBIC Blood Culture adequate volume Performed at [facility], [address] [place], [place] [address]    Culture  Setup Time   Final    IN BOTH AEROBIC AND ANAEROBIC BOTTLES GRAM POSITIVE COCCI Gram Stain Report Called to,Read Back By and Verified With: S HLADILEK AT 1212 ON 09.30.25 BY ADGER J  CRITICAL RESULT CALLED TO, READ BACK BY AND VERIFIED WITH: TILDA JUDITHANN KOBS [phone] @ [phone] FH Performed at [facility], [address] [place], [place] [address]    Culture (A)  Final    STAPHYLOCOCCUS AUREUS SUSCEPTIBILITIES TO FOLLOW Performed at [facility], [address] [place], [place] [address]    Report Status PENDING  Incomplete  Blood Culture ID Panel (Reflexed)     Status: Abnormal   Collection Time: 03/24/24  6:08 PM  Result Value Ref Range Status   Enterococcus faecalis NOT DETECTED NOT DETECTED Final   Enterococcus Faecium NOT DETECTED NOT DETECTED Final   Listeria monocytogenes NOT DETECTED NOT DETECTED Final   Staphylococcus species DETECTED (A) NOT DETECTED Final    Comment: CRITICAL RESULT CALLED TO, READ BACK BY AND VERIFIED WITH: TILDA JUDITHANN KOBS [phone] @ 1712 FH    Staphylococcus aureus (BCID) DETECTED (A)  NOT DETECTED Final    Comment: CRITICAL RESULT CALLED TO, READ BACK BY AND VERIFIED WITH: TILDA JUDITHANN KOBS [phone] @ 1712 FH    Staphylococcus epidermidis NOT DETECTED NOT DETECTED Final   Staphylococcus lugdunensis NOT DETECTED NOT DETECTED Final   Streptococcus species NOT DETECTED NOT DETECTED Final   Streptococcus agalactiae NOT DETECTED NOT DETECTED Final   Streptococcus pneumoniae NOT DETECTED NOT DETECTED Final   Streptococcus pyogenes NOT DETECTED NOT DETECTED Final   A.calcoaceticus-baumannii NOT DETECTED NOT DETECTED Final   Bacteroides fragilis NOT DETECTED NOT DETECTED Final   Enterobacterales NOT DETECTED NOT DETECTED Final   Enterobacter cloacae complex NOT DETECTED NOT DETECTED Final   Escherichia coli NOT DETECTED NOT DETECTED Final   Klebsiella aerogenes NOT DETECTED NOT DETECTED Final   Klebsiella oxytoca NOT DETECTED NOT DETECTED Final   Klebsiella pneumoniae NOT DETECTED NOT DETECTED Final   Proteus species NOT DETECTED NOT DETECTED Final   Salmonella species NOT DETECTED NOT DETECTED Final   Serratia marcescens NOT DETECTED NOT DETECTED Final   Haemophilus influenzae NOT DETECTED NOT DETECTED Final   Neisseria meningitidis NOT DETECTED NOT DETECTED Final   Pseudomonas aeruginosa NOT DETECTED NOT DETECTED Final   Stenotrophomonas maltophilia NOT DETECTED NOT DETECTED Final   Candida albicans NOT DETECTED NOT DETECTED Final   Candida auris NOT DETECTED NOT DETECTED Final   Candida glabrata NOT DETECTED NOT DETECTED Final   Candida krusei NOT DETECTED NOT DETECTED Final   Candida parapsilosis NOT DETECTED NOT DETECTED Final   Candida tropicalis NOT DETECTED NOT DETECTED Final   Cryptococcus neoformans/gattii NOT DETECTED NOT DETECTED Final   Meth resistant mecA/C and MREJ NOT DETECTED  NOT DETECTED Final    Comment: Performed at [facility], [address] [place], [place] [address]  Resp panel by RT-PCR (RSV, Flu A&B, Covid) Anterior Nasal Swab     Status:  None   Collection Time: 03/24/24  6:28 PM   Specimen: Anterior Nasal Swab  Result Value Ref Range Status   SARS Coronavirus 2 by RT PCR NEGATIVE NEGATIVE Final    Comment: (NOTE) SARS-CoV-2 target nucleic acids are NOT DETECTED.  The SARS-CoV-2 RNA is generally detectable in upper respiratory specimens during the acute phase of infection. The lowest concentration of SARS-CoV-2 viral copies this assay can detect is 138 copies/mL. A negative result does not preclude SARS-Cov-2 infection and should not be used as the sole basis for treatment or other patient management decisions. A negative result may occur with  improper specimen collection/handling, submission of specimen other than nasopharyngeal swab, presence of viral mutation(s) within the areas targeted by this assay, and inadequate number of viral copies(<138 copies/mL). A negative result must be combined with clinical observations, patient history, and epidemiological information. The expected result is Negative.  Fact Sheet for Patients:  BloggerCourse.com  Fact Sheet for Healthcare Providers:  SeriousBroker.it  This test is no t yet approved or cleared by the [place]  FDA and  has been authorized for detection and/or diagnosis of SARS-CoV-2 by FDA under an Emergency Use Authorization (EUA). This EUA will remain  in effect (meaning this test can be used) for the duration of the COVID-19 declaration under Section 564(b)(1) of the Act, 21 U.S.C.section 360bbb-3(b)(1), unless the authorization is terminated  or revoked sooner.       Influenza A by PCR NEGATIVE NEGATIVE Final   Influenza B by PCR NEGATIVE NEGATIVE Final    Comment: (NOTE) The Xpert Xpress SARS-CoV-2/FLU/RSV plus assay is intended as an aid in the diagnosis of influenza from Nasopharyngeal swab specimens and should not be used as a sole basis for treatment. Nasal washings and aspirates are unacceptable  for Xpert Xpress SARS-CoV-2/FLU/RSV testing.  Fact Sheet for Patients: BloggerCourse.com  Fact Sheet for Healthcare Providers: SeriousBroker.it  This test is not yet approved or cleared by the [place]  FDA and has been authorized for detection and/or diagnosis of SARS-CoV-2 by FDA under an Emergency Use Authorization (EUA). This EUA will remain in effect (meaning this test can be used) for the duration of the COVID-19 declaration under Section 564(b)(1) of the Act, 21 U.S.C. section 360bbb-3(b)(1), unless the authorization is terminated or revoked.     Resp Syncytial Virus by PCR NEGATIVE NEGATIVE Final    Comment: (NOTE) Fact Sheet for Patients: BloggerCourse.com  Fact Sheet for Healthcare Providers: SeriousBroker.it  This test is not yet approved or cleared by the [place]  FDA and has been authorized for detection and/or diagnosis of SARS-CoV-2 by FDA under an Emergency Use Authorization (EUA). This EUA will remain in effect (meaning this test can be used) for the duration of the COVID-19 declaration under Section 564(b)(1) of the Act, 21 U.S.C. section 360bbb-3(b)(1), unless the authorization is terminated or revoked.  Performed at [facility], [address] [place], [place] [address]   Urine Culture     Status: None   Collection Time: 03/25/24  6:40 AM   Specimen: Urine, Random  Result Value Ref Range Status   Specimen Description   Final    URINE, RANDOM Performed at [facility], [address], [place], [place] [address]    Special Requests   Final  NONE Reflexed from [id number] Performed at [facility], [address] [place], [place] [address]    Culture   Final    NO GROWTH Performed at [facility], [address], [place], [place] [address]    Report Status 03/26/2024 FINAL  Final    [name], MSN, NP-C [facility] Pager: [phone]  03/26/2024 3:54 PM    Total Encounter Time: 18 m

## 2024-03-26 NOTE — Progress Notes (Signed)
{  Important!!  Must Read All  Red and Blue text will automatically delete when note signed   Document Absolute and/or Relative Contraindications    Absolute - Perforated viscus, Esophageal stricture, Esophageal tumor, Esophageal perforation, laceration, Esophageal diverticulum, Active upper GI bleed Relative - History of radiation to neck and mediastinum, History of GI surgery, Recent upper GI bleed, Barrett's esophagus, History of dysphagia, Restriction of neck mobility (severe cervical arthritis, atlantoaxial joint disease), Symptomatic hiatal hernia, Esophageal varices, Active esophagitis, Active peptic ulcer disease    Document other considerations   If the pt has a coagulopathy with INR over 4 or Plt count less than 50K, is currently on O2, has a hx of OSA, reduced EF or severe valve stenosis or regurgitation  If pt also needs DCCV   Document if the patient has been appropriately anticoagulated if they are to have a DCCV:1} [facility] has been requested to perform a transesophageal echocardiogram on Gregory Camacho for ***.    {Does the patient have any absolute or relative contraindications to a TEE?:[phone]}  {The patient yjd:[id number]}  {Select ONLY if also doing a cardioversion (Optional):[phone]}  {Agrees, Declines or Contraindication?:[id number]}  Signed, Leontine LOISE Salen, PA-C  03/26/2024 3:50 PM

## 2024-03-27 ENCOUNTER — Inpatient Hospital Stay (HOSPITAL_COMMUNITY)

## 2024-03-27 ENCOUNTER — Other Ambulatory Visit (HOSPITAL_COMMUNITY)

## 2024-03-27 ENCOUNTER — Encounter (HOSPITAL_COMMUNITY): Payer: Self-pay | Admitting: Radiology

## 2024-03-27 DIAGNOSIS — B9561 Methicillin susceptible Staphylococcus aureus infection as the cause of diseases classified elsewhere: Secondary | ICD-10-CM | POA: Diagnosis not present

## 2024-03-27 DIAGNOSIS — R7881 Bacteremia: Secondary | ICD-10-CM | POA: Diagnosis not present

## 2024-03-27 LAB — GLUCOSE, CAPILLARY
Glucose-Capillary: 115 mg/dL — ABNORMAL HIGH (ref 70–99)
Glucose-Capillary: 98 mg/dL (ref 70–99)
Glucose-Capillary: 143 mg/dL — ABNORMAL HIGH (ref 70–99)
Glucose-Capillary: 112 mg/dL — ABNORMAL HIGH (ref 70–99)

## 2024-03-27 LAB — CULTURE, BLOOD (ROUTINE X 2)
Special Requests: ADEQUATE
Special Requests: ADEQUATE

## 2024-03-27 MED ORDER — GADOBUTROL 1 MMOL/ML IV SOLN
6.0000 mL | Freq: Once | INTRAVENOUS | Status: AC | PRN
  Administered 2024-03-27: 6 mL via INTRAVENOUS

## 2024-03-27 NOTE — Plan of Care (Signed)

## 2024-03-27 NOTE — Progress Notes (Signed)
 [facility] Progress Note Patient: Gregory Camacho FMW:[id number] DOB: Apr 12, 1961  DOA: 03/24/2024 DOS: the patient was seen and examined on 03/27/2024  Brief Hospital Course: Gregory Camacho is a 63 y.o. male with hx of COPD, DM type 2, substance use disorder (cocaine, marijuana, tobacco), hx of persistent leukocytosis / thrombocytosis, suspected reactive, IDA, mood d/o, who presented with worsening back pain. Unclear when began, but has worsened since around time of last ED visit on 9/11.  Patient has been admitted with concerns for L1 discitis/osteomyelitis and possible abscess.  There is concern for MSSA bacteremia with hematogenous spread given that he recently had urinary tract infection which was positive for MSSA.  MRI of the spine is concerning for L1 discitis/osteomyelitis.  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 Presented with complaints of back pain and fever. Initially presented in ED on 9/11 for back pain. He had a urine culture which was positive for Staph aureus.  Had a CT stone study which was negative for any renal abnormality. No history of IV drug abuse. Due to fever and leukocytosis, initiated on IV fluid.  Blood cultures were performed.  MRI showed possible L1 osteomyelitis. ED provider discussed with neurosurgery who does not feel that the patient has any operative intervention indication. Admitted to the hospital for further workup.  Transferred to [facility] from any pain for TEE and ID consultation. Echocardiogram shows normal valves without any vegetation. No significant regurgitation. TEE was scheduled on 10/2 but now scheduled on 10/3.  Polysubstance abuse. Patient is a former smoker quit smoking recently and also abuses cocaine. Denies any IV drug use. Monitor for withdrawals. Counseled to quit abusing substance and smoking.   Patient currently agreeable.  History of COPD. No evidence of exacerbation right now. Continue as needed treatments.  Underweight. Failure to thrive. Body mass index is 17.43 kg/m.  Placing the patient at high risk for poor outcome.  Right lower lobe pulmonary nodule. 2 cm x 1.1 cm. Repeat CT scan in 3 months.   Concern for dysphagia. Currently ruled out. Unsure about the origin for this concern although patient denies clearly that he does not have any history of difficulty swallowing. GI was consulted but currently has signed off. At present recommended no intervention.  Subjective: Denies any acute complaint.  No nausea no vomiting.  Pain is well-controlled.  No numbness no tingling no loss of control of bowel bladder.  Physical Exam: Clear to auscultation. S1-S2 present Bowel sound present No edema Bilateral equal motor strength Equal sensation. No asterixis. No pronator Drift.  Data Reviewed: I have Reviewed nursing notes, Vitals, and Lab results. Since last encounter, pertinent lab results CBC and BMP   . I have ordered test including CBC and BMP  . I have discussed pt's care plan and test results with GI  .   Disposition: Status is: Inpatient Remains inpatient appropriate because: Monitor for improvement in blood cultures and further workup  Place and maintain sequential compression device Start: 03/27/24 1346   Family Communication: Family at bedside Level of care: Telemetry Medical   Vitals:   03/26/24 2357 03/27/24 0500 03/27/24 0812 03/27/24 1549  BP: (!) 91/53 119/70 128/75 112/71  Pulse: 94 89 84 92  Resp: 19 18    Temp: 98.7 F (37.1 C) 99 F (37.2 C) 98.7 F (37.1 C)   TempSrc: Oral Oral Oral   SpO2: 100% 99% 96%  100%  Weight:      Height:         Author: Yetta Blanch, MD 03/27/2024 5:09 PM  Please look on [email or web address] to find out who is on call.

## 2024-03-27 NOTE — Plan of Care (Signed)

## 2024-03-27 NOTE — Consult Note (Incomplete)
 Consultation Note   Referring Provider:    PCP: No primary care provider on file. Primary Gastroenterologist:       Reason for Consultation:  DOA: 03/24/2024         Hospital Day: 4   ASSESSMENT     Chronic anemia   See PMH for any additional medical history  / medical problems  Principal Problem:   MSSA bacteremia Active Problems:   COPD GOLD 2   Acute osteomyelitis of lumbar spine (HCC)    PLAN:   ***    HPI   Patient admitted with back pain , fevers, L1 osteomyelitis. Found to have MSSA bacteremia.  He does not use IV drugs.  Pplan was for TEE but patient complained of dysphagia so that was put on hold until GI can evaluate.      Admission labs notable for: -WBC 12.3 -Hemoglobin 11 (had baseline) -Platelets 483 -Albumin 3.2 -Total protein elevated 9.3 -Chest CT- confluent subpleural nodule in the lateral right lower lobe in the area of the previously seen subsolid nodule, recommend follow-up CT in 3 months.Small left and trace right pleural effusions.   Afebrile, vital signs stable  Pertinent GI Studies      Labs and Imaging:  Recent Labs    03/24/24 1622  PROT 9.3*  ALBUMIN 3.2*  AST 24  ALT 16  ALKPHOS 72  BILITOT 0.6   Recent Labs    03/24/24 1622 03/25/24 0338 03/26/24 0449  WBC 12.3* 10.4 10.2  HGB 11.0* 10.0* 9.2*  HCT 36.3* 32.8* 29.0*  MCV 86.0 85.2 81.7  PLT 479* 563* 483*   Recent Labs    03/24/24 1622 03/25/24 0338 03/26/24 0449  NA 135 136 133*  K 4.7 4.1 4.2  CL 96* 99 99  CO2 24 27 24   GLUCOSE 101* 107* 90  BUN 23 23 12   CREATININE 0.99 0.99 0.87  CALCIUM 8.9 8.2* 8.1*     MR CERVICAL SPINE W WO CONTRAST EXAM: MRI CERVICAL SPINE WITH AND WITHOUT CONTRAST 03/27/2024 12:46:54 AM  TECHNIQUE: Multiplanar multisequence MRI of the cervical spine was performed without and with the administration of 6 mL gadobutrol (GADAVIST) 1 MMOL/ML  intravenous contrast.  COMPARISON: None available.  CLINICAL HISTORY: Rule out discitis / epidural infection.  FINDINGS:  BONES AND ALIGNMENT: Normal alignment. Normal vertebral body heights. Marrow signal is unremarkable. No abnormal enhancement.  SPINAL CORD: Normal spinal cord size. Normal spinal cord signal.  SOFT TISSUES: No paraspinal mass.  C2-C3: No significant disc herniation. No spinal canal stenosis or neural foraminal narrowing.  C3-C4: Small disc bulge with bilateral uncovertebral hypertrophy. Mild spinal canal stenosis. Moderate right and severe left foraminal stenosis.  C4-C5: Minimal disc bulge. Mild left foraminal stenosis.  C5-C6: Small disc bulge with bilateral uncovertebral hypertrophy. Moderate bilateral foraminal stenosis.  C6-C7: Small disc osteophyte complex. Mild left foraminal stenosis.  C7-T1: No significant disc herniation. No spinal canal stenosis or neural foraminal narrowing.  IMPRESSION: 1. C3-4: Severe left and moderate right neural foraminal stenosis; mild spinal canal stenosis. 2. C5-6: Moderate bilateral neural foraminal stenosis.  Electronically signed by: Franky Stanford MD 03/27/2024 01:09 AM EDT RP Workstation: [id number]           Past Medical  History:  Diagnosis Date   COPD (chronic obstructive pulmonary disease) (HCC)    Depression    Sinus congestion     Past Surgical History:  Procedure Laterality Date   CYSTOSCOPY/RETROGRADE/URETEROSCOPY Bilateral 08/15/2018   Procedure: CYSTOSCOPY/RETROGRADE URETHROGRAM  MINDI AND PERINEUM IRRIGATION AND DEBRIDEMENT;  Surgeon: Cam Morene ORN, MD;  Location: [facility];  Service: Urology;  Laterality: Bilateral;    Family History  Problem Relation Age of Onset   Hypertension Mother    Colon cancer Neg Hx     Prior to Admission medications   Medication Sig Start Date End Date Taking? Authorizing Provider  ibuprofen  (ADVIL ) 200 MG tablet Take 200 mg by  mouth every 6 (six) hours as needed for moderate pain (pain score 4-6).   Yes [provider]  lidocaine  (LIDODERM ) 5 % Place 1 patch onto the skin daily. Remove & Discard patch within 12 hours or as directed by MD Patient not taking: Reported on 03/25/2024 03/06/24   Towana Ozell BROCKS, MD    Current Facility-Administered Medications  Medication Dose Route Frequency Provider Last Rate Last Admin   acetaminophen  (TYLENOL ) tablet 1,000 mg  1,000 mg Oral Q6H PRN Segars, Jonathan, MD   1,000 mg at 03/26/24 0324   albuterol  (PROVENTIL ) (2.5 MG/3ML) 0.083% nebulizer solution 2.5 mg  2.5 mg Nebulization Q4H PRN Keturah Carrier, MD       ceFAZolin  (ANCEF ) IVPB 2g/100 mL premix  2 g Intravenous Q8H Tanda Dempsey SAUNDERS, RPH 200 mL/hr at 03/27/24 0557 2 g at 03/27/24 0557   feeding supplement (ENSURE PLUS HIGH PROTEIN) liquid 237 mL  237 mL Oral BID BM Segars, Jonathan, MD   237 mL at 03/26/24 0934   folic acid (FOLVITE) tablet 1 mg  1 mg Oral Daily Segars, Jonathan, MD   1 mg at 03/26/24 0934   HYDROmorphone (DILAUDID) injection 0.5 mg  0.5 mg Intravenous Q4H PRN Segars, Jonathan, MD   0.5 mg at 03/27/24 0056   insulin aspart (novoLOG) injection 0-6 Units  0-6 Units Subcutaneous TID WC Segars, Jonathan, MD       melatonin tablet 6 mg  6 mg Oral QHS PRN Segars, Jonathan, MD   6 mg at 03/24/24 2315   methocarbamol  (ROBAXIN ) tablet 500 mg  500 mg Oral Q8H PRN Segars, Jonathan, MD   500 mg at 03/26/24 0100   multivitamin with minerals tablet 1 tablet  1 tablet Oral Daily Segars, Jonathan, MD   1 tablet at 03/26/24 9065   ondansetron  (ZOFRAN ) injection 4 mg  4 mg Intravenous Q6H PRN Keturah Carrier, MD       oxyCODONE  (Oxy IR/ROXICODONE ) immediate release tablet 2.5 mg  2.5 mg Oral Q6H PRN Keturah Carrier, MD       Or   oxyCODONE  (Oxy IR/ROXICODONE ) immediate release tablet 5 mg  5 mg Oral Q6H PRN Segars, Jonathan, MD   5 mg at 03/26/24 1823   polyethylene glycol (MIRALAX / GLYCOLAX) packet 17 g  17 g  Oral Daily PRN Keturah Carrier, MD       sodium chloride  flush (NS) 0.9 % injection 3 mL  3 mL Intravenous Q12H Segars, Jonathan, MD   3 mL at 03/26/24 2104   thiamine (VITAMIN B1) tablet 100 mg  100 mg Oral Daily Segars, Jonathan, MD   100 mg at 03/26/24 9065   Or   thiamine (VITAMIN B1) injection 100 mg  100 mg Intravenous Daily Segars, Jonathan, MD        Allergies as of 03/24/2024 -  Review Complete 03/24/2024  Allergen Reaction Noted   Bc fast pain relief [aspirin -salicylamide-caffeine] Other (See Comments) 10/07/2021    Social History   Socioeconomic History   Marital status: Single    Spouse name: Not on file   Number of children: Not on file   Years of education: Not on file   Highest education level: Not on file  Occupational History   Not on file  Tobacco Use   Smoking status: Former    Current packs/day: 0.00    Average packs/day: 1 pack/day for 30.0 years (30.0 ttl pk-yrs)    Types: Cigarettes    Start date: 05/1991    Quit date: 05/2021    Years since quitting: 2.8   Smokeless tobacco: Never  Vaping Use   Vaping status: Never Used  Substance and Sexual Activity   Alcohol use: Yes    Alcohol/week: 4.0 standard drinks of alcohol    Types: 4 Cans of beer per week    Comment: occa   Drug use: Yes    Types: Marijuana, Cocaine    Comment: occassionally (last 05/23/18)   Sexual activity: Not on file  Other Topics Concern   Not on file  Social History Narrative   Not on file   Social Drivers of Health   Financial Resource Strain: Not on file  Food Insecurity: No Food Insecurity (03/25/2024)   Hunger Vital Sign    Worried About Running Out of Food in the Last Year: Never true    Ran Out of Food in the Last Year: Never true  Transportation Needs: No Transportation Needs (03/25/2024)   PRAPARE - Administrator, Civil Service (Medical): No    Lack of Transportation (Non-Medical): No  Physical Activity: Not on file  Stress: Not on file  Social  Connections: Unknown (03/25/2024)   Social Connection and Isolation Panel    Frequency of Communication with Friends and Family: Once a week    Frequency of Social Gatherings with Friends and Family: Once a week    Attends Religious Services: 1 to 4 times per year    Active Member of [employer] or Organizations: No    Attends Banker Meetings: Never    Marital Status: Patient declined  Catering manager Violence: Not At Risk (03/25/2024)   Humiliation, Afraid, Rape, and Kick questionnaire    Fear of Current or Ex-Partner: No    Emotionally Abused: No    Physically Abused: No    Sexually Abused: No     Code Status   Code Status: Full Code  Review of Systems: All systems reviewed and negative except where noted in HPI.  Physical Exam: Vital signs in last 24 hours: Temp:  [98.7 F (37.1 C)-100.7 F (38.2 C)] 98.7 F (37.1 C) (10/02 0812) Pulse Rate:  [84-94] 84 (10/02 0812) Resp:  [17-19] 18 (10/02 0500) BP: (91-128)/(53-75) 128/75 (10/02 0812) SpO2:  [94 %-100 %] 96 % (10/02 0812) Last BM Date : 03/24/24  General:  Pleasant ***male in NAD Psych:  Cooperative. Normal mood and affect Eyes: Pupils equal Ears:  Normal auditory acuity Nose: No deformity, discharge or lesions Neck:  Supple, no masses felt Lungs:  Clear to auscultation.  Heart:  Regular rate, regular rhythm.  Abdomen:  Soft, nondistended, nontender, active bowel sounds, no masses felt Rectal :  Deferred Msk: Symmetrical without gross deformities.  Neurologic:  Alert, oriented, grossly normal neurologically Extremities : No edema Skin:  Intact without significant lesions.    Intake/Output from  previous day: 10/01 0701 - 10/02 0700 In: 660 [P.O.:360; IV Piggyback:300] Out: 300 [Urine:300] Intake/Output this shift:  No intake/output data recorded.   Vina Dasen, NP-C   03/27/2024, 9:00 AM

## 2024-03-27 NOTE — H&P (View-Only) (Signed)
 [facility] Progress Note Patient: Gregory Camacho FMW:[id number] DOB: Apr 12, 1961  DOA: 03/24/2024 DOS: the patient was seen and examined on 03/27/2024  Brief Hospital Course: [name] is a 63 y.o. male with hx of COPD, DM type 2, substance use disorder (cocaine, marijuana, tobacco), hx of persistent leukocytosis / thrombocytosis, suspected reactive, IDA, mood d/o, who presented with worsening back pain. Unclear when began, but has worsened since around time of last ED visit on 9/11.  Patient has been admitted with concerns for L1 discitis/osteomyelitis and possible abscess.  There is concern for MSSA bacteremia with hematogenous spread given that he recently had urinary tract infection which was positive for MSSA.  MRI of the spine is concerning for L1 discitis/osteomyelitis.  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 Presented with complaints of back pain and fever. Initially presented in ED on 9/11 for back pain. He had a urine culture which was positive for Staph aureus.  Had a CT stone study which was negative for any renal abnormality. No history of IV drug abuse. Due to fever and leukocytosis, initiated on IV fluid.  Blood cultures were performed.  MRI showed possible L1 osteomyelitis. ED provider discussed with neurosurgery who does not feel that the patient has any operative intervention indication. Admitted to the hospital for further workup.  Transferred to [facility] from any pain for TEE and ID consultation. Echocardiogram shows normal valves without any vegetation. No significant regurgitation. TEE was scheduled on 10/2 but now scheduled on 10/3.  Polysubstance abuse. Patient is a former smoker quit smoking recently and also abuses cocaine. Denies any IV drug use. Monitor for withdrawals. Counseled to quit abusing substance and smoking.   Patient currently agreeable.  History of COPD. No evidence of exacerbation right now. Continue as needed treatments.  Underweight. Failure to thrive. Body mass index is 17.43 kg/m.  Placing the patient at high risk for poor outcome.  Right lower lobe pulmonary nodule. 2 cm x 1.1 cm. Repeat CT scan in 3 months.   Concern for dysphagia. Currently ruled out. Unsure about the origin for this concern although patient denies clearly that he does not have any history of difficulty swallowing. GI was consulted but currently has signed off. At present recommended no intervention.  Subjective: Denies any acute complaint.  No nausea no vomiting.  Pain is well-controlled.  No numbness no tingling no loss of control of bowel bladder.  Physical Exam: Clear to auscultation. S1-S2 present Bowel sound present No edema Bilateral equal motor strength Equal sensation. No asterixis. No pronator Drift.  Data Reviewed: I have Reviewed nursing notes, Vitals, and Lab results. Since last encounter, pertinent lab results CBC and BMP   . I have ordered test including CBC and BMP  . I have discussed pt's care plan and test results with GI  .   Disposition: Status is: Inpatient Remains inpatient appropriate because: Monitor for improvement in blood cultures and further workup  Place and maintain sequential compression device Start: 03/27/24 1346   Family Communication: Family at bedside Level of care: Telemetry Medical   Vitals:   03/26/24 2357 03/27/24 0500 03/27/24 0812 03/27/24 1549  BP: (!) 91/53 119/70 128/75 112/71  Pulse: 94 89 84 92  Resp: 19 18    Temp: 98.7 F (37.1 C) 99 F (37.2 C) 98.7 F (37.1 C)   TempSrc: Oral Oral Oral   SpO2: 100% 99% 96%  100%  Weight:      Height:         Author: Yetta Blanch, MD 03/27/2024 5:09 PM  Please look on [email or web address] to find out who is on call.

## 2024-03-27 NOTE — Hospital Course (Addendum)
 Gregory Camacho is a 63 y.o. male with hx of COPD, DM type 2, substance use disorder (cocaine, marijuana, tobacco), hx of persistent leukocytosis / thrombocytosis, suspected reactive, IDA, mood d/o, who presented with worsening back pain. Unclear when began, but has worsened since around time of last ED visit on 9/11.  Patient has been admitted with concerns for L1 discitis/osteomyelitis and possible abscess.  There is concern for MSSA bacteremia with hematogenous spread given that he recently had urinary tract infection which was positive for MSSA.  MRI of the spine is concerning for L1 discitis/osteomyelitis.  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 Presented with complaints of back pain and fever. Initially presented in ED on 9/11 for back pain. He had a urine culture which was positive for Staph aureus.  Had a CT stone study which was negative for any renal abnormality. No history of IV drug abuse. Due to fever and leukocytosis, initiated on IV fluid.  Blood cultures were performed.  MRI showed possible L1 osteomyelitis. ED provider discussed with neurosurgery who does not feel that the patient has any operative intervention indication. Admitted to the hospital for further workup.  Transferred to [facility] from any pain for TEE and ID consultation. Echocardiogram shows normal valves without any vegetation. No significant regurgitation. TEE on 10/3 negative for any vegetation. ID recommending IV antibiotics at home. Patient does not have any history of IV drug abuse and therefore would be a candidate for PICC line at home. TOC consulted.  PICC line tomorrow.  Polysubstance abuse. Patient is a former smoker quit smoking recently and also abuses cocaine. Denies any IV drug use. Monitor for withdrawals. Counseled to quit abusing substance and smoking.  Patient  currently agreeable.  History of COPD. No evidence of exacerbation right now. Continue as needed treatments.  Underweight. Failure to thrive. Body mass index is 17.43 kg/m.  Placing the patient at high risk for poor outcome.  Right lower lobe pulmonary nodule. 2 cm x 1.1 cm. Repeat CT scan in 3 months.

## 2024-03-28 ENCOUNTER — Encounter (HOSPITAL_COMMUNITY)
Admission: EM | Disposition: A | Discharge status: Home / Self Care | Payer: Self-pay | Source: Home / Self Care | Attending: Internal Medicine

## 2024-03-28 ENCOUNTER — Inpatient Hospital Stay (HOSPITAL_COMMUNITY): Admitting: Anesthesiology

## 2024-03-28 ENCOUNTER — Inpatient Hospital Stay (HOSPITAL_COMMUNITY)

## 2024-03-28 DIAGNOSIS — Z8719 Personal history of other diseases of the digestive system: Secondary | ICD-10-CM | POA: Diagnosis not present

## 2024-03-28 DIAGNOSIS — R7881 Bacteremia: Secondary | ICD-10-CM | POA: Diagnosis not present

## 2024-03-28 DIAGNOSIS — J449 Chronic obstructive pulmonary disease, unspecified: Secondary | ICD-10-CM

## 2024-03-28 DIAGNOSIS — I38 Endocarditis, valve unspecified: Secondary | ICD-10-CM | POA: Diagnosis not present

## 2024-03-28 DIAGNOSIS — B9561 Methicillin susceptible Staphylococcus aureus infection as the cause of diseases classified elsewhere: Secondary | ICD-10-CM | POA: Diagnosis not present

## 2024-03-28 DIAGNOSIS — E119 Type 2 diabetes mellitus without complications: Secondary | ICD-10-CM

## 2024-03-28 DIAGNOSIS — Z87891 Personal history of nicotine dependence: Secondary | ICD-10-CM

## 2024-03-28 HISTORY — PX: TRANSESOPHAGEAL ECHOCARDIOGRAM (CATH LAB): EP1270

## 2024-03-28 LAB — MAGNESIUM: Magnesium: 2 mg/dL (ref 1.7–2.4)

## 2024-03-28 LAB — BASIC METABOLIC PANEL WITH GFR
Anion gap: 8 (ref 5–15)
BUN: 11 mg/dL (ref 8–23)
CO2: 30 mmol/L (ref 22–32)
Calcium: 8.4 mg/dL — ABNORMAL LOW (ref 8.9–10.3)
Chloride: 100 mmol/L (ref 98–111)
Creatinine, Ser: 0.96 mg/dL (ref 0.61–1.24)
GFR, Estimated: >60 mL/min (ref >60)
Glucose, Bld: 105 mg/dL — ABNORMAL HIGH (ref 70–99)
Potassium: 4.6 mmol/L (ref 3.5–5.1)
Sodium: 138 mmol/L (ref 135–145)

## 2024-03-28 LAB — GLUCOSE, CAPILLARY
Glucose-Capillary: 101 mg/dL — ABNORMAL HIGH (ref 70–99)
Glucose-Capillary: 99 mg/dL (ref 70–99)
Glucose-Capillary: 92 mg/dL (ref 70–99)
Glucose-Capillary: 126 mg/dL — ABNORMAL HIGH (ref 70–99)

## 2024-03-28 LAB — CBC
HCT: 30.6 % — ABNORMAL LOW (ref 39.0–52.0)
Hemoglobin: 9.7 g/dL — ABNORMAL LOW (ref 13.0–17.0)
MCH: 25.9 pg — ABNORMAL LOW (ref 26.0–34.0)
MCHC: 31.7 g/dL (ref 30.0–36.0)
MCV: 81.8 fL (ref 80.0–100.0)
Platelets: 572 K/uL — ABNORMAL HIGH (ref 150–400)
RBC: 3.74 MIL/uL — ABNORMAL LOW (ref 4.22–5.81)
RDW: 13.7 % (ref 11.5–15.5)
WBC: 10 K/uL (ref 4.0–10.5)
nRBC: 0 % (ref 0.0–0.2)

## 2024-03-28 LAB — ECHO TEE

## 2024-03-28 SURGERY — TRANSESOPHAGEAL ECHOCARDIOGRAM (TEE) (CATHLAB)
Anesthesia: Monitor Anesthesia Care

## 2024-03-28 MED ORDER — LIDOCAINE 2% (20 MG/ML) 5 ML SYRINGE
INTRAMUSCULAR | Status: DC | PRN
  Administered 2024-03-28: 80 mg via INTRAVENOUS

## 2024-03-28 MED ORDER — INSULIN ASPART 100 UNIT/ML IJ SOLN
0.0000 [IU] | Freq: Three times a day (TID) | INTRAMUSCULAR | Status: DC
  Administered 2024-03-30: 1 [IU] via SUBCUTANEOUS

## 2024-03-28 MED ORDER — INSULIN ASPART 100 UNIT/ML IJ SOLN: 0.0000 [IU] | INTRAMUSCULAR | Status: DC

## 2024-03-28 MED ORDER — ONDANSETRON HCL 4 MG/2ML IJ SOLN
INTRAMUSCULAR | Status: DC | PRN
  Administered 2024-03-28: 4 mg via INTRAVENOUS

## 2024-03-28 MED ORDER — PHENYLEPHRINE 80 MCG/ML (10ML) SYRINGE FOR IV PUSH (FOR BLOOD PRESSURE SUPPORT)
PREFILLED_SYRINGE | INTRAVENOUS | Status: DC | PRN
  Administered 2024-03-28 (×2): 80 ug via INTRAVENOUS
  Administered 2024-03-28 (×2): 160 ug via INTRAVENOUS
  Administered 2024-03-28 (×2): 80 ug via INTRAVENOUS

## 2024-03-28 MED ORDER — PROPOFOL 500 MG/50ML IV EMUL
INTRAVENOUS | Status: DC | PRN
  Administered 2024-03-28: 125 ug/kg/min via INTRAVENOUS

## 2024-03-28 MED ORDER — PROPOFOL 10 MG/ML IV BOLUS
INTRAVENOUS | Status: DC | PRN
  Administered 2024-03-28 (×2): 20 mg via INTRAVENOUS

## 2024-03-28 NOTE — CV Procedure (Addendum)
    TRANSESOPHAGEAL ECHOCARDIOGRAM  INDICATIONS: MSSA Bacteremia  PROCEDURE:   Informed consent was obtained prior to the procedure. The risks, benefits and alternatives for the procedure were discussed and the patient comprehended these risks.  Risks include, but are not limited to, cough, sore throat, vomiting, nausea, somnolence, esophageal and stomach trauma or perforation, bleeding, low blood pressure, aspiration, pneumonia, infection, trauma to the teeth and death.    After a procedural time-out the patient was administered anesthesia via the anesthesia team.  The patient's heart rate, blood pressure, and oxygen saturation were monitored continuously during the procedure.   The transesophageal probe was inserted in the esophagus and stomach without difficulty and multiple views were obtained.  The patient was kept under observation until the patient left the procedure room.  The patient left the procedure room in stable condition.   Agitated microbubble saline contrast was not administered.  COMPLICATIONS:    There were no immediate complications.  FINDINGS:  - No vegetations noted - Prominent eustachian valve  Full report to follow  RECOMMENDATIONS:     Follow up with primary team  Time Spent Directly with the Patient:  33 minutes   Gregory Camacho Calender 03/28/2024, 1:40 PM

## 2024-03-28 NOTE — Progress Notes (Signed)
 [facility] Progress Note Patient: Gregory Camacho FMW:[id number] DOB: 04-27-1961  DOA: 03/24/2024 DOS: the patient was seen and examined on 03/28/2024  Brief Hospital Course: GUSTIN ZOBRIST is a 63 y.o. male with hx of COPD, DM type 2, substance use disorder (cocaine, marijuana, tobacco), hx of persistent leukocytosis / thrombocytosis, suspected reactive, IDA, mood d/o, who presented with worsening back pain. Unclear when began, but has worsened since around time of last ED visit on 9/11.  Patient has been admitted with concerns for L1 discitis/osteomyelitis and possible abscess.  There is concern for MSSA bacteremia with hematogenous spread given that he recently had urinary tract infection which was positive for MSSA.  MRI of the spine is concerning for L1 discitis/osteomyelitis.  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 Presented with complaints of back pain and fever. Initially presented in ED on 9/11 for back pain. He had a urine culture which was positive for Staph aureus.  Had a CT stone study which was negative for any renal abnormality. No history of IV drug abuse. Due to fever and leukocytosis, initiated on IV fluid.  Blood cultures were performed.  MRI showed possible L1 osteomyelitis. ED provider discussed with neurosurgery who does not feel that the patient has any operative intervention indication. Admitted to the hospital for further workup.  Transferred to [facility] from any pain for TEE and ID consultation. Echocardiogram shows normal valves without any vegetation. No significant regurgitation. TEE on 10/3 negative for any vegetation. Awaiting recommendation from ID with regards to duration of antibiotic. Awaiting culture clearance. Unsure if the patient is a home candidate for PICC line given history of substance abuse.  Polysubstance  abuse. Patient is a former smoker quit smoking recently and also abuses cocaine. Denies any IV drug use. Monitor for withdrawals. Counseled to quit abusing substance and smoking.  Patient currently agreeable.  History of COPD. No evidence of exacerbation right now. Continue as needed treatments.  Underweight. Failure to thrive. Body mass index is 17.43 kg/m.  Placing the patient at high risk for poor outcome.  Right lower lobe pulmonary nodule. 2 cm x 1.1 cm. Repeat CT scan in 3 months.   Subjective: Pain improving.  Improving mobility.  No nausea or vomiting.  No chills.  No chest pain.  Physical Exam: Clear to auscultation. S1-S2 present Bowel sound present No focal deficit.  Data Reviewed: I have Reviewed nursing notes, Vitals, and Lab results. Since last encounter, pertinent lab results CBC and BMP   . I have ordered test including CBC and BMP  .  Discussed with cardiology as well as ID  Disposition: Status is: Inpatient Remains inpatient appropriate because: Monitor for culture clearance  Place and maintain sequential compression device Start: 03/27/24 1346   Family Communication: No one at bedside Level of care: Telemetry Medical   Vitals:   03/28/24 1410 03/28/24 1420 03/28/24 1430 03/28/24 1645  BP: 102/71 109/75 (!) 130/58 (!) 100/90  Pulse: 74 67 72 74  Resp: 16 20 19 17   Temp:    98 F (36.7 C)  TempSrc:    Oral  SpO2: 98% 99% 98% 99%  Weight:      Height:         Author: Yetta Blanch, MD 03/28/2024 6:59 PM  Please look on [email or web address] to find out who is on call.

## 2024-03-28 NOTE — TOC Initial Note (Addendum)
 Transition of Care (TOC) - Initial/Assessment Note   Patient from home alone. PT recommending HHPT.   NCM will continue to follow. Explained to patient home health may not agree to come to his home due to history of crack cocaine. PAtient states he only smoked crack cocaine prior to admission and will not smoke anymore . Patient states he has quit .   Again NCM explained NCM has to let home health know of history.   Will continue to follow to see if patient require PO or IV ABX   For HHPT patient will need a PCP . PCP will need to arrange HHPT after seen in office, unless a hospital MD is willing to follow   Patient states he has a PCP Dr Murvin Mana . Dr Mana is a hospitalist not PCP . CMA scheduled PCP appointment for patient. Patient can use Medicaid transportation.  Patient Details  Name: Gregory Camacho MRN: [id number] Date of Birth: [birth year]  Transition of Care [facility]) CM/SW Contact:    Stephane Powell Jansky, RN Phone Number: 03/28/2024, 9:21 AM  Clinical Narrative:                   Expected Discharge Plan:  (see note) Barriers to Discharge: Continued Medical Work up   Patient Goals and CMS Choice Patient states their goals for this hospitalization and ongoing recovery are:: to return to home CMS Medicare.gov Compare Post Acute Care list provided to:: Patient Choice offered to / list presented to : Patient      Expected Discharge Plan and Services   Discharge Planning Services: CM Consult Post Acute Care Choice: Home Health Living arrangements for the past 2 months: Apartment                 DME Arranged: N/A DME Agency: NA       HH Arranged:  (see note)          Prior Living Arrangements/Services Living arrangements for the past 2 months: Apartment Lives with:: Self Patient language and need for interpreter reviewed:: Yes Do you feel safe going back to the place where you live?: Yes      Need for Family Participation in Patient Care: No (Comment) Care  giver support system in place?: Yes (comment) Current home services: DME Criminal Activity/Legal Involvement Pertinent to Current Situation/Hospitalization: No - Comment as needed  Activities of Daily Living   ADL Screening (condition at time of admission) Independently performs ADLs?: No Does the patient have a NEW difficulty with bathing/dressing/toileting/self-feeding that is expected to last >3 days?: No Does the patient have a NEW difficulty with getting in/out of bed, walking, or climbing stairs that is expected to last >3 days?: No Does the patient have a NEW difficulty with communication that is expected to last >3 days?: No Is the patient deaf or have difficulty hearing?: No Does the patient have difficulty seeing, even when wearing glasses/contacts?: No Does the patient have difficulty concentrating, remembering, or making decisions?: No  Permission Sought/Granted   Permission granted to share information with : Yes, Verbal Permission Granted     Permission granted to share info w AGENCY: Home health agencies        Emotional Assessment Appearance:: Appears stated age Attitude/Demeanor/Rapport: Engaged Affect (typically observed): Appropriate Orientation: : Oriented to Self, Oriented to Place, Oriented to  Time, Oriented to Situation Alcohol / Substance Use: Not Applicable Psych Involvement: No (comment)  Admission diagnosis:  Bacteremia [R78.81] Vertebral osteomyelitis (HCC) [M46.20] Sepsis, due  to unspecified organism, unspecified whether acute organ dysfunction present [facility]) [A41.9] Patient Active Problem List   Diagnosis Date Noted   Acute osteomyelitis of lumbar spine (HCC) 03/26/2024   MSSA bacteremia 03/24/2024   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 Pharmacy:   [facility] [phone] - [place], [place] - [address] AT [facility] [address] [place] [address] Phone: [phone] Fax: [phone]     Social Drivers of Health (SDOH) Social History: SDOH Screenings   Food Insecurity: No Food Insecurity (03/25/2024)  Housing: Unknown (03/25/2024)  Transportation Needs: No Transportation Needs (03/25/2024)  Utilities: Not At Risk (03/25/2024)  Social Connections: Unknown (03/25/2024)  Tobacco Use: Medium Risk (03/24/2024)   SDOH Interventions:     Readmission Risk Interventions     No data to display

## 2024-03-28 NOTE — Transfer of Care (Signed)
 Immediate Anesthesia Transfer of Care Note  Patient: Reuben Knoblock Lenhardt  Procedure(s) Performed: TRANSESOPHAGEAL ECHOCARDIOGRAM  Patient Location: Cath Lab  Anesthesia Type:MAC  Level of Consciousness: sedated and drowsy  Airway & Oxygen Therapy: Patient Spontanous Breathing  Post-op Assessment: Report given to RN and Post -op Vital signs reviewed and stable  Post vital signs: Reviewed and stable  Last Vitals:  Vitals Value Taken Time  BP 118/59 03/28/24 13:45  Temp 36.5 C 03/28/24 13:40  Pulse 61 03/28/24 13:46  Resp 15 03/28/24 13:46  SpO2 97 % 03/28/24 13:46  Vitals shown include unfiled device data.  Last Pain:  Vitals:   03/28/24 1340  TempSrc: Tympanic  PainSc: Asleep         Complications: No notable events documented.

## 2024-03-28 NOTE — Anesthesia Postprocedure Evaluation (Signed)
 Anesthesia Post Note  Patient: Gregory Camacho  Procedure(s) Performed: TRANSESOPHAGEAL ECHOCARDIOGRAM     Patient location during evaluation: Cath Lab Anesthesia Type: MAC Level of consciousness: awake and alert Pain management: pain level controlled Vital Signs Assessment: post-procedure vital signs reviewed and stable Respiratory status: spontaneous breathing, nonlabored ventilation, respiratory function stable and patient connected to nasal cannula oxygen Cardiovascular status: stable and blood pressure returned to baseline Postop Assessment: no apparent nausea or vomiting Anesthetic complications: no   No notable events documented.  Last Vitals:  Vitals:   03/28/24 1420 03/28/24 1430  BP: 109/75 (!) 130/58  Pulse: 67 72  Resp: 20 19  Temp:    SpO2: 99% 98%    Last Pain:  Vitals:   03/28/24 1430  TempSrc:   PainSc: 0-No pain                 Rome Ade

## 2024-03-28 NOTE — Care Management Important Message (Signed)
 Important Message  Patient Details  Name: Gregory Camacho MRN: [id number] Date of Birth: 12-04-1960   Important Message Given:  Yes - Medicare IM     Jon Cruel 03/28/2024, 8:39 AM

## 2024-03-28 NOTE — Plan of Care (Signed)

## 2024-03-28 NOTE — Consult Note (Addendum)
 Consultation Note   Referring Provider:   [facility] PCP: Laurita Pillion, MD Primary Gastroenterologist:   [facility]      Reason for Consultation:  History of dysphagia DOA: 03/24/2024         Hospital Day: 5   ASSESSMENT    63 year old male admitted with MSSA bacteremia L1 discitis and osteomyelitis Neurosurgery does not feel operative intervention indicated .  No vegetation on valves seen on echocardiogram.  TEE is pending  History of transient dysphagia June 2023 Vague dysphagia symptoms described in [facility]'s office note in June 2023.  Patient does recall that conversation.  Plan was to do an EGD at the time of screening colonoscopy . Tells me he had pneumonia at the time and it was affecting his swallowing.  He has since had no problems swallowing and can eat anything he wants. EGD nor colonoscopy were never done  Weight loss Failure to thrive Etiology unclear at this point.  No hepatobiliary / pancreatic / bowel lesions on CT scan, though was without contrast.   Chronic anemia Hgb 9.7, down slightly from baseline ~ 11. Possibly nutritional deficits contributing . However, patient never did undergo recommended screening colonoscopy in 2023.   Chronic thrombocytosis / leukocytosis Evaluated by Hematology in April 2023.  Felt to have reactive leukocytosis and thrombocytosis in the setting of multiple listed codes of recurrent pneumonia.   Illicit drug use Smokes crack  History of COPD RLL lung nodule on CT scan  See [facility] for any additional medical history  / medical problems  Principal Problem:   MSSA bacteremia Active Problems:   COPD GOLD 2   Acute osteomyelitis of lumbar spine (HCC)   PLAN:   --Patient assures me he has no dysphagia.  He has no odynophagia.  Based on the history he he provides, an esophageal mass/stricture seems unlikely.  --He needs to follow up with [facility] to get rescheduled for screening colonoscopy.   --A 3 month repeat CT scan recommended for lung nodule follow up  HPI   Patient admitted with back pain, weakness in legs and fevers. Workup remarkable for  MSSA bacteremia. MRI concerning for possible L1 osteomyelitis. Also,  recent UTI ( staph aureus).   He does not use IV drugs.    Patient scheduled for TEE but due to a history of dysphagia , Cardiology wanted GI clearance   Patient was seen by [facility] June 2023 for screening colonoscopy.  At the time of that visit he was having vague dysphagia symptoms so plan was to add on an EGD to the screening colonoscopy.  However, patient never had either procedure done  I went in to see the patient yesterday to do this consult but he told me he was not having any dysphagia and has not had any problems swallowing.   He does recall mentioning some swallowing problems to [facility] gastro in June 2023.  Tells me he had pneumonia at the time and did have some transient swallowing issues the problem quickly resolved.  Says he can eat anything he wants as long as he is able to chew it up (poor dentition).  He has no pain with swallowing.   [place] has been losing weight but is  not sure why. Non-contrast CT AP showed RLL nodule but otherwise no acute findings / concerns for malignancy. However, limited study without IV contrast.   Admission labs notable for: -WBC 12.3 -Hemoglobin 11 (at baseline) -Platelets 483 -Albumin 3.2 -Total protein elevated 9.3 -Chest CT- confluent subpleural nodule in the lateral right lower lobe in the area of the previously seen subsolid nodule, recommend follow-up CT in 3 months.Small left and trace right pleural effusions  Pertinent GI Studies   Labs and Imaging:  No results for input(s): PROT, ALBUMIN, AST, ALT, ALKPHOS, BILITOT, BILIDIR, IBILI in the last 72 hours. Recent Labs    03/26/24 0449 03/28/24 0818  WBC 10.2 10.0  HGB 9.2*  9.7*  HCT 29.0* 30.6*  MCV 81.7 81.8  PLT 483* 572*   Recent Labs    03/26/24 0449 03/28/24 0818  NA 133* 138  K 4.2 4.6  CL 99 100  CO2 24 30  GLUCOSE 90 105*  BUN 12 11  CREATININE 0.87 0.96  CALCIUM 8.1* 8.4*      Past Medical History:  Diagnosis Date   COPD (chronic obstructive pulmonary disease) (HCC)    Depression    Sinus congestion     Past Surgical History:  Procedure Laterality Date   CYSTOSCOPY/RETROGRADE/URETEROSCOPY Bilateral 08/15/2018   Procedure: CYSTOSCOPY/RETROGRADE URETHROGRAM  MINDI AND PERINEUM IRRIGATION AND DEBRIDEMENT;  Surgeon: Cam Morene ORN, MD;  Location: [facility];  Service: Urology;  Laterality: Bilateral;    Family History  Problem Relation Age of Onset   Hypertension Mother    Colon cancer Neg Hx     Prior to Admission medications   Medication Sig Start Date End Date Taking? Authorizing Provider  ibuprofen  (ADVIL ) 200 MG tablet Take 200 mg by mouth every 6 (six) hours as needed for moderate pain (pain score 4-6).   Yes [provider]  lidocaine  (LIDODERM ) 5 % Place 1 patch onto the skin daily. Remove & Discard patch within 12 hours or as directed by MD Patient not taking: Reported on 03/25/2024 03/06/24   Towana Ozell BROCKS, MD    Current Facility-Administered Medications  Medication Dose Route Frequency Provider Last Rate Last Admin   acetaminophen  (TYLENOL ) tablet 1,000 mg  1,000 mg Oral Q6H PRN Segars, Jonathan, MD   1,000 mg at 03/26/24 0324   albuterol  (PROVENTIL ) (2.5 MG/3ML) 0.083% nebulizer solution 2.5 mg  2.5 mg Nebulization Q4H PRN Keturah Carrier, MD       ceFAZolin  (ANCEF ) IVPB 2g/100 mL premix  2 g Intravenous Q8H Tanda Dempsey SAUNDERS, RPH 200 mL/hr at 03/28/24 0522 2 g at 03/28/24 0522   feeding supplement (ENSURE PLUS HIGH PROTEIN) liquid 237 mL  237 mL Oral BID BM Segars, Jonathan, MD   237 mL at 03/27/24 1437   folic acid (FOLVITE) tablet 1 mg  1 mg Oral Daily Segars, Jonathan, MD   1 mg at 03/26/24 0934    HYDROmorphone (DILAUDID) injection 0.5 mg  0.5 mg Intravenous Q4H PRN Segars, Jonathan, MD   0.5 mg at 03/27/24 0056   insulin aspart (novoLOG) injection 0-6 Units  0-6 Units Subcutaneous Q4H Patel, Pranav M, MD       melatonin tablet 6 mg  6 mg Oral QHS PRN Segars, Jonathan, MD   6 mg at 03/24/24 2315   methocarbamol  (ROBAXIN ) tablet 500 mg  500 mg Oral Q8H PRN Segars, Jonathan, MD   500 mg at 03/26/24 0100   multivitamin with minerals tablet 1 tablet  1 tablet Oral Daily Segars, Carrier,  MD   1 tablet at 03/26/24 0934   ondansetron  (ZOFRAN ) injection 4 mg  4 mg Intravenous Q6H PRN Keturah Carrier, MD       oxyCODONE  (Oxy IR/ROXICODONE ) immediate release tablet 2.5 mg  2.5 mg Oral Q6H PRN Keturah Carrier, MD       Or   oxyCODONE  (Oxy IR/ROXICODONE ) immediate release tablet 5 mg  5 mg Oral Q6H PRN Keturah Carrier, MD   5 mg at 03/28/24 0255   polyethylene glycol (MIRALAX / GLYCOLAX) packet 17 g  17 g Oral Daily PRN Keturah Carrier, MD       sodium chloride  flush (NS) 0.9 % injection 3 mL  3 mL Intravenous Q12H Segars, Jonathan, MD   3 mL at 03/28/24 0957   thiamine (VITAMIN B1) tablet 100 mg  100 mg Oral Daily Segars, Jonathan, MD   100 mg at 03/26/24 9065   Or   thiamine (VITAMIN B1) injection 100 mg  100 mg Intravenous Daily Segars, Jonathan, MD   100 mg at 03/28/24 0949    Allergies as of 03/24/2024 - Review Complete 03/24/2024  Allergen Reaction Noted   Bc fast pain relief [aspirin -salicylamide-caffeine] Other (See Comments) 10/07/2021    Social History   Socioeconomic History   Marital status: Single    Spouse name: Not on file   Number of children: Not on file   Years of education: Not on file   Highest education level: Not on file  Occupational History   Not on file  Tobacco Use   Smoking status: Former    Current packs/day: 0.00    Average packs/day: 1 pack/day for 30.0 years (30.0 ttl pk-yrs)    Types: Cigarettes    Start date: 05/1991    Quit date: 05/2021     Years since quitting: 2.8   Smokeless tobacco: Never  Vaping Use   Vaping status: Never Used  Substance and Sexual Activity   Alcohol use: Yes    Alcohol/week: 4.0 standard drinks of alcohol    Types: 4 Cans of beer per week    Comment: occa   Drug use: Yes    Types: Marijuana, Cocaine    Comment: occassionally (last 05/23/18)   Sexual activity: Not on file  Other Topics Concern   Not on file  Social History Narrative   Not on file   Social Drivers of Health   Financial Resource Strain: Not on file  Food Insecurity: No Food Insecurity (03/25/2024)   Hunger Vital Sign    Worried About Running Out of Food in the Last Year: Never true    Ran Out of Food in the Last Year: Never true  Transportation Needs: No Transportation Needs (03/25/2024)   PRAPARE - Administrator, Civil Service (Medical): No    Lack of Transportation (Non-Medical): No  Physical Activity: Not on file  Stress: Not on file  Social Connections: Unknown (03/25/2024)   Social Connection and Isolation Panel    Frequency of Communication with Friends and Family: Once a week    Frequency of Social Gatherings with Friends and Family: Once a week    Attends Religious Services: 1 to 4 times per year    Active Member of [employer] or Organizations: No    Attends Banker Meetings: Never    Marital Status: Patient declined  Catering manager Violence: Not At Risk (03/25/2024)   Humiliation, Afraid, Rape, and Kick questionnaire    Fear of Current or Ex-Partner: No    Emotionally  Abused: No    Physically Abused: No    Sexually Abused: No     Code Status   Code Status: Full Code  Review of Systems: All systems reviewed and negative except where noted in HPI.  Physical Exam: Vital signs in last 24 hours: Temp:  [98.5 F (36.9 C)-98.7 F (37.1 C)] 98.7 F (37.1 C) (10/03 0857) Pulse Rate:  [71-92] 71 (10/03 0857) Resp:  [16-18] 17 (10/03 0857) BP: (112-117)/(65-72) 117/72 (10/03 0857) SpO2:   [98 %-100 %] 100 % (10/03 0857) Last BM Date : 03/24/24  General:  Pleasant thin male in NAD Psych:  Cooperative. Normal mood and affect Eyes: Pupils equal Ears:  Normal auditory acuity Nose: No deformity, discharge or lesions Neck:  Supple, no masses felt Lungs:  Clear to auscultation.  Heart:  Regular rate, regular rhythm.  Abdomen:  Soft, nondistended, nontender, active bowel sounds, no masses felt Rectal :  Deferred Msk: Symmetrical without gross deformities.  Neurologic:  Alert, oriented, grossly normal neurologically Extremities : No edema Skin:  Intact without significant lesions.    Intake/Output from previous day: 10/02 0701 - 10/03 0700 In: 840 [P.O.:840] Out: 2500 [Urine:2500] Intake/Output this shift:  Total I/O In: -  Out: 400 [Urine:400]   Vina Dasen, NP-C   03/28/2024, 11:38 AM   Attending physician's note   I have reviewed the chart and discussed his care on rounds.  I agree with the APP's note, impression, and recommendations with my edits.  Patient was actually already off the floor at his TEE when I came to interview and examine him.  Nonetheless, while he does have a reported history of vein dysphagia back in 11/2021, the symptoms had completely resolved and he had otherwise been without any dysphagia.  Had been tolerating p.o. intake without any issue.  There is no indication for endoscopy at this juncture.  GI service available as needed.  Can otherwise follow-up with [facility] as an outpatient.  [address], DO, FACG [phone] office

## 2024-03-28 NOTE — Anesthesia Preprocedure Evaluation (Signed)
 Anesthesia Evaluation  Patient identified by MRN, date of birth, ID band Patient awake    Reviewed: Allergy & Precautions, NPO status , Patient's Chart, lab work & pertinent test results  History of Anesthesia Complications Negative for: history of anesthetic complications  Airway Mallampati: II  TM Distance: >3 FB Neck ROM: Full    Dental  (+) Edentulous Upper   Pulmonary neg sleep apnea, COPD, Patient abstained from smoking.Not current smoker, former smoker   Pulmonary exam normal breath sounds clear to auscultation       Cardiovascular Exercise Tolerance: Good METS(-) hypertension(-) CAD and (-) Past MI negative cardio ROS (-) dysrhythmias  Rhythm:Regular Rate:Normal - Systolic murmurs    Neuro/Psych  PSYCHIATRIC DISORDERS  Depression    negative neurological ROS     GI/Hepatic ,neg GERD  ,,(+)     (-) substance abuse    Endo/Other  diabetes    Renal/GU negative Renal ROS     Musculoskeletal   Abdominal   Peds  Hematology   Anesthesia Other Findings Past Medical History: No date: COPD (chronic obstructive pulmonary disease) (HCC) No date: Depression No date: Sinus congestion  Reproductive/Obstetrics                              Anesthesia Physical Anesthesia Plan  ASA: 2  Anesthesia Plan: MAC   Post-op Pain Management: Minimal or no pain anticipated   Induction: Intravenous  PONV Risk Score and Plan: 1 and Propofol  infusion, TIVA and Ondansetron   Airway Management Planned: Nasal Cannula  Additional Equipment: None  Intra-op Plan:   Post-operative Plan:   Informed Consent: I have reviewed the patients History and Physical, chart, labs and discussed the procedure including the risks, benefits and alternatives for the proposed anesthesia with the patient or authorized representative who has indicated his/her understanding and acceptance.     Dental advisory  given  Plan Discussed with: CRNA and Surgeon  Anesthesia Plan Comments: (Discussed risks of anesthesia with patient, including possibility of difficulty with spontaneous ventilation under anesthesia necessitating airway intervention, PONV, and rare risks such as cardiac or respiratory or neurological events, and allergic reactions. Discussed the role of CRNA in patient's perioperative care. Patient understands.)        Anesthesia Quick Evaluation

## 2024-03-28 NOTE — Interval H&P Note (Signed)
 History and Physical Interval Note:  03/28/2024 1:06 PM  Gregory Camacho  has presented today for surgery, with the diagnosis of Bacteremia, MSSA.  The various methods of treatment have been discussed with the patient and family. After consideration of risks, benefits and other options for treatment, the patient has consented to  Procedure(s): TRANSESOPHAGEAL ECHOCARDIOGRAM (N/A) as a surgical intervention.  The patient's history has been reviewed, patient examined, no change in status, stable for surgery.  I have reviewed the patient's chart and labs.  Questions were answered to the patient's satisfaction.     [name]

## 2024-03-28 NOTE — Progress Notes (Signed)
 Echocardiogram Echocardiogram Transesophageal has been performed.  Thea Norlander 03/28/2024, 1:56 PM

## 2024-03-28 NOTE — Progress Notes (Signed)
 Physical Therapy Treatment Patient Details Name: Gregory Camacho MRN: [id number] DOB: 1961-02-07 Today's Date: 03/28/2024   History of Present Illness Gregory Camacho is a 63 y.o. male who [facility] 03/25/24 for MSSA bacteremia with L1 discitis and osteomyelitis. PMHx: COPD, T2DM, substance use disorder (cocaine, marijuana, tobacco), hx of persistent leukocytosis/thrombocytosis, suspected reactive, IDA, mood d/o.   PT Comments  Pt greeted supine in bed, pleasant and agreeable to PT session. Educated pt on back precautions as a conservative measure. He performed bed mobility using log roll technique with cues for sequencing and supervision for safety. Pt increased gait distance, ambulating ~170ft with CGA. He initially utilized [facility] in RUE and transitioned to no AD. Pt slightly unsteady, but no overt LOB. Session limited d/t pt's urgency to have a BM and stating it would be awhile. Will continue to follow acutely and advance appropriately.     If plan is discharge home, recommend the following: A little help with walking and/or transfers;A little help with bathing/dressing/bathroom;Assistance with cooking/housework;Assist for transportation;Help with stairs or ramp for entrance   Can travel by private vehicle        Equipment Recommendations  None recommended by PT    Recommendations for Other Services       Precautions / Restrictions Precautions Precautions: Fall Recall of Precautions/Restrictions: Intact Precaution/Restrictions Comments: No formal orders for back precautions, followed as a conservative measure. Restrictions Weight Bearing Restrictions Per Provider Order: No     Mobility  Bed Mobility Overal bed mobility: Needs Assistance Bed Mobility: Rolling, Sidelying to Sit Rolling: Supervision Sidelying to sit: Supervision       General bed mobility comments: Educated pt on log roll technique. Cues for sequencing. Increased time to complete.     Transfers Overall transfer level: Needs assistance Equipment used: None, Straight cane Transfers: Sit to/from Stand Sit to Stand: Contact guard assist           General transfer comment: Pt stood from lowest bed height. He powered up with BUE support from bed. Transitioned to holding SPC in RUE once standing. Good eccentric control.    Ambulation/Gait Ambulation/Gait assistance: Contact guard assist Gait Distance (Feet): 150 Feet Assistive device: Straight cane, None Gait Pattern/deviations: Step-through pattern, Decreased stride length Gait velocity: decreased Gait velocity interpretation: <1.8 ft/sec, indicate of risk for recurrent falls   General Gait Details: Pt ambulated with short slow steps. He was unsteady with feet crossing over initially. Cued pt to increase BOS and PT provided stability. Pt progressed well. He opted to stop using AD half way through the walk. Pt's gait speed decreased to be more cautions. Distance limited d/t sudden urgency to return to room for BM.   Stairs             Wheelchair Mobility     Tilt Bed    Modified Rankin (Stroke Patients Only)       Balance Overall balance assessment: Needs assistance Sitting-balance support: No upper extremity supported, Feet supported Sitting balance-Leahy Scale: Fair     Standing balance support: Single extremity supported, No upper extremity supported, During functional activity Standing balance-Leahy Scale: Fair                              Hotel manager: No apparent difficulties  Cognition Arousal: Alert Behavior During Therapy: WFL for tasks assessed/performed   PT - Cognitive impairments: No apparent impairments  PT - Cognition Comments: Pt A,Ox4 Following commands: Intact      Cueing Cueing Techniques: Verbal cues  Exercises      General Comments General comments (skin integrity, edema, etc.): Pt reported  urgency to have BM and stated he would need a while on the commode. Asked to be left in peace.      Pertinent Vitals/Pain Pain Assessment Pain Assessment: No/denies pain    Home Living                          Prior Function            PT Goals (current goals can now be found in the care plan section) Acute Rehab PT Goals Patient Stated Goal: Return Home PT Goal Formulation: With patient Time For Goal Achievement: 04/08/24 Potential to Achieve Goals: Good Progress towards PT goals: Progressing toward goals    Frequency           PT Plan      Co-evaluation              AM-PAC PT 6 Clicks Mobility   Outcome Measure  Help needed turning from your back to your side while in a flat bed without using bedrails?: A Little Help needed moving from lying on your back to sitting on the side of a flat bed without using bedrails?: A Little Help needed moving to and from a bed to a chair (including a wheelchair)?: A Little Help needed standing up from a chair using your arms (e.g., wheelchair or bedside chair)?: A Little Help needed to walk in hospital room?: A Little Help needed climbing 3-5 steps with a railing? : A Lot 6 Click Score: 17    End of Session Equipment Utilized During Treatment: Gait belt Activity Tolerance: Patient tolerated treatment well;Other (comment) (treatment limited secondary to urgency to have BM) Patient left: Other (comment);with call bell/phone within reach (in bathroom on commode) Nurse Communication: Mobility status;Other (comment) (NT aware pt is on commode and was instructed to pull cord to notify when he was done. NT verbalized she would keep an eye out for pt.) PT Visit Diagnosis: Unsteadiness on feet (R26.81);Other abnormalities of gait and mobility (R26.89);Muscle weakness (generalized) (M62.81)     Time: 8984-8974 PT Time Calculation (min) (ACUTE ONLY): 10 min  Charges:    $Gait Training: 8-22 mins PT General  Charges $$ ACUTE PT VISIT: 1 Visit                     Randall SAUNDERS, PT, DPT Acute Rehabilitation Services Office: [phone] Secure Chat Preferred  Delon CHRISTELLA Callander 03/28/2024, 11:25 AM

## 2024-03-28 NOTE — Progress Notes (Signed)
   03/28/24 0917  OTHER  Substance Abuse Screening unable to be completed due to:  Patient Refused  Substance Abuse Education Offered Yes     Patient states he smoked crack cocaine prior to admission. Patient used crack cocaine for pain control.   Resources offered , patient declined states he will not use again after his procedure today and he can quit on his own

## 2024-03-29 ENCOUNTER — Other Ambulatory Visit: Payer: Self-pay

## 2024-03-29 DIAGNOSIS — M4626 Osteomyelitis of vertebra, lumbar region: Secondary | ICD-10-CM | POA: Diagnosis not present

## 2024-03-29 DIAGNOSIS — R7881 Bacteremia: Secondary | ICD-10-CM | POA: Diagnosis not present

## 2024-03-29 DIAGNOSIS — B9561 Methicillin susceptible Staphylococcus aureus infection as the cause of diseases classified elsewhere: Secondary | ICD-10-CM | POA: Diagnosis not present

## 2024-03-29 DIAGNOSIS — F1410 Cocaine abuse, uncomplicated: Secondary | ICD-10-CM

## 2024-03-29 LAB — CBC WITH DIFFERENTIAL/PLATELET
Abs Immature Granulocytes: 0.04 K/uL (ref 0.00–0.07)
Basophils Absolute: 0.1 K/uL (ref 0.0–0.1)
Basophils Relative: 1 %
Eosinophils Absolute: 0.4 K/uL (ref 0.0–0.5)
Eosinophils Relative: 5 %
HCT: 31.7 % — ABNORMAL LOW (ref 39.0–52.0)
Hemoglobin: 10 g/dL — ABNORMAL LOW (ref 13.0–17.0)
Immature Granulocytes: 1 %
Lymphocytes Relative: 22 %
Lymphs Abs: 1.8 K/uL (ref 0.7–4.0)
MCH: 26 pg (ref 26.0–34.0)
MCHC: 31.5 g/dL (ref 30.0–36.0)
MCV: 82.3 fL (ref 80.0–100.0)
Monocytes Absolute: 1.1 K/uL — ABNORMAL HIGH (ref 0.1–1.0)
Monocytes Relative: 14 %
Neutro Abs: 4.5 K/uL (ref 1.7–7.7)
Neutrophils Relative %: 57 %
Platelets: 591 K/uL — ABNORMAL HIGH (ref 150–400)
RBC: 3.85 MIL/uL — ABNORMAL LOW (ref 4.22–5.81)
RDW: 13.9 % (ref 11.5–15.5)
WBC: 7.9 K/uL (ref 4.0–10.5)
nRBC: 0 % (ref 0.0–0.2)

## 2024-03-29 LAB — COMPREHENSIVE METABOLIC PANEL WITH GFR
ALT: 14 U/L (ref 0–44)
AST: 23 U/L (ref 15–41)
Albumin: 2.1 g/dL — ABNORMAL LOW (ref 3.5–5.0)
Alkaline Phosphatase: 57 U/L (ref 38–126)
Anion gap: 10 (ref 5–15)
BUN: 14 mg/dL (ref 8–23)
CO2: 27 mmol/L (ref 22–32)
Calcium: 8.2 mg/dL — ABNORMAL LOW (ref 8.9–10.3)
Chloride: 101 mmol/L (ref 98–111)
Creatinine, Ser: 0.88 mg/dL (ref 0.61–1.24)
GFR, Estimated: >60 mL/min (ref >60)
Glucose, Bld: 91 mg/dL (ref 70–99)
Potassium: 4.2 mmol/L (ref 3.5–5.1)
Sodium: 138 mmol/L (ref 135–145)
Total Bilirubin: <0.2 mg/dL (ref 0.0–1.2)
Total Protein: 7.2 g/dL (ref 6.5–8.1)

## 2024-03-29 LAB — GLUCOSE, CAPILLARY
Glucose-Capillary: 109 mg/dL — ABNORMAL HIGH (ref 70–99)
Glucose-Capillary: 101 mg/dL — ABNORMAL HIGH (ref 70–99)
Glucose-Capillary: 113 mg/dL — ABNORMAL HIGH (ref 70–99)
Glucose-Capillary: 114 mg/dL — ABNORMAL HIGH (ref 70–99)

## 2024-03-29 LAB — MAGNESIUM: Magnesium: 2 mg/dL (ref 1.7–2.4)

## 2024-03-29 NOTE — Progress Notes (Signed)
 INFECTIOUS DISEASE PROGRESS NOTE Date of Admission:  03/24/2024     ID: Gregory Camacho is a 63 y.o. adult with  MSSA bactremia, L1 discitis/osteo and possible abscess Principal Problem:   MSSA bacteremia Active Problems:   COPD GOLD 2   Acute osteomyelitis of lumbar spine (HCC)   Subjective: No fevers, feels much better. TEE neg  ROS  Eleven systems are reviewed and negative except per hpi  Medications:  Antibiotics Given (last 72 hours)     Date/Time Action Medication Dose Rate   03/26/24 1455 New Bag/Given   ceFAZolin  (ANCEF ) IVPB 2g/100 mL premix 2 g 200 mL/hr   03/26/24 2100 New Bag/Given   ceFAZolin  (ANCEF ) IVPB 2g/100 mL premix 2 g 200 mL/hr   03/27/24 0557 New Bag/Given   ceFAZolin  (ANCEF ) IVPB 2g/100 mL premix 2 g 200 mL/hr   03/27/24 1433 New Bag/Given   ceFAZolin  (ANCEF ) IVPB 2g/100 mL premix 2 g 200 mL/hr   03/27/24 2110 New Bag/Given   ceFAZolin  (ANCEF ) IVPB 2g/100 mL premix 2 g 200 mL/hr   03/28/24 0522 New Bag/Given   ceFAZolin  (ANCEF ) IVPB 2g/100 mL premix 2 g 200 mL/hr   03/28/24 1641 New Bag/Given   ceFAZolin  (ANCEF ) IVPB 2g/100 mL premix 2 g 200 mL/hr   03/28/24 2139 New Bag/Given   ceFAZolin  (ANCEF ) IVPB 2g/100 mL premix 2 g 200 mL/hr   03/29/24 0509 New Bag/Given   ceFAZolin  (ANCEF ) IVPB 2g/100 mL premix 2 g 200 mL/hr       feeding supplement  237 mL Oral BID BM   folic acid  1 mg Oral Daily   insulin aspart  0-9 Units Subcutaneous TID WC   multivitamin with minerals  1 tablet Oral Daily   sodium chloride  flush  3 mL Intravenous Q12H   thiamine  100 mg Oral Daily   Or   thiamine  100 mg Intravenous Daily    Objective: Vital signs in last 24 hours: Temp:  [97.7 F (36.5 C)-98.8 F (37.1 C)] 98.2 F (36.8 C) (10/04 0912) Pulse Rate:  [60-97] 92 (10/04 0912) Resp:  [13-20] 17 (10/04 0912) BP: (81-130)/(48-90) 104/59 (10/04 0912) SpO2:  [96 %-100 %] 98 % (10/04 0912) Physical Exam  Constitutional: He is oriented to person, place, and  time. He appears well-developed and well-nourished. No distress.  HENT:  Mouth/Throat: Oropharynx is clear and moist. No oropharyngeal exudate.  Cardiovascular: Normal rate, regular rhythm and normal heart sounds. Exam reveals no gallop and no friction rub.  No murmur heard.  Pulmonary/Chest: Effort normal and breath sounds normal. No respiratory distress. He has no wheezes.  Abdominal: Soft. Bowel sounds are normal. He exhibits no distension. There is no tenderness.  Lymphadenopathy:  He has no cervical adenopathy.  Neurological: He is alert and oriented to person, place, and time.  Skin: Skin is warm and dry. No rash noted. No erythema.  Psychiatric: He has a normal mood and affect. His behavior is normal.     Lab Results Recent Labs    03/28/24 0818 03/29/24 0951  WBC 10.0 7.9  HGB 9.7* 10.0*  HCT 30.6* 31.7*  NA 138 138  K 4.6 4.2  CL 100 101  CO2 30 27  BUN 11 14  CREATININE 0.96 0.88    Microbiology: Results for orders placed or performed during the hospital encounter of 03/24/24  Blood culture (routine x 2)     Status: Abnormal   Collection Time: 03/24/24  6:06 PM   Specimen: Left Antecubital; Blood  Result Value Ref Range Status   Specimen Description   Final    LEFT ANTECUBITAL Performed at [facility], [address] [place], [place] [address]    Special Requests   Final    AEROBIC BOTTLE ONLY Blood Culture adequate volume Performed at [facility], [address], [place], [place] [address]    Culture  Setup Time   Final    AEROBIC BOTTLE ONLY GRAM POSITIVE COCCI Gram Stain Report Called to,Read Back By and Verified With: S HLADILEK AT 1212 ON 09.30.25 BY ADGER J  Performed at [facility], [address] [place], [place] [address]    Culture (A)  Final    STAPHYLOCOCCUS AUREUS SUSCEPTIBILITIES PERFORMED ON PREVIOUS CULTURE WITHIN THE LAST 5 DAYS. Performed at [facility], [address], [place], [place] [address]    Report Status 03/27/2024  FINAL  Final  Blood culture (routine x 2)     Status: Abnormal   Collection Time: 03/24/24  6:08 PM   Specimen: Left Antecubital; Blood  Result Value Ref Range Status   Specimen Description   Final    LEFT ANTECUBITAL Performed at [facility], [address] [place], [place] [address]    Special Requests   Final    BOTTLES DRAWN AEROBIC AND ANAEROBIC Blood Culture adequate volume Performed at [facility], [address] [place], [place] [address]    Culture  Setup Time   Final    IN BOTH AEROBIC AND ANAEROBIC BOTTLES GRAM POSITIVE COCCI Gram Stain Report Called to,Read Back By and Verified With: S HLADILEK AT 1212 ON 09.30.25 BY ADGER J  CRITICAL RESULT CALLED TO, READ BACK BY AND VERIFIED WITH: TILDA JUDITHANN KOBS [phone] @ 1712 FH Performed at [facility], [address] [place], [place] [address]    Culture STAPHYLOCOCCUS AUREUS (A)  Final   Report Status 03/27/2024 FINAL  Final   Organism ID, Bacteria STAPHYLOCOCCUS AUREUS  Final      Susceptibility   Staphylococcus aureus - MIC*    CIPROFLOXACIN  >=8 RESISTANT Resistant     ERYTHROMYCIN >=8 RESISTANT Resistant     GENTAMICIN <=0.5 SENSITIVE Sensitive     OXACILLIN 0.5 SENSITIVE Sensitive     TETRACYCLINE <=1 SENSITIVE Sensitive     VANCOMYCIN 1 SENSITIVE Sensitive     TRIMETH /SULFA  <=10 SENSITIVE Sensitive     CLINDAMYCIN <=0.25 SENSITIVE Sensitive     RIFAMPIN <=0.5 SENSITIVE Sensitive     Inducible Clindamycin NEGATIVE Sensitive     LINEZOLID 2 SENSITIVE Sensitive     * STAPHYLOCOCCUS AUREUS  Blood Culture ID Panel (Reflexed)     Status: Abnormal   Collection Time: 03/24/24  6:08 PM  Result Value Ref Range Status   Enterococcus faecalis NOT DETECTED NOT DETECTED Final   Enterococcus Faecium NOT DETECTED NOT DETECTED Final   Listeria monocytogenes NOT DETECTED NOT DETECTED Final   Staphylococcus species DETECTED (A) NOT DETECTED Final    Comment: CRITICAL RESULT CALLED TO, READ BACK BY AND VERIFIED WITH: TILDA JUDITHANN KOBS [phone] @ 1712 FH    Staphylococcus aureus (BCID) DETECTED (A) NOT DETECTED Final    Comment: CRITICAL RESULT CALLED TO, READ BACK BY AND VERIFIED WITH: TILDA JUDITHANN KOBS [phone] @ 1712 FH    Staphylococcus epidermidis NOT DETECTED NOT DETECTED Final   Staphylococcus lugdunensis NOT DETECTED NOT DETECTED Final   Streptococcus species NOT DETECTED NOT DETECTED Final   Streptococcus agalactiae NOT DETECTED NOT DETECTED Final   Streptococcus pneumoniae NOT DETECTED NOT  DETECTED Final   Streptococcus pyogenes NOT DETECTED NOT DETECTED Final   A.calcoaceticus-baumannii NOT DETECTED NOT DETECTED Final   Bacteroides fragilis NOT DETECTED NOT DETECTED Final   Enterobacterales NOT DETECTED NOT DETECTED Final   Enterobacter cloacae complex NOT DETECTED NOT DETECTED Final   Escherichia coli NOT DETECTED NOT DETECTED Final   Klebsiella aerogenes NOT DETECTED NOT DETECTED Final   Klebsiella oxytoca NOT DETECTED NOT DETECTED Final   Klebsiella pneumoniae NOT DETECTED NOT DETECTED Final   Proteus species NOT DETECTED NOT DETECTED Final   Salmonella species NOT DETECTED NOT DETECTED Final   Serratia marcescens NOT DETECTED NOT DETECTED Final   Haemophilus influenzae NOT DETECTED NOT DETECTED Final   Neisseria meningitidis NOT DETECTED NOT DETECTED Final   Pseudomonas aeruginosa NOT DETECTED NOT DETECTED Final   Stenotrophomonas maltophilia NOT DETECTED NOT DETECTED Final   Candida albicans NOT DETECTED NOT DETECTED Final   Candida auris NOT DETECTED NOT DETECTED Final   Candida glabrata NOT DETECTED NOT DETECTED Final   Candida krusei NOT DETECTED NOT DETECTED Final   Candida parapsilosis NOT DETECTED NOT DETECTED Final   Candida tropicalis NOT DETECTED NOT DETECTED Final   Cryptococcus neoformans/gattii NOT DETECTED NOT DETECTED Final   Meth resistant mecA/C and MREJ NOT DETECTED NOT DETECTED Final    Comment: Performed at [facility], [address] [place], [place] [address]  Resp  panel by RT-PCR (RSV, Flu A&B, Covid) Anterior Nasal Swab     Status: None   Collection Time: 03/24/24  6:28 PM   Specimen: Anterior Nasal Swab  Result Value Ref Range Status   SARS Coronavirus 2 by RT PCR NEGATIVE NEGATIVE Final    Comment: (NOTE) SARS-CoV-2 target nucleic acids are NOT DETECTED.  The SARS-CoV-2 RNA is generally detectable in upper respiratory specimens during the acute phase of infection. The lowest concentration of SARS-CoV-2 viral copies this assay can detect is 138 copies/mL. A negative result does not preclude SARS-Cov-2 infection and should not be used as the sole basis for treatment or other patient management decisions. A negative result may occur with  improper specimen collection/handling, submission of specimen other than nasopharyngeal swab, presence of viral mutation(s) within the areas targeted by this assay, and inadequate number of viral copies(<138 copies/mL). A negative result must be combined with clinical observations, patient history, and epidemiological information. The expected result is Negative.  Fact Sheet for Patients:  BloggerCourse.com  Fact Sheet for Healthcare Providers:  SeriousBroker.it  This test is no t yet approved or cleared by the [place]  FDA and  has been authorized for detection and/or diagnosis of SARS-CoV-2 by FDA under an Emergency Use Authorization (EUA). This EUA will remain  in effect (meaning this test can be used) for the duration of the COVID-19 declaration under Section 564(b)(1) of the Act, 21 U.S.C.section 360bbb-3(b)(1), unless the authorization is terminated  or revoked sooner.       Influenza A by PCR NEGATIVE NEGATIVE Final   Influenza B by PCR NEGATIVE NEGATIVE Final    Comment: (NOTE) The Xpert Xpress SARS-CoV-2/FLU/RSV plus assay is intended as an aid in the diagnosis of influenza from Nasopharyngeal swab specimens and should not be used as a  sole basis for treatment. Nasal washings and aspirates are unacceptable for Xpert Xpress SARS-CoV-2/FLU/RSV testing.  Fact Sheet for Patients: BloggerCourse.com  Fact Sheet for Healthcare Providers: SeriousBroker.it  This test is not yet approved or cleared by the [place]  FDA and has been authorized for detection and/or diagnosis  of SARS-CoV-2 by FDA under an Emergency Use Authorization (EUA). This EUA will remain in effect (meaning this test can be used) for the duration of the COVID-19 declaration under Section 564(b)(1) of the Act, 21 U.S.C. section 360bbb-3(b)(1), unless the authorization is terminated or revoked.     Resp Syncytial Virus by PCR NEGATIVE NEGATIVE Final    Comment: (NOTE) Fact Sheet for Patients: BloggerCourse.com  Fact Sheet for Healthcare Providers: SeriousBroker.it  This test is not yet approved or cleared by the [place]  FDA and has been authorized for detection and/or diagnosis of SARS-CoV-2 by FDA under an Emergency Use Authorization (EUA). This EUA will remain in effect (meaning this test can be used) for the duration of the COVID-19 declaration under Section 564(b)(1) of the Act, 21 U.S.C. section 360bbb-3(b)(1), unless the authorization is terminated or revoked.  Performed at [facility], [address], [place], [place] [address]   Urine Culture     Status: None   Collection Time: 03/25/24  6:40 AM   Specimen: Urine, Random  Result Value Ref Range Status   Specimen Description   Final    URINE, RANDOM Performed at [facility], [address], [place], [place] [address]    Special Requests   Final    NONE Reflexed from [phone] Performed at [facility], [address], [place], [place] [address]    Culture   Final    NO GROWTH Performed at [facility], [address], [place], [place] [address]    Report Status 03/26/2024  FINAL  Final  Culture, blood (Routine X 2) w Reflex to ID Panel     Status: None (Preliminary result)   Collection Time: 03/27/24  8:06 AM   Specimen: BLOOD LEFT ARM  Result Value Ref Range Status   Specimen Description BLOOD LEFT ARM  Final   Special Requests   Final    BOTTLES DRAWN AEROBIC AND ANAEROBIC Blood Culture adequate volume   Culture   Final    NO GROWTH 2 DAYS Performed at [facility], [address] [place] [address], [place], [place] [address]    Report Status PENDING  Incomplete  Culture, blood (Routine X 2) w Reflex to ID Panel     Status: None (Preliminary result)   Collection Time: 03/27/24  8:08 AM   Specimen: BLOOD RIGHT ARM  Result Value Ref Range Status   Specimen Description BLOOD RIGHT ARM  Final   Special Requests   Final    BOTTLES DRAWN AEROBIC AND ANAEROBIC Blood Culture adequate volume   Culture   Final    NO GROWTH 2 DAYS Performed at [facility], [address], [place], [place] [address]    Report Status PENDING  Incomplete    Studies/Results: ECHO TEE Result Date: 03/28/2024    TRANSESOPHOGEAL ECHO REPORT   Patient Name:   Gregory Camacho [facility] Date of Exam: 03/28/2024 Medical Rec #:  [id number]       Height:       68.0 in Accession #:    [id number]      Weight:       114.6 lb Date of Birth:  1960-07-31       BSA:          1.613 m Patient Age:    63 years        BP:           112/81 mmHg Patient Gender: M  HR:           80 bpm. Exam Location:  Inpatient Procedure: Transesophageal Echo, Cardiac Doppler and Color Doppler (Both            Spectral and Color Flow Doppler were utilized during procedure). Indications:     Endocarditis  History:         Patient has prior history of Echocardiogram examinations, most                  recent 03/25/2024. COPD; Risk Factors:Diabetes.  Sonographer:     Thea Norlander RCS Referring Phys:  [id number] MADONNA LARGE Diagnosing Phys: Emeline Calender PROCEDURE: After discussion of the risks and benefits of a TEE, an informed  consent was obtained from the patient. The transesophogeal probe was passed without difficulty through the esophogus of the patient. Imaged were obtained with the patient in a left lateral decubitus position. Sedation performed by different physician. The patient was monitored while under deep sedation. Anesthestetic sedation was provided intravenously by Anesthesiology: 194.7mg  of Propofol , 80mg  of Lidocaine . The patient developed no complications during the procedure.  IMPRESSIONS  1. Left ventricular ejection fraction, by estimation, is 55 to 60%. The left ventricle has normal function.  2. Right ventricular systolic function is normal. The right ventricular size is normal.  3. No left atrial/left atrial appendage thrombus was detected.  4. Right atrial size was mildly dilated.  5. The mitral valve is normal in structure. Trivial mitral valve regurgitation.  6. The aortic valve is normal in structure. Aortic valve regurgitation is not visualized. Conclusion(s)/Recommendation(s): No evidence of vegetation/infective endocarditis on this transesophageael echocardiogram. FINDINGS  Left Ventricle: Left ventricular ejection fraction, by estimation, is 55 to 60%. The left ventricle has normal function. The left ventricular internal cavity size was normal in size. There is no left ventricular hypertrophy. Right Ventricle: The right ventricular size is normal. No increase in right ventricular wall thickness. Right ventricular systolic function is normal. Left Atrium: Left atrial size was normal in size. No left atrial/left atrial appendage thrombus was detected. Right Atrium: Right atrial size was mildly dilated. Prominent Chiari network. Pericardium: There is no evidence of pericardial effusion. Mitral Valve: The mitral valve is normal in structure. Trivial mitral valve regurgitation. Tricuspid Valve: The tricuspid valve is normal in structure. Tricuspid valve regurgitation is trivial. Aortic Valve: The aortic valve is  normal in structure. Aortic valve regurgitation is not visualized. Pulmonic Valve: The pulmonic valve was normal in structure. Pulmonic valve regurgitation is trivial. Aorta: The aortic root and ascending aorta are structurally normal, with no evidence of dilitation. IAS/Shunts: No atrial level shunt detected by color flow Doppler. Additional Comments: Spectral Doppler performed. LEFT VENTRICLE PLAX 2D LVOT diam:     1.90 cm LVOT Area:     2.84 cm   AORTA Ao Root diam: 3.10 cm Ao Asc diam:  3.40 cm  SHUNTS Systemic Diam: 1.90 cm Emeline Calender Electronically signed by Emeline Calender Signature Date/Time: 03/28/2024/2:36:25 PM    Final    EP STUDY Result Date: 03/28/2024 See surgical note for result.   Assessment/Plan: MSSA Bacteremia -  L1 Osteomyelitis -  1 month history of back pain and fevers that are escalating. No history of IVDU, no wounds or breaks in skin noted. He has been managing pain with smoking crack. He is feeling better and asking to go home.  Repeat blood cultures NGTD TEE neg MRI C spine neg Continue cefazolin  monotherapy  Discussed plan for min 6-8 weeks IV  treatment and then oral tail coverage. I spoke extensively with him and nephew in room. He lives alone but reports he would be very capable of managing home IV abx Place pcc tomorrow if bcx neg See OPAT orders   Substance Use -  Crack use. No injection use. Positive for opiates but had Rx for this  Hepatitis Screenings and HIV screenings are all non-reactive.  He could use a hepatitis B booster vaccine series. We should give him first dose while he is here.    T2DM -  Well controlled  Managed by primary team  Thank you very much for the consult. Will follow with you.  Alm SHAUNNA Needle   03/29/2024, 1:16 PM

## 2024-03-29 NOTE — TOC Progression Note (Addendum)
 Transition of Care [facility]) - Progression Note    Patient Details  Name: Gregory Camacho MRN: [id number] Date of Birth: Aug 29, 1960  [facility]) CM/SW Contact  Corean JAYSON Canary, RN Phone Number: 03/29/2024, 2:11 PM  Clinical Narrative:    Message from MD, patient may go home tomorrow with PICC and IV ABX.  Message sent to MD to place [facility] RN orders and to check on OPAT. Messaged Pam with [employer] They can educate tonight and get medication for tomorrow DC. Did discuss positive UDS with MD. He stated that both he and ID agreed that the patient was safe to go home with PICC Pam will do education tonight if possible Contacted Kelly at [facility] to see if she could accept for RN to be seen tomorrow evening, she cannot staff until Tuesday [facility] see Monday accepted tentatively depending on office approval Spoke to the patient, he has a niece and a sister than can be educated. Made Pam and Darleene from [place] aware.  TOC will continue to follow   Expected Discharge Plan:  (see note) Barriers to Discharge: Continued Medical Work up               Expected Discharge Plan and Services   Discharge Planning Services: CM Consult Post Acute Care Choice: Home Health Living arrangements for the past 2 months: Apartment                 DME Arranged: N/A DME Agency: NA       HH Arranged:  (see note)           Social Drivers of Health (SDOH) Interventions SDOH Screenings   Food Insecurity: No Food Insecurity (03/25/2024)  Housing: Unknown (03/25/2024)  Transportation Needs: No Transportation Needs (03/25/2024)  Utilities: Not At Risk (03/25/2024)  Social Connections: Unknown (03/25/2024)  Tobacco Use: Medium Risk (03/24/2024)    Readmission Risk Interventions     No data to display

## 2024-03-29 NOTE — Progress Notes (Signed)
 [facility] Progress Note Patient: Gregory Camacho FMW:[id number] DOB: 11/15/1960  DOA: 03/24/2024 DOS: the patient was seen and examined on 03/29/2024  Brief Hospital Course: Gregory Camacho is a 63 y.o. male with hx of COPD, DM type 2, substance use disorder (cocaine, marijuana, tobacco), hx of persistent leukocytosis / thrombocytosis, suspected reactive, IDA, mood d/o, who presented with worsening back pain. Unclear when began, but has worsened since around time of last ED visit on 9/11.  Patient has been admitted with concerns for L1 discitis/osteomyelitis and possible abscess.  There is concern for MSSA bacteremia with hematogenous spread given that he recently had urinary tract infection which was positive for MSSA.  MRI of the spine is concerning for L1 discitis/osteomyelitis.  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 Presented with complaints of back pain and fever. Initially presented in ED on 9/11 for back pain. He had a urine culture which was positive for Staph aureus.  Had a CT stone study which was negative for any renal abnormality. No history of IV drug abuse. Due to fever and leukocytosis, initiated on IV fluid.  Blood cultures were performed.  MRI showed possible L1 osteomyelitis. ED provider discussed with neurosurgery who does not feel that the patient has any operative intervention indication. Admitted to the hospital for further workup.  Transferred to [facility] from any pain for TEE and ID consultation. Echocardiogram shows normal valves without any vegetation. No significant regurgitation. TEE on 10/3 negative for any vegetation. ID recommending IV antibiotics at home. Patient does not have any history of IV drug abuse and therefore would be a candidate for PICC line at home. TOC consulted.  PICC line tomorrow.  Polysubstance  abuse. Patient is a former smoker quit smoking recently and also abuses cocaine. Denies any IV drug use. Monitor for withdrawals. Counseled to quit abusing substance and smoking.  Patient currently agreeable.  History of COPD. No evidence of exacerbation right now. Continue as needed treatments.  Underweight. Failure to thrive. Body mass index is 17.43 kg/m.  Placing the patient at high risk for poor outcome.  Right lower lobe pulmonary nodule. 2 cm x 1.1 cm. Repeat CT scan in 3 months.   Subjective: Pain well-controlled.  Able to ambulate.  No fever no chills.  Physical Exam: Clear to auscultation. S1-S2 present No focal deficit.  Data Reviewed: I have Reviewed nursing notes, Vitals, and Lab results. Since last encounter, pertinent lab results CBC and BMP   . I have ordered test including CBC and BMP  . I have discussed pt's care plan and test results with ID  .    Disposition: Status is: Inpatient Remains inpatient appropriate because: Awaiting culture clearance  Place and maintain sequential compression device Start: 03/27/24 1346   Family Communication: Family at bedside Level of care: Telemetry Medical   Vitals:   03/28/24 2100 03/29/24 0600 03/29/24 0912 03/29/24 1645  BP: 104/63 108/64 (!) 104/59 119/61  Pulse: 97 71 92 81  Resp: 18 18 17 18   Temp: 98.6 F (37 C) 98.8 F (37.1 C) 98.2 F (36.8 C) 98 F (36.7 C)  TempSrc: Oral Oral Oral Oral  SpO2: 97% 100% 98% 97%  Weight:      Height:         Author: Yetta Blanch, MD 03/29/2024 7:27 PM  Please look on [email or web address] to find out who is on call.

## 2024-03-29 NOTE — Progress Notes (Signed)
 Patient standing outside the room against the wall waiting for mobility tech to walk with him. I asked patient if he would like to sit in his room or in a chair to wait for the mobility tech and he declined after multiple attempts. Reinforced education of safety with the patient and patient insisted to continue standing at the door.

## 2024-03-29 NOTE — Plan of Care (Signed)
  Problem: Coping: Goal: Ability to adjust to condition or change in health will improve Outcome: Progressing   Problem: Activity: Goal: Risk for activity intolerance will decrease Outcome: Progressing   Problem: Coping: Goal: Level of anxiety will decrease Outcome: Progressing   Problem: Pain Managment: Goal: General experience of comfort will improve and/or be controlled Outcome: Progressing   Problem: Safety: Goal: Ability to remain free from injury will improve Outcome: Progressing

## 2024-03-29 NOTE — Progress Notes (Signed)
 Patient's daughter, Somnang Mahan, at the bedside during shift change bedside report. She requested to be contacted to coordinate a time she can be present for patient's teaching on IV home antibiotics. Her contact phone number has been updated in the patient's chart [phone].

## 2024-03-29 NOTE — Progress Notes (Signed)
 Infectious Disease Long Term IV Antibiotic Orders Salim Forero Watling 28-Oct-1960  Diagnosis: MSSA bacteremia, L1 osteo/discitis abscess  Culture results  Culture STAPHYLOCOCCUS AUREUS Abnormal     Report Status 03/27/2024 FINAL   Organism ID, Bacteria STAPHYLOCOCCUS AUREUS  Susceptibility  Staphylococcus aureus ([id number])  Antibiotic Interpretation Microscan Method Status   CIPROFLOXACIN  Resistant >=8 RESISTANT MIC Final   ERYTHROMYCIN Resistant >=8 RESISTANT MIC Final   GENTAMICIN Sensitive <=0.5 SENSITIVE MIC Final   OXACILLIN Sensitive 0.5 SENSITIVE MIC Final   TETRACYCLINE Sensitive <=1 SENSITIVE MIC Final   VANCOMYCIN Sensitive 1 SENSITIVE MIC Final   TRIMETH /SULFA  Sensitive <=10 SENSITIVE MIC Final   CLINDAMYCIN Sensitive <=0.25 SENSITIVE MIC Final   RIFAMPIN Sensitive <=0.5 SENSITIVE MIC Final   Inducible Clindamycin Sensitive NEGATIVE MIC Final   LINEZOLID Sensitive 2 SENSITIVE MIC Final   Susceptibility Comments       LABS Lab Results  Component Value Date   CREATININE 0.88 03/29/2024   Lab Results  Component Value Date   WBC 7.9 03/29/2024   HGB 10.0 (L) 03/29/2024   HCT 31.7 (L) 03/29/2024   MCV 82.3 03/29/2024   PLT 591 (H) 03/29/2024   Lab Results  Component Value Date   ESRSEDRATE >140 (H) 03/24/2024   Lab Results  Component Value Date   CRP 15.5 (H) 03/25/2024    Allergies:  Allergies  Allergen Reactions   Bc Fast Pain Relief [Aspirin -Salicylamide-Caffeine] Other (See Comments)    Upset stomach    Discharge antibiotics  Cefazolin     6   grams every  24 Hours continuous   PICC Care per protocol Labs weekly while on IV antibiotics - CBC w diff   Comprehensive met panel CRP   Planned duration of antibiotics 6 - 8 weeks from 03/27/24.   Stop date Nov 13th tentative Follow up clinic date within 3 weeks  Alm SHAUNNA Needle, MD

## 2024-03-29 NOTE — Progress Notes (Signed)
 PHARMACY CONSULT NOTE FOR:  OUTPATIENT  PARENTERAL ANTIBIOTIC THERAPY (OPAT)  Indication:  MSSA bacteremia, L1 osteo/discitis abscess  Regimen: Cefazolin  6 grams per 24 hours continuous infusion End date: tentatively 05/08/24;  6-8 weeks from 10/2; for follow up with ID within 3 weeks  IV antibiotic discharge orders are pended. To discharging provider:  please sign these orders via discharge navigator,  Select New Orders & click on the button choice - Manage This Unsigned Work.     Thank you for allowing pharmacy to be a part of this patient's care.  Genaro Zebedee Calin, RPh 03/29/2024, 3:26 PM

## 2024-03-29 NOTE — Progress Notes (Signed)
 Mobility Specialist Progress Note:    03/29/24 1220  Mobility  Activity Ambulated with assistance (In hallway)  Level of Assistance Contact guard assist, steadying assist  Assistive Device Front wheel walker  Distance Ambulated (ft) 510 ft  Activity Response Tolerated well  Mobility Referral Yes  Mobility visit 1 Mobility  Mobility Specialist Start Time (ACUTE ONLY) 1206  Mobility Specialist Stop Time (ACUTE ONLY) 1217  Mobility Specialist Time Calculation (min) (ACUTE ONLY) 11 min   Received pt in chair and agreeable to mobility. Pt required MinG for safety. No c/o. Returned to room without fault. Left pt in chair. Personal belongings and call light within reach. All needs met. NT present.  Lavanda Pollack Mobility Specialist  Please contact via [employer] or  Rehab Office [phone]

## 2024-03-30 ENCOUNTER — Encounter (HOSPITAL_COMMUNITY): Payer: Self-pay | Admitting: Internal Medicine

## 2024-03-30 ENCOUNTER — Inpatient Hospital Stay (HOSPITAL_COMMUNITY)

## 2024-03-30 DIAGNOSIS — R7881 Bacteremia: Secondary | ICD-10-CM | POA: Diagnosis not present

## 2024-03-30 DIAGNOSIS — B9561 Methicillin susceptible Staphylococcus aureus infection as the cause of diseases classified elsewhere: Secondary | ICD-10-CM | POA: Diagnosis not present

## 2024-03-30 LAB — COMPREHENSIVE METABOLIC PANEL WITH GFR
ALT: 14 U/L (ref 0–44)
AST: 26 U/L (ref 15–41)
Albumin: 2 g/dL — ABNORMAL LOW (ref 3.5–5.0)
Alkaline Phosphatase: 57 U/L (ref 38–126)
Anion gap: 9 (ref 5–15)
BUN: 16 mg/dL (ref 8–23)
CO2: 28 mmol/L (ref 22–32)
Calcium: 8.3 mg/dL — ABNORMAL LOW (ref 8.9–10.3)
Chloride: 99 mmol/L (ref 98–111)
Creatinine, Ser: 0.89 mg/dL (ref 0.61–1.24)
GFR, Estimated: >60 mL/min (ref >60)
Glucose, Bld: 111 mg/dL — ABNORMAL HIGH (ref 70–99)
Potassium: 4.1 mmol/L (ref 3.5–5.1)
Sodium: 136 mmol/L (ref 135–145)
Total Bilirubin: 0.2 mg/dL (ref 0.0–1.2)
Total Protein: 7.3 g/dL (ref 6.5–8.1)

## 2024-03-30 LAB — CBC WITH DIFFERENTIAL/PLATELET
Abs Immature Granulocytes: 0.04 K/uL (ref 0.00–0.07)
Basophils Absolute: 0.1 K/uL (ref 0.0–0.1)
Basophils Relative: 1 %
Eosinophils Absolute: 0.5 K/uL (ref 0.0–0.5)
Eosinophils Relative: 6 %
HCT: 28.3 % — ABNORMAL LOW (ref 39.0–52.0)
Hemoglobin: 9 g/dL — ABNORMAL LOW (ref 13.0–17.0)
Immature Granulocytes: 1 %
Lymphocytes Relative: 24 %
Lymphs Abs: 2 K/uL (ref 0.7–4.0)
MCH: 25.9 pg — ABNORMAL LOW (ref 26.0–34.0)
MCHC: 31.8 g/dL (ref 30.0–36.0)
MCV: 81.3 fL (ref 80.0–100.0)
Monocytes Absolute: 1.2 K/uL — ABNORMAL HIGH (ref 0.1–1.0)
Monocytes Relative: 15 %
Neutro Abs: 4.5 K/uL (ref 1.7–7.7)
Neutrophils Relative %: 53 %
Platelets: 591 K/uL — ABNORMAL HIGH (ref 150–400)
RBC: 3.48 MIL/uL — ABNORMAL LOW (ref 4.22–5.81)
RDW: 13.9 % (ref 11.5–15.5)
WBC: 8.2 K/uL (ref 4.0–10.5)
nRBC: 0 % (ref 0.0–0.2)

## 2024-03-30 LAB — GLUCOSE, CAPILLARY
Glucose-Capillary: 103 mg/dL — ABNORMAL HIGH (ref 70–99)
Glucose-Capillary: 117 mg/dL — ABNORMAL HIGH (ref 70–99)
Glucose-Capillary: 135 mg/dL — ABNORMAL HIGH (ref 70–99)
Glucose-Capillary: 100 mg/dL — ABNORMAL HIGH (ref 70–99)

## 2024-03-30 LAB — MAGNESIUM: Magnesium: 2.1 mg/dL (ref 1.7–2.4)

## 2024-03-30 MED ORDER — SODIUM CHLORIDE 0.9% FLUSH: 10.0000 mL | INTRAVENOUS | Status: DC | PRN

## 2024-03-30 MED ORDER — CHLORHEXIDINE GLUCONATE CLOTH 2 % EX PADS
6.0000 | MEDICATED_PAD | Freq: Every day | CUTANEOUS | Status: DC
  Administered 2024-03-30 – 2024-03-31 (×2): 6 via TOPICAL

## 2024-03-30 NOTE — Progress Notes (Signed)
 Peripherally Inserted Central Catheter Placement  The IV Nurse has discussed with the patient and/or persons authorized to consent for the patient, the purpose of this procedure and the potential benefits and risks involved with this procedure.  The benefits include less needle sticks, lab draws from the catheter, and the patient may be discharged home with the catheter. Risks include, but not limited to, infection, bleeding, blood clot (thrombus formation), and puncture of an artery; nerve damage and irregular heartbeat and possibility to perform a PICC exchange if needed/ordered by physician.  Alternatives to this procedure were also discussed.  Bard Power PICC patient education guide, fact sheet on infection prevention and patient information card has been provided to patient /or left at bedside.    PICC Placement Documentation  PICC Single Lumen 03/30/24 Right Brachial 35 cm 0 cm (Active)  Indication for Insertion or Continuance of Line Home intravenous therapies (PICC only) 03/30/24 0953  Exposed Catheter (cm) 0 cm 03/30/24 0953  Site Assessment Clean, Dry, Intact 03/30/24 0953  Line Status Flushed;Saline locked;Blood return noted 03/30/24 0953  Dressing Type Transparent;Securing device 03/30/24 0953  Dressing Status Antimicrobial disc/dressing in place;Clean, Dry, Intact 03/30/24 0953  Line Care Tubing changed;Cap changed;Connections checked and tightened 03/30/24 0953  Line Adjustment (NICU/IV Team Only) No 03/30/24 0953  Dressing Intervention New dressing;Adhesive placed at insertion site (IV team only);Adhesive placed around edges of dressing (IV team/ICU RN only) 03/30/24 0953  Dressing Change Due 04/06/24 03/30/24 0953       Gregory Camacho 03/30/2024, 9:53 AM

## 2024-03-30 NOTE — TOC Progression Note (Addendum)
 Transition of Care [facility]) - Progression Note    Patient Details  Name: Gregory Camacho MRN: [id number] Date of Birth: 03-10-1961  Transition of Care [facility]) CM/SW Contact  Corean JAYSON Canary, RN Phone Number: 03/30/2024, 11:30 AM  Clinical Narrative:    Beatris with Pam from [place]. She coordinated with niece yesterday regarding IV antibiotic education. She will not be able to meet with Pam until tomorrow.  Updated team in securechat 1155 texted Pam daughters information as she had expressed interest in being taught.    Expected Discharge Plan:  (see note) Barriers to Discharge: Continued Medical Work up               Expected Discharge Plan and Services   Discharge Planning Services: CM Consult Post Acute Care Choice: Home Health Living arrangements for the past 2 months: Apartment                 DME Arranged: N/A DME Agency: NA       HH Arranged: RN HH AgencyHotel manager Home Health Care Date HH Agency Contacted: 03/29/24 Time HH Agency Contacted: 1448 Representative spoke with at [facility] Agency: Darleene   Social Drivers of Health (SDOH) Interventions SDOH Screenings   Food Insecurity: No Food Insecurity (03/25/2024)  Housing: Unknown (03/25/2024)  Transportation Needs: No Transportation Needs (03/25/2024)  Utilities: Not At Risk (03/25/2024)  Social Connections: Unknown (03/25/2024)  Tobacco Use: Medium Risk (03/24/2024)    Readmission Risk Interventions     No data to display

## 2024-03-30 NOTE — Progress Notes (Signed)
 [facility] PROGRESS NOTE  Patient: Gregory Camacho FMW:[id number]   PCP: Laurita Pillion, MD DOB: July 19, 1960   DOA: 03/24/2024   DOS: 03/30/2024    Subjective: Denies any acute complaint.  Pain resolved.  Objective:  Vitals:   03/30/24 0741 03/30/24 1314 03/30/24 1727 03/30/24 2009  BP: 118/63 100/65 111/69 (!) 104/57  Pulse: 76 71 74 87  Resp: 18  18   Temp: 98 F (36.7 C)  97.6 F (36.4 C) 98.3 F (36.8 C)  TempSrc: Oral  Oral Oral  SpO2: 100%  100% 97%  Weight:      Height:       Clear to auscultation S1-S2 present No edema. Ambulated her in the hallway.  Assessment and plan: L1 osteomyelitis. Medically stable. Awaiting education for safe dispo.  Author: Yetta Blanch, MD [facility] 03/30/2024 8:39 PM   If 7PM-7AM, please contact night-coverage at [email or web address]

## 2024-03-30 NOTE — Progress Notes (Signed)
 PICC placed per ECG technology and left at [facility] marking.  Peaked Pwave noted at hub.  ECG did not save to transfer to chart.  PCXR, obtained per policy, read as in the right atrium.  Dr Tobie orders okay to use PICC at current position.

## 2024-03-30 NOTE — Progress Notes (Signed)
 Mobility Specialist Progress Note:    03/30/24 1336  Mobility  Activity Ambulated with assistance (In hallway)  Level of Assistance Contact guard assist, steadying assist  Assistive Device None  Distance Ambulated (ft) 510 ft  Activity Response Tolerated well  Mobility Referral Yes  Mobility visit 1 Mobility  Mobility Specialist Start Time (ACUTE ONLY) 1313  Mobility Specialist Stop Time (ACUTE ONLY) 1325  Mobility Specialist Time Calculation (min) (ACUTE ONLY) 12 min   Received pt in bed and agreeable to mobility. Pt required MinG for safety. C/o back pain, otherwise tolerated well. Returned to room without fault. Left pt in bed with alarm on. Personal belongings and call light within reach. All needs met. LPN aware.  Lavanda Pollack Mobility Specialist  Please contact via [employer] or  Rehab Office [phone]

## 2024-03-30 NOTE — Plan of Care (Signed)
  Problem: Education: Goal: Ability to describe self-care measures that may prevent or decrease complications (Diabetes Survival Skills Education) will improve Outcome: Progressing Goal: Individualized Educational Video(s) Outcome: Progressing   Problem: Coping: Goal: Ability to adjust to condition or change in health will improve Outcome: Progressing   Problem: Fluid Volume: Goal: Ability to maintain a balanced intake and output will improve Outcome: Progressing   Problem: Health Behavior/Discharge Planning: Goal: Ability to identify and utilize available resources and services will improve Outcome: Progressing Goal: Ability to manage health-related needs will improve Outcome: Progressing   Problem: Metabolic: Goal: Ability to maintain appropriate glucose levels will improve Outcome: Progressing   Problem: Nutritional: Goal: Maintenance of adequate nutrition will improve Outcome: Progressing Goal: Progress toward achieving an optimal weight will improve Outcome: Progressing   Problem: Skin Integrity: Goal: Risk for impaired skin integrity will decrease Outcome: Progressing   Problem: Tissue Perfusion: Goal: Adequacy of tissue perfusion will improve Outcome: Progressing   Problem: Education: Goal: Knowledge of General Education information will improve Description: Including pain rating scale, medication(s)/side effects and non-pharmacologic comfort measures Outcome: Progressing   Problem: Health Behavior/Discharge Planning: Goal: Ability to manage health-related needs will improve Outcome: Progressing   Problem: Activity: Goal: Risk for activity intolerance will decrease Outcome: Progressing   Problem: Nutrition: Goal: Adequate nutrition will be maintained Outcome: Progressing   Problem: Coping: Goal: Level of anxiety will decrease Outcome: Progressing   Problem: Elimination: Goal: Will not experience complications related to bowel motility Outcome:  Progressing Goal: Will not experience complications related to urinary retention Outcome: Progressing   Problem: Pain Managment: Goal: General experience of comfort will improve and/or be controlled Outcome: Progressing   Problem: Safety: Goal: Ability to remain free from injury will improve Outcome: Progressing   Problem: Skin Integrity: Goal: Risk for impaired skin integrity will decrease Outcome: Progressing

## 2024-03-30 NOTE — Plan of Care (Signed)
   Problem: Coping: Goal: Level of anxiety will decrease Outcome: Progressing   Problem: Pain Managment: Goal: General experience of comfort will improve and/or be controlled Outcome: Progressing   Problem: Safety: Goal: Ability to remain free from injury will improve Outcome: Progressing

## 2024-03-31 LAB — GLUCOSE, CAPILLARY
Glucose-Capillary: 94 mg/dL (ref 70–99)
Glucose-Capillary: 84 mg/dL (ref 70–99)

## 2024-03-31 MED ORDER — ENSURE PLUS HIGH PROTEIN PO LIQD: 237.0000 mL | Freq: Two times a day (BID) | ORAL | 0 refills | Status: AC

## 2024-03-31 MED ORDER — METHOCARBAMOL 500 MG PO TABS: 500.0000 mg | ORAL_TABLET | Freq: Three times a day (TID) | ORAL | 0 refills | Status: AC | PRN

## 2024-03-31 MED ORDER — POLYETHYLENE GLYCOL 3350 17 G PO PACK: 17.0000 g | PACK | Freq: Every day | ORAL | 0 refills | Status: AC | PRN

## 2024-03-31 MED ORDER — VITAMIN B-1 100 MG PO TABS: 100.0000 mg | ORAL_TABLET | Freq: Every day | ORAL | 0 refills | Status: AC

## 2024-03-31 MED ORDER — ACETAMINOPHEN 500 MG PO TABS: 500.0000 mg | ORAL_TABLET | Freq: Three times a day (TID) | ORAL | Status: AC

## 2024-03-31 MED ORDER — CEFAZOLIN IV (FOR PTA / DISCHARGE USE ONLY): 6.0000 g | INTRAVENOUS | 0 refills | Status: AC

## 2024-03-31 MED ORDER — HYDROCODONE-ACETAMINOPHEN 5-325 MG PO TABS: 1.0000 | ORAL_TABLET | Freq: Four times a day (QID) | ORAL | 0 refills | Status: AC | PRN

## 2024-03-31 MED ORDER — FOLIC ACID 1 MG PO TABS: 1.0000 mg | ORAL_TABLET | Freq: Every day | ORAL | 0 refills | Status: AC

## 2024-03-31 NOTE — TOC Progression Note (Signed)
 Transition of Care (TOC) - Progression Note   Pam with [employer] plans on providing education with patient and sister at noon today.   Discharge is today after above.   Pam with Alfreda armin [place] with [facility] aware  Patient Details  Name: [name] MRN: [id number] Date of Birth: 1961-04-02  Transition of Care [facility]) CM/SW Contact  Orrin Yurkovich, Powell Jansky, RN Phone Number: 03/31/2024, 10:20 AM  Clinical Narrative:       Expected Discharge Plan:  (see note) Barriers to Discharge: Continued Medical Work up               Expected Discharge Plan and Services   Discharge Planning Services: CM Consult Post Acute Care Choice: Home Health Living arrangements for the past 2 months: Apartment                 DME Arranged: N/A DME Agency: NA       HH Arranged: RN HH Agency: [facility] Date [facility] Agency Contacted: 03/29/24 Time HH Agency Contacted: 1448 Representative spoke with at [facility] Agency: [place]   Social Drivers of Health (SDOH) Interventions SDOH Screenings   Food Insecurity: No Food Insecurity (03/25/2024)  Housing: Unknown (03/25/2024)  Transportation Needs: No Transportation Needs (03/25/2024)  Utilities: Not At Risk (03/25/2024)  Social Connections: Unknown (03/25/2024)  Tobacco Use: Medium Risk (03/24/2024)    Readmission Risk Interventions     No data to display

## 2024-03-31 NOTE — Plan of Care (Signed)
  Problem: Education: Goal: Ability to describe self-care measures that may prevent or decrease complications (Diabetes Survival Skills Education) will improve Outcome: Progressing Goal: Individualized Educational Video(s) Outcome: Progressing   Problem: Coping: Goal: Ability to adjust to condition or change in health will improve Outcome: Progressing   Problem: Fluid Volume: Goal: Ability to maintain a balanced intake and output will improve Outcome: Progressing   Problem: Metabolic: Goal: Ability to maintain appropriate glucose levels will improve Outcome: Progressing   Problem: Clinical Measurements: Goal: Ability to maintain clinical measurements within normal limits will improve Outcome: Progressing Goal: Will remain free from infection Outcome: Progressing Goal: Diagnostic test results will improve Outcome: Progressing Goal: Respiratory complications will improve Outcome: Progressing Goal: Cardiovascular complication will be avoided Outcome: Progressing   Problem: Activity: Goal: Risk for activity intolerance will decrease Outcome: Progressing   Problem: Nutrition: Goal: Adequate nutrition will be maintained Outcome: Progressing   Problem: Coping: Goal: Level of anxiety will decrease Outcome: Progressing   Problem: Elimination: Goal: Will not experience complications related to bowel motility Outcome: Progressing Goal: Will not experience complications related to urinary retention Outcome: Progressing   Problem: Pain Managment: Goal: General experience of comfort will improve and/or be controlled Outcome: Progressing   Problem: Safety: Goal: Ability to remain free from injury will improve Outcome: Progressing   Problem: Skin Integrity: Goal: Risk for impaired skin integrity will decrease Outcome: Progressing

## 2024-03-31 NOTE — Progress Notes (Addendum)
 Brief ID Note:    Gregory Camacho is a 63 y.o. adult admitted with MSSA related L1 vertebral infection and bacteremia.   OPAT ORDERS:  Diagnosis: L1 vertebral infection   Culture Result: MSSA  Allergies  Allergen Reactions   Bc Fast Pain Relief [Aspirin -Salicylamide-Caffeine] Other (See Comments)    Upset stomach     Discharge antibiotics to be given via PICC line:  Cefazolin  6 gm IV q24h via continuous infusion    Duration: 8 weeks from clearance 03/27/24  End Date: Nov 27th    [facility] Care Per Protocol with [employer] Use: Home health RN for IV administration and teaching, line care and labs.    Labs weekly while on IV antibiotics: _x_ CBC with differential __ BMP **TWICE WEEKLY ON VANCOMYCIN  _x_ CMP _x_ CRP _x_ ESR __ Vancomycin trough TWICE WEEKLY __ CK  _x_ Please pull PIC at completion of IV antibiotics __ Please leave PIC in place until doctor has seen patient or been notified  Fax weekly labs to [phone]  Clinic Follow Up Appt: 04/22/24 @ 10:45 am with Dr. Overton    ID will sign off - please call back with any questions/concerns or if we can be of further assistance.    Gregory Fireman, MSN, NP-C [facility] for Infectious Disease [facility]  [email or web address] Pager: [phone] Office: [phone] [facility] Main Line: [phone] *Secure Chat Communication Welcome  Total Encounter Time: No charge

## 2024-03-31 NOTE — Progress Notes (Addendum)
 PT Cancellation Note  Patient Details Name: Gregory Camacho MRN: [id number] DOB: 01-12-1961   Cancelled Treatment:    Reason Eval/Treat Not Completed: (P) Other (comment), pt on phone call with niece, declining mobility at this time. Will check back as schedule allows to continue with PT POC.   2:20p re-attempted with pt declining mobility stating he is about to D/C. Pt without questions or concerns re: mobility.   Therisa SAUNDERS. PTA Acute Rehabilitation Services Office: [phone]    Therisa CHRISTELLA Boor 03/31/2024, 1:40 PM

## 2024-03-31 NOTE — Progress Notes (Addendum)
 PHARMACY CONSULT NOTE FOR:  OUTPATIENT  PARENTERAL ANTIBIOTIC THERAPY (OPAT)  Indication:  MSSA bacteremia, L1 osteo/discitis abscess  Regimen: Cefazolin  6g daily as a continuous infusion End date: 05/22/24  IV antibiotic discharge orders are pended. To discharging provider:  please sign these orders via discharge navigator,  Select New Orders & click on the button choice - Manage This Unsigned Work.    Thank you for allowing pharmacy to be a part of this patient's care.  Almarie Lunger, PharmD, BCPS, BCIDP Infectious Diseases Clinical Pharmacist 03/31/2024 11:13 AM   **Pharmacist phone directory can now be found on [email or web address] ([email or web address]).  Listed under [facility].

## 2024-03-31 NOTE — Plan of Care (Signed)
  Problem: Education: Goal: Ability to describe self-care measures that may prevent or decrease complications (Diabetes Survival Skills Education) will improve Outcome: Progressing   Problem: Skin Integrity: Goal: Risk for impaired skin integrity will decrease Outcome: Progressing   Problem: Activity: Goal: Risk for activity intolerance will decrease Outcome: Progressing   Problem: Nutrition: Goal: Adequate nutrition will be maintained Outcome: Progressing   Problem: Pain Managment: Goal: General experience of comfort will improve and/or be controlled Outcome: Progressing   Problem: Safety: Goal: Ability to remain free from injury will improve Outcome: Progressing

## 2024-04-01 LAB — CULTURE, BLOOD (ROUTINE X 2)
Culture: NO GROWTH
Culture: NO GROWTH
Special Requests: ADEQUATE
Special Requests: ADEQUATE

## 2024-04-02 ENCOUNTER — Ambulatory Visit: Admitting: General Practice

## 2024-04-02 NOTE — Discharge Summary (Signed)
 Physician Discharge Summary   Patient: Gregory Camacho MRN: [id number] DOB: 20-Oct-1960  Admit date:     03/24/2024  Discharge date: 03/31/2024  Discharge Physician: Yetta Blanch  PCP: Laurita Pillion, MD  Recommendations at discharge: Follow up with PCP in 1 week, ID as recommended  Right lower lobe pulmonary nodule. 2 cm x 1.1 cm. Repeat CT scan in 3 months.   Follow-up Information     [facility]. Call.   Why: Call or walk in between 5:30am - 11:00Am Contact information: [phone] [address] [place], [place] [address]        Care, [facility] Follow up.   Specialty: Home Health Services Contact information: [address] [place] [address] [phone]         [facility] Follow up.   Why: [phone]        [facility] Dept Of [place]. [facility] Follow up on 04/22/2024.   Specialty: Infectious Diseases Why: 04/22/24 @ 10:45 am with Dr. Overton [place] information: [address] [place], [address] [place]  [address] [phone]        Vincente Shivers, NP Follow up.   Specialty: General Practice Why: TIME : 10:20 AM   PLEASE ARRIVE AT 10;00 am DATE: OCTOBER 08 , 2025  [facility]  PLEASE BRING ALL CURRENT MEDICATION, ID and INS CARD Contact information: [address] [place] [address] [phone]         Alcus Oneil ORN, FNP Follow up.   Specialty: Family Medicine Why: TIME : 9:30 AM   PLEASE ARRIVE AT 9:00 AM DATE : OCTOBER 21 , 2025  TUESDAY  PLEASE BRING ALL CURRENT MEDICATION, ID and INS CARD  KNOWLEDGE OF APPT MAILED TO PT'S, Contact information: [address] [place] [address] [phone]                Hospital Course: Gregory Camacho is a 63 y.o. male with hx of COPD, DM type 2, substance use disorder (cocaine, marijuana, tobacco), hx of persistent leukocytosis / thrombocytosis, suspected reactive, IDA, mood d/o, who  presented with worsening back pain. Unclear when began, but has worsened since around time of last ED visit on 9/11.  Patient has been admitted with concerns for L1 discitis/osteomyelitis and possible abscess.  There is concern for MSSA bacteremia with hematogenous spread given that he recently had urinary tract infection which was positive for MSSA.  MRI of the spine is concerning for L1 discitis/osteomyelitis.  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 Presented with complaints of back pain and fever. Initially presented in ED on 9/11 for back pain. He had a urine culture which was positive for Staph aureus.  Had a CT stone study which was negative for any renal abnormality. No history of IV drug abuse. Due to fever and leukocytosis, initiated on IV fluid.  Blood cultures were performed.  MRI showed possible L1 osteomyelitis. ED provider discussed with neurosurgery who does not feel that the patient has any operative intervention indication. Admitted to the hospital for further workup.  Transferred to [facility] from any pain for TEE and ID consultation. Echocardiogram shows normal valves without any vegetation. No significant regurgitation. TEE on 10/3 negative for any vegetation. ID recommending IV antibiotics at home. Patient does not have any history of IV drug  abuse and therefore would be a candidate for PICC line at home. TOC consulted.  PICC line placed.  Polysubstance abuse. Patient is a former smoker quit smoking recently and also abuses cocaine. Denies any IV drug use. Counseled to quit abusing substance and smoking.  Patient currently agreeable.  History of COPD. No evidence of exacerbation right now. Continue as needed treatments.  Underweight. Failure to thrive. Body mass index is 17.43 kg/m.  Placing the patient at high risk for poor  outcome.  Right lower lobe pulmonary nodule. 2 cm x 1.1 cm. Repeat CT scan in 3 months.  Pain control - [place]  Controlled Substance Reporting System database was reviewed. and patient was instructed, not to drive, operate heavy machinery, perform activities at heights, swimming or participation in water  activities or provide baby-sitting services while on Pain, Sleep and Anxiety Medications; until their outpatient Physician has advised to do so again. Also recommended to not to take more than prescribed Pain, Sleep and Anxiety Medications.  Consultants:  ID Neurosurgery   Procedures performed:  Echocardiogram TEE  DISCHARGE MEDICATION: Allergies as of 03/31/2024       Reactions   Bc Fast Pain Relief [aspirin -salicylamide-caffeine] Other (See Comments)   Upset stomach        Medication List     TAKE these medications    acetaminophen  500 MG tablet Commonly known as: TYLENOL  Take 1 tablet (500 mg total) by mouth every 8 (eight) hours.   ceFAZolin  IVPB Commonly known as: ANCEF  Inject 6 g into the vein daily. Indication:   MSSA bacteremia, L1 osteo/discitis abscess First Dose: Yes Last Day of Therapy:  05/22/24 Labs - Once weekly:  CBC/D and BMP, Labs - Once weekly: ESR and CRP Method of administration: 6g daily as a continuous infusion Method of administration may be changed at the discretion of home infusion pharmacist based upon assessment of the patient and/or caregiver's ability to self-administer the medication ordered.   feeding supplement Liqd Take 237 mLs by mouth 2 (two) times daily between meals.   folic acid 1 MG tablet Commonly known as: FOLVITE Take 1 tablet (1 mg total) by mouth daily.   HYDROcodone -acetaminophen  5-325 MG tablet Commonly known as: NORCO/VICODIN Take 1 tablet by mouth every 6 (six) hours as needed for severe pain (pain score 7-10).   ibuprofen  200 MG tablet Commonly known as: ADVIL  Take 200 mg by mouth every 6 (six) hours as  needed for moderate pain (pain score 4-6).   lidocaine  5 % Commonly known as: Lidoderm  Place 1 patch onto the skin daily. Remove & Discard patch within 12 hours or as directed by MD   methocarbamol  500 MG tablet Commonly known as: ROBAXIN  Take 1 tablet (500 mg total) by mouth every 8 (eight) hours as needed for muscle spasms.   polyethylene glycol 17 g packet Commonly known as: MIRALAX / GLYCOLAX Take 17 g by mouth daily as needed for mild constipation.   thiamine 100 MG tablet Commonly known as: Vitamin B-1 Take 1 tablet (100 mg total) by mouth daily.               Discharge Care Instructions  (From admission, onward)           Start     Ordered   03/31/24 0000  Change dressing on IV access line weekly and PRN  (Home infusion instructions - [facility] )        03/31/24 1158  Disposition: Home Diet recommendation: Regular diet  Discharge Exam: Vitals:   03/30/24 1727 03/30/24 2009 03/31/24 0509 03/31/24 1050  BP: 111/69 (!) 104/57 (!) 109/58 103/67  Pulse: 74 87 74 94  Resp: 18  18 17   Temp: 97.6 F (36.4 C) 98.3 F (36.8 C) 98 F (36.7 C)   TempSrc: Oral Oral Axillary   SpO2: 100% 97% 100% 100%  Weight:      Height:       General: in Mild distress, No Rash Cardiovascular: S1 and S2 Present, No Murmur Respiratory: Good respiratory effort, Bilateral Air entry present. No Crackles, No wheezes Abdomen: Bowel Sound present, No tenderness Extremities: No edema Neuro: Alert and oriented x3, no new focal deficit   Filed Weights   03/24/24 1543  Weight: 52 kg   Condition at discharge: stable  The results of significant diagnostics from this hospitalization (including imaging, microbiology, ancillary and laboratory) are listed below for reference.   Imaging Studies: DG CHEST PORT 1 VIEW Result Date: 03/30/2024 CLINICAL DATA:  [id number] PICC (peripherally inserted central catheter) in place [id number] EXAM: PORTABLE CHEST 1 VIEW  COMPARISON:  March 24, 2024 FINDINGS: The cardiomediastinal silhouette is unchanged in contour.RIGHT upper extremity PICC tip terminates over the superior RIGHT atrium. No pleural effusion. No pneumothorax. No acute pleuroparenchymal abnormality. IMPRESSION: RIGHT upper extremity PICC tip terminates over the superior RIGHT atrium. Electronically Signed   By: Corean Salter M.D.   On: 03/30/2024 10:15   US  EKG SITE RITE Result Date: 03/29/2024 If Site Rite image not attached, placement could not be confirmed due to current cardiac rhythm.  ECHO TEE Result Date: 03/28/2024    TRANSESOPHOGEAL ECHO REPORT   Patient Name:   MATHHEW BUYSSE [facility] Date of Exam: 03/28/2024 Medical Rec #:  [id number]       Height:       68.0 in Accession #:    [id number]      Weight:       114.6 lb Date of Birth:  [birth year]       BSA:          1.613 m Patient Age:    63 years        BP:           112/81 mmHg Patient Gender: M               HR:           80 bpm. Exam Location:  Inpatient Procedure: Transesophageal Echo, Cardiac Doppler and Color Doppler (Both            Spectral and Color Flow Doppler were utilized during procedure). Indications:     Endocarditis  History:         Patient has prior history of Echocardiogram examinations, most                  recent 03/25/2024. COPD; Risk Factors:Diabetes.  Sonographer:     Thea Norlander RCS Referring Phys:  [id number] MADONNA LARGE Diagnosing Phys: Emeline Calender PROCEDURE: After discussion of the risks and benefits of a TEE, an informed consent was obtained from the patient. The transesophogeal probe was passed without difficulty through the esophogus of the patient. Imaged were obtained with the patient in a left lateral decubitus position. Sedation performed by different physician. The patient was monitored while under deep sedation. Anesthestetic sedation was provided intravenously by Anesthesiology: 194.7mg  of Propofol , 80mg  of Lidocaine . The patient developed no complications during  the procedure.  IMPRESSIONS  1. Left ventricular ejection fraction, by estimation, is 55 to 60%. The left ventricle has normal function.  2. Right ventricular systolic function is normal. The right ventricular size is normal.  3. No left atrial/left atrial appendage thrombus was detected.  4. Right atrial size was mildly dilated.  5. The mitral valve is normal in structure. Trivial mitral valve regurgitation.  6. The aortic valve is normal in structure. Aortic valve regurgitation is not visualized. Conclusion(s)/Recommendation(s): No evidence of vegetation/infective endocarditis on this transesophageael echocardiogram. FINDINGS  Left Ventricle: Left ventricular ejection fraction, by estimation, is 55 to 60%. The left ventricle has normal function. The left ventricular internal cavity size was normal in size. There is no left ventricular hypertrophy. Right Ventricle: The right ventricular size is normal. No increase in right ventricular wall thickness. Right ventricular systolic function is normal. Left Atrium: Left atrial size was normal in size. No left atrial/left atrial appendage thrombus was detected. Right Atrium: Right atrial size was mildly dilated. Prominent Chiari network. Pericardium: There is no evidence of pericardial effusion. Mitral Valve: The mitral valve is normal in structure. Trivial mitral valve regurgitation. Tricuspid Valve: The tricuspid valve is normal in structure. Tricuspid valve regurgitation is trivial. Aortic Valve: The aortic valve is normal in structure. Aortic valve regurgitation is not visualized. Pulmonic Valve: The pulmonic valve was normal in structure. Pulmonic valve regurgitation is trivial. Aorta: The aortic root and ascending aorta are structurally normal, with no evidence of dilitation. IAS/Shunts: No atrial level shunt detected by color flow Doppler. Additional Comments: Spectral Doppler performed. LEFT VENTRICLE PLAX 2D LVOT diam:     1.90 cm LVOT Area:     2.84 cm   AORTA  Ao Root diam: 3.10 cm Ao Asc diam:  3.40 cm  SHUNTS Systemic Diam: 1.90 cm Emeline Calender Electronically signed by Emeline Calender Signature Date/Time: 03/28/2024/2:36:25 PM    Final    EP STUDY Result Date: 03/28/2024 See surgical note for result.  MR CERVICAL SPINE W WO CONTRAST Result Date: 03/27/2024 EXAM: MRI CERVICAL SPINE WITH AND WITHOUT CONTRAST 03/27/2024 12:46:54 AM TECHNIQUE: Multiplanar multisequence MRI of the cervical spine was performed without and with the administration of 6 mL gadobutrol (GADAVIST) 1 MMOL/ML intravenous contrast. COMPARISON: None available. CLINICAL HISTORY: Rule out discitis / epidural infection. FINDINGS: BONES AND ALIGNMENT: Normal alignment. Normal vertebral body heights. Marrow signal is unremarkable. No abnormal enhancement. SPINAL CORD: Normal spinal cord size. Normal spinal cord signal. SOFT TISSUES: No paraspinal mass. C2-C3: No significant disc herniation. No spinal canal stenosis or neural foraminal narrowing. C3-C4: Small disc bulge with bilateral uncovertebral hypertrophy. Mild spinal canal stenosis. Moderate right and severe left foraminal stenosis. C4-C5: Minimal disc bulge. Mild left foraminal stenosis. C5-C6: Small disc bulge with bilateral uncovertebral hypertrophy. Moderate bilateral foraminal stenosis. C6-C7: Small disc osteophyte complex. Mild left foraminal stenosis. C7-T1: No significant disc herniation. No spinal canal stenosis or neural foraminal narrowing. IMPRESSION: 1. C3-4: Severe left and moderate right neural foraminal stenosis; mild spinal canal stenosis. 2. C5-6: Moderate bilateral neural foraminal stenosis. Electronically signed by: Franky Stanford MD 03/27/2024 01:09 AM EDT RP Workstation: [id number]   ECHOCARDIOGRAM COMPLETE Result Date: 03/25/2024    ECHOCARDIOGRAM REPORT   Patient Name:   ARLENE GENOVA Date of Exam: 03/25/2024 Medical Rec #:  [id number]       Height:       68.0 in Accession #:    [id number]      Weight:       114.6 lb Date of  Birth:  [birth year]       BSA:          1.613 m Patient Age:    63 years        BP:           124/70 mmHg Patient Gender: M               HR:           88 bpm. Exam Location:  [facility] Procedure: 2D Echo, Cardiac Doppler and Color Doppler (Both Spectral and Color            Flow Doppler were utilized during procedure). Indications:    Endocarditis  History:        Patient has no prior history of Echocardiogram examinations.                 COPD; Risk Factors:Diabetes.  Sonographer:    Carmelita Hartshorn RDCS, FE, PE Referring Phys: [id number] JONATHAN SEGARS IMPRESSIONS  1. Left ventricular ejection fraction, by estimation, is 60 to 65%. The left ventricle has normal function. The left ventricle has no regional wall motion abnormalities. Left ventricular diastolic parameters are consistent with Grade I diastolic dysfunction (impaired relaxation).  2. Right ventricular systolic function is normal. The right ventricular size is normal. There is normal pulmonary artery systolic pressure.  3. The mitral valve is normal in structure. Trivial mitral valve regurgitation. No evidence of mitral stenosis.  4. The aortic valve is tricuspid. Aortic valve regurgitation is not visualized. No aortic stenosis is present.  5. The inferior vena cava is normal in size with greater than 50% respiratory variability, suggesting right atrial pressure of 3 mmHg. Comparison(s): No prior Echocardiogram. Conclusion(s)/Recommendation(s): No evidence of valvular vegetations on this transthoracic echocardiogram. Consider a transesophageal echocardiogram to exclude infective endocarditis if clinically indicated. FINDINGS  Left Ventricle: Left ventricular ejection fraction, by estimation, is 60 to 65%. The left ventricle has normal function. The left ventricle has no regional wall motion abnormalities. Strain was performed and the global longitudinal strain is indeterminate. The left ventricular internal cavity size was normal in size. There is no left  ventricular hypertrophy. Left ventricular diastolic parameters are consistent with Grade I diastolic dysfunction (impaired relaxation). Normal left ventricular filling pressure. Right Ventricle: The right ventricular size is normal. No increase in right ventricular wall thickness. Right ventricular systolic function is normal. There is normal pulmonary artery systolic pressure. The tricuspid regurgitant velocity is 2.57 m/s, and  with an assumed right atrial pressure of 3 mmHg, the estimated right ventricular systolic pressure is 29.4 mmHg. Left Atrium: Left atrial size was normal in size. Right Atrium: Right atrial size was normal in size. Pericardium: There is no evidence of pericardial effusion. Mitral Valve: The mitral valve is normal in structure. Trivial mitral valve regurgitation. No evidence of mitral valve stenosis. Tricuspid Valve: The tricuspid valve is normal in structure. Tricuspid valve regurgitation is trivial. No evidence of tricuspid stenosis. Aortic Valve: The aortic valve is tricuspid. Aortic valve regurgitation is not visualized. No aortic stenosis is present. Pulmonic Valve: The pulmonic valve was not well visualized. Pulmonic valve regurgitation is not visualized. No evidence of pulmonic stenosis. Aorta: The aortic root is normal in size and structure. Venous: The inferior vena cava is normal in size with greater than 50% respiratory variability, suggesting right atrial pressure of 3 mmHg. IAS/Shunts: No atrial level shunt detected by color flow Doppler. Additional Comments: 3D was performed not requiring image post processing on an independent  workstation and was indeterminate.  LEFT VENTRICLE PLAX 2D LVIDd:         4.30 cm   Diastology LVIDs:         2.80 cm   LV e' medial:    6.85 cm/s LV PW:         1.00 cm   LV E/e' medial:  8.4 LV IVS:        1.00 cm   LV e' lateral:   10.70 cm/s LVOT diam:     2.10 cm   LV E/e' lateral: 5.4 LV SV:         60 LV SV Index:   37 LVOT Area:     3.46 cm   RIGHT VENTRICLE RV Basal diam:  2.40 cm RV Mid diam:    2.10 cm RV S prime:     9.25 cm/s TAPSE (M-mode): 1.7 cm LEFT ATRIUM             Index        RIGHT ATRIUM           Index LA diam:        3.00 cm 1.86 cm/m   RA Area:     12.80 cm LA Vol (A2C):   28.2 ml 17.48 ml/m  RA Volume:   29.50 ml  18.29 ml/m LA Vol (A4C):   36.1 ml 22.38 ml/m LA Biplane Vol: 32.8 ml 20.33 ml/m  AORTIC VALVE LVOT Vmax:   103.00 cm/s LVOT Vmean:  67.000 cm/s LVOT VTI:    0.174 m  AORTA Ao Root diam: 3.10 cm MITRAL VALVE               TRICUSPID VALVE MV Area (PHT): 3.99 cm    TR Peak grad:   26.4 mmHg MV Decel Time: 190 msec    TR Vmax:        257.00 cm/s MV E velocity: 57.50 cm/s MV A velocity: 87.80 cm/s  SHUNTS MV E/A ratio:  0.65        Systemic VTI:  0.17 m                            Systemic Diam: 2.10 cm Vishnu Priya Mallipeddi Electronically signed by Diannah Late Mallipeddi Signature Date/Time: 03/25/2024/12:15:11 PM    Final    CT CHEST WO CONTRAST Result Date: 03/24/2024 EXAM: CT CHEST WITHOUT CONTRAST 03/24/2024 11:03:11 PM TECHNIQUE: CT of the chest was performed without the administration of intravenous contrast. Multiplanar reformatted images are provided for review. Automated exposure control, iterative reconstruction, and/or weight based adjustment of the mA/kV was utilized to reduce the radiation dose to as low as reasonably achievable. COMPARISON: Comparison with chest radiograph 03/24/2024 and CT chest 09/23/2021 acceptable. CLINICAL HISTORY: Eval for pulm septic emboli, suspect bacteremia + metastatic infection. recent RLL pulm nodule. Evaluate for metastatic infection, recent RLL pulmonary nodule. FINDINGS: MEDIASTINUM: Heart and pericardium are unremarkable. The central airways are clear. LYMPH NODES: Perifissural lymph nodes are unchanged compared to 09/23/2021. No mediastinal or axillary lymphadenopathy. LUNGS AND PLEURA: Upper lobe predominant emphysema. Small left and trace right pleural effusions.  Scarring in the lower lobes. The semisolid nodule in the lateral right lower lobe is more confluent today compared to 03/06/2024 and measures 2.0 x 1.1 cm (series 3 image 114). No pulmonary edema. No pneumothorax. SOFT TISSUES/BONES: No acute abnormality of the bones or soft tissues. UPPER ABDOMEN: Limited images of the upper abdomen demonstrates no  acute abnormality. IMPRESSION: 1. Confluent subpleural nodule in the lateral right lower lobe in the area of the previously seen subsolid nodule, recommend follow-up CT in 3 months. 2. Small left and trace right pleural effusions. Electronically signed by: Norman Gatlin MD 03/24/2024 11:22 PM EDT RP Workstation: [id number]   MR Lumbar Spine W Wo Contrast Result Date: 03/24/2024 CLINICAL DATA:  Initial evaluation for acute back pain, fever. EXAM: MRI LUMBAR SPINE WITHOUT AND WITH CONTRAST TECHNIQUE: Multiplanar and multiecho pulse sequences of the lumbar spine were obtained without and with intravenous contrast. CONTRAST:  5mL GADAVIST GADOBUTROL 1 MMOL/ML IV SOLN COMPARISON:  Radiograph from 03/06/2024 FINDINGS: Segmentation: Standard. Lowest well-formed disc space labeled the L5-S1 level. Alignment: Moderate dextroscoliosis, apex at L2-3. No significant listhesis. Vertebrae: Vertebral body height maintained without acute or chronic fracture. Bone marrow signal intensity diffusely heterogeneous. No worrisome osseous lesions. There is abnormal marrow edema involving the L1 vertebral body. Hazy inflammatory changes within the adjacent paraspinous soft tissues. Findings raise the concern for acute osteomyelitis at this level. Mild enhancement about the inferior endplate of T12, but with no other overt evidence for intervening discitis at this time. No epidural involvement. 1.6 cm area along the left anterolateral margin of the T12-L1 disc space could reflect a small abscess versus extruded disc material (series 16, image 4). No other discrete abscess or drainable fluid  collection. No other evidence for acute infection elsewhere within the lumbar spine. Conus medullaris and cauda equina: Conus extends to the L1 level. Conus and cauda equina appear normal. Paraspinal and other soft tissues: Paraspinous inflammation adjacent to T12-L1 as above. Paraspinous soft tissues demonstrate no other acute finding. Disc levels: L1-2: Small chronic endplate Schmorl's node deformity. Otherwise negative interspace. Mild facet hypertrophy. No stenosis. L2-3: Disc desiccation with minimal disc bulge. Mild facet hypertrophy. No spinal stenosis. Foramina remain patent. L3-4: Negative interspace. Mild facet hypertrophy. No significant spinal stenosis. Foramina remain patent. L4-5: Degenerate intervertebral disc space narrowing with disc desiccation diffuse disc bulge. Reactive endplate spurring. Mild facet and ligament flavum hypertrophy. No significant spinal stenosis. Moderate bilateral L4 foraminal narrowing. L5-S1: Degenerative intervertebral disc space narrowing with disc desiccation diffuse disc bulge. Reactive endplate spurring. Moderate right worse than left facet hypertrophy. No spinal stenosis. Moderate bilateral L5 foraminal narrowing. IMPRESSION: 1. Abnormal marrow edema involving the L1 vertebral body with adjacent paraspinous could reflect extruded disc material versus a small abscess. No epidural involvement. 2. No other evidence for acute infection elsewhere within the lumbar spine. 3. Degenerative spondylosis at L4-5 and L5-S1 with resultant moderate bilateral L4 and L5 foraminal stenosis. Electronically Signed   By: Morene Hoard M.D.   On: 03/24/2024 19:29   DG Chest 2 View Result Date: 03/24/2024 CLINICAL DATA:  Back pain history of lung mass EXAM: CHEST - 2 VIEW COMPARISON:  Chest x-ray 09/08/2021, CT 03/06/2024 FINDINGS: The heart size and mediastinal contours are within normal limits. Both lungs are clear. The visualized skeletal structures are unremarkable. Sub solid  right lung base density on CT is not well seen radiographically IMPRESSION: No active cardiopulmonary disease. Electronically Signed   By: Luke Bun M.D.   On: 03/24/2024 16:19   CT Renal Stone Study Result Date: 03/06/2024 CLINICAL DATA:  Flank pain. EXAM: CT ABDOMEN AND PELVIS WITHOUT CONTRAST TECHNIQUE: Multidetector CT imaging of the abdomen and pelvis was performed following the standard protocol without IV contrast. RADIATION DOSE REDUCTION: This exam was performed according to the departmental dose-optimization program which includes automated exposure control, adjustment of  the mA and/or kV according to patient size and/or use of iterative reconstruction technique. COMPARISON:  May 27, 2018. FINDINGS: Lower chest: 9 mm sub solid density seen in right lower lobe laterally. Hepatobiliary: No focal liver abnormality is seen. No gallstones, gallbladder wall thickening, or biliary dilatation. Pancreas: Unremarkable. No pancreatic ductal dilatation or surrounding inflammatory changes. Spleen: Normal in size without focal abnormality. Adrenals/Urinary Tract: Adrenal glands are unremarkable. Kidneys are normal, without renal calculi, focal lesion, or hydronephrosis. Bladder is unremarkable. Stomach/Bowel: Stomach is unremarkable. There is no evidence of bowel obstruction or inflammation. The appendix is not clearly identified. Vascular/Lymphatic: No significant vascular findings are present. No enlarged abdominal or pelvic lymph nodes. Reproductive: Prostatic calcifications are noted. Other: No abdominal wall hernia or abnormality. No abdominopelvic ascites. Musculoskeletal: No acute or significant osseous findings. IMPRESSION: 1. 9 mm sub solid density seen in right lower lobe laterally. Initial follow-up by chest CT without contrast is recommended in 3 months to confirm persistence. This recommendation follows the consensus statement: Recommendations for the Management of Subsolid Pulmonary Nodules  Detected at CT: A Statement from the Fleischner Society as published in Radiology 2013; 266:304-317. 2. No acute abnormality seen in the abdomen or pelvis. Electronically Signed   By: Lynwood Landy Raddle M.D.   On: 03/06/2024 11:39   DG Lumbar Spine Complete Result Date: 03/06/2024 CLINICAL DATA:  63 year old male with back pain onset 4 days ago. EXAM: LUMBAR SPINE - COMPLETE 4+ VIEW COMPARISON:  CT Abdomen and Pelvis 05/27/2018. FINDINGS: Transitional lumbosacral anatomy. 4 lumbarized levels. Mild dextroconvex lumbar scoliosis is stable since 2019. Mild straightening of lordosis since that time. Maintained vertebral height. Stable disc spaces, moderate chronic disc space loss at the lower two lumbarized intervertebral levels. No spondylolisthesis. No pars fracture. Grossly intact visible sacrum. No acute osseous abnormality identified. Nonobstructed bowel-gas pattern with retained stool. Grossly negative lung bases. IMPRESSION: 1. Transitional lumbosacral anatomy. 2. No acute osseous abnormality identified in the Lumbar spine. 3. 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 Electronically Signed   By: VEAR Hurst M.D.   On: 03/06/2024 08:53    Microbiology: Results for orders placed or performed during the hospital encounter of 03/24/24  Blood culture (routine x 2)     Status: Abnormal   Collection Time: 03/24/24  6:06 PM   Specimen: Left Antecubital; Blood  Result Value Ref Range Status   Specimen Description   Final    LEFT ANTECUBITAL Performed at [facility], [address], [place], [place] [address]    Special Requests   Final    AEROBIC BOTTLE ONLY Blood Culture adequate volume Performed at [facility], [address], [name], [place] [address]    Culture  Setup Time   Final    AEROBIC BOTTLE ONLY GRAM POSITIVE COCCI Gram Stain Report Called to,Read Back By and Verified With: S HLADILEK AT 1212 ON 09.30.25 BY ADGER J  Performed at [facility], [address] [place], [place]  [address]    Culture (A)  Final    STAPHYLOCOCCUS AUREUS SUSCEPTIBILITIES PERFORMED ON PREVIOUS CULTURE WITHIN THE LAST 5 DAYS. Performed at [facility], [address] [place], [place] [address]    Report Status 03/27/2024 FINAL  Final  Blood culture (routine x 2)     Status: Abnormal   Collection Time: 03/24/24  6:08 PM   Specimen: Left Antecubital; Blood  Result Value Ref Range Status   Specimen Description   Final    LEFT ANTECUBITAL Performed at [facility], [address], [place], [place] [address]    Special Requests   Final    BOTTLES DRAWN AEROBIC AND ANAEROBIC Blood Culture adequate volume Performed at [facility], [address] [place], [place] [address]    Culture  Setup Time   Final    IN BOTH AEROBIC AND ANAEROBIC BOTTLES GRAM POSITIVE COCCI Gram Stain Report Called to,Read Back By and Verified With: S HLADILEK AT 1212 ON 09.30.25 BY ADGER J  CRITICAL RESULT CALLED TO, READ BACK BY AND VERIFIED WITH: TILDA JUDITHANN KOBS [phone] @ [phone] Performed at [facility], [address], [place], [place] [address]    Culture STAPHYLOCOCCUS AUREUS (A)  Final   Report Status 03/27/2024 FINAL  Final   Organism ID, Bacteria STAPHYLOCOCCUS AUREUS  Final      Susceptibility   Staphylococcus aureus - MIC*    CIPROFLOXACIN  >=8 RESISTANT Resistant     ERYTHROMYCIN >=8 RESISTANT Resistant     GENTAMICIN <=0.5 SENSITIVE Sensitive     OXACILLIN 0.5 SENSITIVE Sensitive     TETRACYCLINE <=1 SENSITIVE Sensitive     VANCOMYCIN 1 SENSITIVE Sensitive     TRIMETH /SULFA  <=10 SENSITIVE Sensitive     CLINDAMYCIN <=0.25 SENSITIVE Sensitive     RIFAMPIN <=0.5 SENSITIVE Sensitive     Inducible Clindamycin NEGATIVE Sensitive     LINEZOLID 2 SENSITIVE Sensitive     * STAPHYLOCOCCUS AUREUS  Blood Culture ID Panel (Reflexed)     Status: Abnormal   Collection Time: 03/24/24  6:08 PM  Result Value Ref Range Status   Enterococcus faecalis NOT DETECTED NOT DETECTED Final   Enterococcus Faecium  NOT DETECTED NOT DETECTED Final   Listeria monocytogenes NOT DETECTED NOT DETECTED Final   Staphylococcus species DETECTED (A) NOT DETECTED Final    Comment: CRITICAL RESULT CALLED TO, READ BACK BY AND VERIFIED WITH: TILDA JUDITHANN KOBS [phone] @ 1712 FH    Staphylococcus aureus (BCID) DETECTED (A) NOT DETECTED Final    Comment: CRITICAL RESULT CALLED TO, READ BACK BY AND VERIFIED WITH: TILDA JUDITHANN KOBS [phone] @ 1712 FH    Staphylococcus epidermidis NOT DETECTED NOT DETECTED Final   Staphylococcus lugdunensis NOT DETECTED NOT DETECTED Final   Streptococcus species NOT DETECTED NOT DETECTED Final   Streptococcus agalactiae NOT DETECTED NOT DETECTED Final   Streptococcus pneumoniae NOT DETECTED NOT DETECTED Final   Streptococcus pyogenes NOT DETECTED NOT DETECTED Final   A.calcoaceticus-baumannii NOT DETECTED NOT DETECTED Final   Bacteroides fragilis NOT DETECTED NOT DETECTED Final   Enterobacterales NOT DETECTED NOT DETECTED Final   Enterobacter cloacae complex NOT DETECTED NOT DETECTED Final   Escherichia coli NOT DETECTED NOT DETECTED Final   Klebsiella aerogenes NOT DETECTED NOT DETECTED Final   Klebsiella oxytoca NOT DETECTED NOT DETECTED Final   Klebsiella pneumoniae NOT DETECTED NOT DETECTED Final   Proteus species NOT DETECTED NOT DETECTED Final   Salmonella species NOT DETECTED NOT DETECTED Final   Serratia marcescens NOT DETECTED NOT DETECTED Final   Haemophilus influenzae NOT DETECTED NOT DETECTED Final   Neisseria meningitidis NOT DETECTED NOT DETECTED Final   Pseudomonas aeruginosa NOT DETECTED NOT DETECTED Final   Stenotrophomonas maltophilia NOT DETECTED NOT DETECTED Final   Candida albicans NOT DETECTED NOT DETECTED Final   Candida auris NOT DETECTED NOT DETECTED Final   Candida glabrata NOT DETECTED NOT DETECTED Final   Candida krusei NOT DETECTED NOT DETECTED Final   Candida parapsilosis NOT DETECTED NOT DETECTED Final   Candida tropicalis NOT DETECTED NOT DETECTED Final  Cryptococcus neoformans/gattii NOT DETECTED NOT DETECTED Final   Meth resistant mecA/C and MREJ NOT DETECTED NOT DETECTED Final    Comment: Performed at [facility], [address], [place], [place] [address]  Resp panel by RT-PCR (RSV, Flu A&B, Covid) Anterior Nasal Swab     Status: None   Collection Time: 03/24/24  6:28 PM   Specimen: Anterior Nasal Swab  Result Value Ref Range Status   SARS Coronavirus 2 by RT PCR NEGATIVE NEGATIVE Final    Comment: (NOTE) SARS-CoV-2 target nucleic acids are NOT DETECTED.  The SARS-CoV-2 RNA is generally detectable in upper respiratory specimens during the acute phase of infection. The lowest concentration of SARS-CoV-2 viral copies this assay can detect is 138 copies/mL. A negative result does not preclude SARS-Cov-2 infection and should not be used as the sole basis for treatment or other patient management decisions. A negative result may occur with  improper specimen collection/handling, submission of specimen other than nasopharyngeal swab, presence of viral mutation(s) within the areas targeted by this assay, and inadequate number of viral copies(<138 copies/mL). A negative result must be combined with clinical observations, patient history, and epidemiological information. The expected result is Negative.  Fact Sheet for Patients:  BloggerCourse.com  Fact Sheet for Healthcare Providers:  SeriousBroker.it  This test is no t yet approved or cleared by the [place]  FDA and  has been authorized for detection and/or diagnosis of SARS-CoV-2 by FDA under an Emergency Use Authorization (EUA). This EUA will remain  in effect (meaning this test can be used) for the duration of the COVID-19 declaration under Section 564(b)(1) of the Act, 21 U.S.C.section 360bbb-3(b)(1), unless the authorization is terminated  or revoked sooner.       Influenza A by PCR NEGATIVE NEGATIVE Final    Influenza B by PCR NEGATIVE NEGATIVE Final    Comment: (NOTE) The Xpert Xpress SARS-CoV-2/FLU/RSV plus assay is intended as an aid in the diagnosis of influenza from Nasopharyngeal swab specimens and should not be used as a sole basis for treatment. Nasal washings and aspirates are unacceptable for Xpert Xpress SARS-CoV-2/FLU/RSV testing.  Fact Sheet for Patients: BloggerCourse.com  Fact Sheet for Healthcare Providers: SeriousBroker.it  This test is not yet approved or cleared by the [place]  FDA and has been authorized for detection and/or diagnosis of SARS-CoV-2 by FDA under an Emergency Use Authorization (EUA). This EUA will remain in effect (meaning this test can be used) for the duration of the COVID-19 declaration under Section 564(b)(1) of the Act, 21 U.S.C. section 360bbb-3(b)(1), unless the authorization is terminated or revoked.     Resp Syncytial Virus by PCR NEGATIVE NEGATIVE Final    Comment: (NOTE) Fact Sheet for Patients: BloggerCourse.com  Fact Sheet for Healthcare Providers: SeriousBroker.it  This test is not yet approved or cleared by the [place]  FDA and has been authorized for detection and/or diagnosis of SARS-CoV-2 by FDA under an Emergency Use Authorization (EUA). This EUA will remain in effect (meaning this test can be used) for the duration of the COVID-19 declaration under Section 564(b)(1) of the Act, 21 U.S.C. section 360bbb-3(b)(1), unless the authorization is terminated or revoked.  Performed at [facility], [address] [place], [place] [address]   Urine Culture     Status: None   Collection Time: 03/25/24  6:40 AM   Specimen: Urine, Random  Result Value Ref Range Status   Specimen Description   Final    URINE, RANDOM Performed at [facility], [address], [place], [place] [address]    Special Requests   Final    NONE  Reflexed from [phone] Performed at [facility], [address] [place], [place] [address]    Culture   Final    NO GROWTH Performed at [facility], [address] [place], [place] [address]    Report Status 03/26/2024 FINAL  Final  Culture, blood (Routine X 2) w Reflex to ID Panel     Status: None   Collection Time: 03/27/24  8:06 AM   Specimen: BLOOD LEFT ARM  Result Value Ref Range Status   Specimen Description BLOOD LEFT ARM  Final   Special Requests   Final    BOTTLES DRAWN AEROBIC AND ANAEROBIC Blood Culture adequate volume   Culture   Final    NO GROWTH 5 DAYS Performed at [facility], [address] [place], [place] [address]    Report Status 04/01/2024 FINAL  Final  Culture, blood (Routine X 2) w Reflex to ID Panel     Status: None   Collection Time: 03/27/24  8:08 AM   Specimen: BLOOD RIGHT ARM  Result Value Ref Range Status   Specimen Description BLOOD RIGHT ARM  Final   Special Requests   Final    BOTTLES DRAWN AEROBIC AND ANAEROBIC Blood Culture adequate volume   Culture   Final    NO GROWTH 5 DAYS Performed at [facility], [address] [place], [place] [address]    Report Status 04/01/2024 FINAL  Final   Labs: CBC: Recent Labs  Lab 03/28/24 0818 03/29/24 0951 03/30/24 0356  WBC 10.0 7.9 8.2  NEUTROABS  --  4.5 4.5  HGB 9.7* 10.0* 9.0*  HCT 30.6* 31.7* 28.3*  MCV 81.8 82.3 81.3  PLT 572* 591* 591*   Basic Metabolic Panel: Recent Labs  Lab 03/28/24 0818 03/29/24 0951 03/30/24 0356  NA 138 138 136  K 4.6 4.2 4.1  CL 100 101 99  CO2 30 27 28   GLUCOSE 105* 91 111*  BUN 11 14 16   CREATININE 0.96 0.88 0.89  CALCIUM 8.4* 8.2* 8.3*  MG 2.0 2.0 2.1   Liver Function Tests: Recent Labs  Lab 03/29/24 0951 03/30/24 0356  AST 23 26  ALT 14 14  ALKPHOS 57 57  BILITOT <0.2 0.2  PROT 7.2 7.3  ALBUMIN 2.1* 2.0*   CBG: Recent Labs  Lab 03/30/24 1155 03/30/24 1701 03/30/24 2108 03/31/24 0843 03/31/24 1200  GLUCAP 117*  135* 100* 94 84    Discharge time spent: greater than 30 minutes.  Author: Yetta Blanch, MD  [facility] 03/31/2024

## 2024-04-15 ENCOUNTER — Ambulatory Visit: Payer: Self-pay | Admitting: Family Medicine

## 2024-04-22 ENCOUNTER — Ambulatory Visit: Admitting: Internal Medicine

## 2024-05-05 ENCOUNTER — Telehealth: Payer: Self-pay

## 2024-05-05 ENCOUNTER — Ambulatory Visit (INDEPENDENT_AMBULATORY_CARE_PROVIDER_SITE_OTHER): Admitting: Internal Medicine

## 2024-05-05 ENCOUNTER — Other Ambulatory Visit: Payer: Self-pay

## 2024-05-05 DIAGNOSIS — B9561 Methicillin susceptible Staphylococcus aureus infection as the cause of diseases classified elsewhere: Secondary | ICD-10-CM

## 2024-05-05 DIAGNOSIS — R7881 Bacteremia: Secondary | ICD-10-CM | POA: Diagnosis not present

## 2024-05-05 NOTE — Patient Instructions (Signed)
 PICC line come out after last dose of abx on 11/27 F/U with ID in a month

## 2024-05-05 NOTE — Telephone Encounter (Signed)
 Per Dr. Dennise picc can be pulled after last dose 11/27. Community message sent to [employer]. Pharmacy team CC in message. Lorenda CHRISTELLA Code, RMA

## 2024-05-05 NOTE — Progress Notes (Signed)
 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 male with substance abuse, COPD, depression, sinus congestion presents for hospital follow-up MSSA related L1 vertebral infection with bacteremia.  Discharged on cefazolin  x 8 weeks from clearance EOT 05/22/2024.  Prior to hospitalization noted pain started a month ago some fevers.  Has a history of drug abuse and smoked crack to help with the pain.  Blood cultures 9/29 2 out of 2 MSSA 10/2 no growth. MR lumbar spine showed abnormal marrow edema involving L1 vertebral body with adjacent paraspinous could reflect extruded disc material versus small abscess.  MRI cervical spine did not show infection.  CT chest noted subpleural nodule, recommend follow-up imaging Today: No missed doses of abx. Bak pain improved, some soreness onleft side but improved.    Review of Systems  All other systems reviewed and are negative.   Past Medical History:  Diagnosis Date   COPD (chronic obstructive pulmonary disease) (HCC)    Depression    Sinus congestion     Outpatient Medications Prior to Visit  Medication Sig Dispense Refill   acetaminophen  (TYLENOL ) 500 MG tablet Take 1 tablet (500 mg total) by mouth every 8 (eight)  hours.     ceFAZolin  (ANCEF ) IVPB Inject 6 g into the vein daily. Indication:   MSSA bacteremia, L1 osteo/discitis abscess First Dose: Yes Last Day of Therapy:  05/22/24 Labs - Once weekly:  CBC/D and BMP, Labs - Once weekly: ESR and CRP Method of administration: 6g daily as a continuous infusion Method of administration may be changed at the discretion of home infusion pharmacist based upon assessment of the patient and/or caregiver's ability to self-administer the medication ordered. 52 Units 0   feeding supplement (ENSURE PLUS HIGH PROTEIN) LIQD Take 237 mLs by mouth 2 (two) times daily between meals. 10000 mL 0   folic acid (FOLVITE) 1 MG tablet Take 1 tablet (1 mg total) by mouth daily. 30 tablet 0   HYDROcodone -acetaminophen  (NORCO/VICODIN) 5-325 MG tablet Take 1 tablet by mouth every 6 (six) hours as needed for severe pain (pain score 7-10). 20 tablet 0   ibuprofen  (ADVIL ) 200 MG tablet Take 200 mg by mouth every 6 (six) hours as needed for moderate pain (pain score 4-6).     lidocaine  (LIDODERM ) 5 % Place 1 patch onto the skin daily. Remove & Discard patch within 12 hours or as directed by MD (Patient not taking: Reported on 03/25/2024) 30 patch 0   methocarbamol  (ROBAXIN ) 500 MG tablet Take 1 tablet (500 mg total) by mouth every 8 (eight) hours as needed for muscle spasms. 15 tablet 0   polyethylene glycol (MIRALAX / GLYCOLAX) 17 g packet Take 17 g by mouth daily as needed for mild  constipation. 14 each 0   thiamine (VITAMIN B-1) 100 MG tablet Take 1 tablet (100 mg total) by mouth daily. 30 tablet 0   No facility-administered medications prior to visit.     Allergies  Allergen Reactions   Bc Fast Pain Relief [Aspirin -Salicylamide-Caffeine] Other (See Comments)    Upset stomach    Social History   Tobacco Use   Smoking status: Former    Current packs/day: 0.00    Average packs/day: 1 pack/day for 30.0 years (30.0 ttl pk-yrs)    Types: Cigarettes    Start date: 05/1991    Quit  date: 05/2021    Years since quitting: 2.9   Smokeless tobacco: Never  Vaping Use   Vaping status: Never Used  Substance Use Topics   Alcohol use: Yes    Alcohol/week: 4.0 standard drinks of alcohol    Types: 4 Cans of beer per week    Comment: occa   Drug use: Yes    Types: Marijuana, Cocaine    Comment: occassionally (last 05/23/18)    Family History  Problem Relation Age of Onset   Hypertension Mother    Colon cancer Neg Hx     Objective:  There were no vitals filed for this visit. There is no height or weight on file to calculate BMI.  Physical Exam Constitutional:      Appearance: Normal appearance.  HENT:     Head: Normocephalic and atraumatic.     Right Ear: Tympanic membrane normal.     Left Ear: Tympanic membrane normal.     Nose: Nose normal.     Mouth/Throat:     Mouth: Mucous membranes are moist.  Eyes:     Extraocular Movements: Extraocular movements intact.     Conjunctiva/sclera: Conjunctivae normal.     Pupils: Pupils are equal, round, and reactive to light.  Cardiovascular:     Rate and Rhythm: Normal rate and regular rhythm.     Heart sounds: No murmur heard.    No friction rub. No gallop.  Pulmonary:     Effort: Pulmonary effort is normal.     Breath sounds: Normal breath sounds.  Abdominal:     General: Abdomen is flat.     Palpations: Abdomen is soft.  Skin:    General: Skin is warm and dry.  Neurological:     General: No focal deficit present.     Mental Status: He is alert and oriented to person, place, and time.  Psychiatric:        Mood and Affect: Mood normal.     Lab Results: Lab Results  Component Value Date   WBC 8.2 03/30/2024   HGB 9.0 (L) 03/30/2024   HCT 28.3 (L) 03/30/2024   MCV 81.3 03/30/2024   PLT 591 (H) 03/30/2024    Lab Results  Component Value Date   CREATININE 0.89 03/30/2024   BUN 16 03/30/2024   NA 136 03/30/2024   K 4.1 03/30/2024   CL 99 03/30/2024   CO2 28 03/30/2024    Lab Results  Component  Value Date   ALT 14 03/30/2024   AST 26 03/30/2024   ALKPHOS 57 03/30/2024   BILITOT 0.2 03/30/2024     Assessment & Plan:  63 year old male to male with substance abuse, COPD presents for hospital follow-up of MSSA bacteremia with vertebral infection #MSSA bacteremia with L1 vertebral infection - Patient had pain about a month prior to hospitalization, reported trying to smoking crack prior relieve the pain - Blood  cultures on admission grew 2 out of 2 MSSA on 9/29, repeat blood culture 10/2 no growth to date - MR L-spine showed possible small abscess. TEE no veg. primary noted that case discussed with neurosurgery, who does not feel that patient has any upper intervention indication. - Patient discharged on cefazolin  x 8 weeks EOT 11/27  #Medication management #PICC line - Labs:  10/28 WBC 5.4, SCr 0.85, ESR 81, CRP 4(stable) -pull picc after last dose of abx -F/U in one month.      Loney Stank, MD [facility]   05/05/24  2:56 PM I have personally spent 42 minutes involved in face-to-face and non-face-to-face activities for this patient on the day of the visit. Professional time spent includes the following activities: Preparing to see the patient (review of tests), Obtaining and/or reviewing separately obtained history (admission/discharge record), Performing a medically appropriate examination and/or evaluation , Ordering medications/tests/procedures, referring and communicating with other health care professionals, Documenting clinical information in the EMR, Independently interpreting results (not separately reported), Communicating results to the patient/family/caregiver, Counseling and educating the patient/family/caregiver and Care coordination (not separately reported).

## 2024-05-12 ENCOUNTER — Other Ambulatory Visit: Payer: Self-pay | Admitting: *Deleted

## 2024-05-12 ENCOUNTER — Telehealth: Payer: Self-pay | Admitting: *Deleted

## 2024-05-12 DIAGNOSIS — Z122 Encounter for screening for malignant neoplasm of respiratory organs: Secondary | ICD-10-CM

## 2024-05-12 DIAGNOSIS — Z87891 Personal history of nicotine dependence: Secondary | ICD-10-CM

## 2024-05-12 NOTE — Telephone Encounter (Signed)
 Lung Cancer Screening Narrative/Criteria Questionnaire (Cigarette Smokers Only- No Cigars/Pipes/vapes)   Gregory Camacho   SDMV:06/11/24 9:00- [place]                                           [birth year]              LDCT: 06/23/24 10:30- AP    63 y.o.   Phone: [phone]  Lung Screening Narrative (confirm age 17-77 yrs Medicare / 50-80 yrs Private pay insurance)   Insurance information:[employer] [facility]   Referring Provider:Patel   This screening involves an initial phone call with a team member from our program. It is called a shared decision making visit. The initial meeting is required by insurance and Medicare to make sure you understand the program. This appointment takes about 15-20 minutes to complete. The CT scan will completed at a separate date/time. This scan takes about 5-10 minutes to complete and you may eat and drink before and after the scan.  Criteria questions for Lung Cancer Screening:   Are you a current or former smoker? Former Age began smoking: 14   If you are a former smoker, what year did you quit smoking?  2023 (within 15 yrs)   To calculate your smoking history, I need an accurate estimate of how many packs of cigarettes you smoked per day and for how many years. (Not just the number of PPD you are now smoking)   Years smoking 47 x Packs per day 1/2 = Pack years 23   (at least 20 pack yrs)   (Make sure they understand that we need to know how much they have smoked in the past, not just the number of PPD they are smoking now)  Do you have a personal history of cancer?  No    Do you have a family history of cancer? Yes  (cancer type and and relative) sister (?)   Are you coughing up blood?  No  Have you had unexplained weight loss of 15 lbs or more in the last 6 months? No  It looks like you meet all criteria.     Additional information: N/A

## 2024-05-13 ENCOUNTER — Telehealth: Payer: Self-pay

## 2024-05-13 NOTE — Telephone Encounter (Signed)
 Received updated email from Holley Herring requiring your insight  I just called to update Almarie Frost, the RN with [facility] for Mr. Wendorff. She shared that she spoke with the patient today and he was having a lot of itching under his dressing and patient stated he saw pus at his PICC site. The nurse said she directed the patient to go to the ED. The manager at [facility] she just checked and patient is not in the ED. Just wanted to add that to our conversation all are aware  Alan Geralds, PharmD, CPP, BCIDP, AAHIVP Clinical Pharmacist Practitioner Infectious Diseases Clinical Pharmacist [facility]

## 2024-05-13 NOTE — Telephone Encounter (Signed)
 Dr. Dennise provided verbal order to continue antibiotics until planned end date of 11/27. Relayed information to Amy and [facility] pharmacists) via email.  Alan Geralds, PharmD, CPP, BCIDP, AAHIVP Clinical Pharmacist Practitioner Infectious Diseases Clinical Pharmacist [facility]

## 2024-05-13 NOTE — Telephone Encounter (Signed)
 Received following email from Pam with [employer];  Hey Team. We received this in basket on Gregory Camacho for pull picc after dose on 11-27/  I send to the teams as a pull picc order but did not know the 11/27 date was an extension order as well.  Please call Gregory Camacho in the pharmacy [phone] if you can give her or Gregory Camacho a verbal order to continue thru 11/27 as extension to get meds to the home. I am on the road and can not follow as closely as I would like. Thanks so much!   Spoke with Gregory Camacho who states end date on their end was 11/13. Pt has not been off antbx 5 days.  Last opat from 11/6 shows end date for 11/27. Messaged Dr. Dennise to advise if pt should resume antbx or if picc line can be pulled. Will follow up with [facility] once provider send clarification.  Lorenda CHRISTELLA Code, RMA

## 2024-05-23 ENCOUNTER — Ambulatory Visit: Admitting: Physician Assistant

## 2024-05-27 ENCOUNTER — Ambulatory Visit: Admitting: Physician Assistant

## 2024-06-04 ENCOUNTER — Ambulatory Visit: Admitting: Internal Medicine

## 2024-06-11 ENCOUNTER — Encounter: Admitting: Adult Health

## 2024-06-23 ENCOUNTER — Ambulatory Visit (HOSPITAL_COMMUNITY)

## 2024-07-02 ENCOUNTER — Other Ambulatory Visit (HOSPITAL_COMMUNITY): Payer: Self-pay | Admitting: Family Medicine

## 2024-07-02 DIAGNOSIS — M545 Low back pain, unspecified: Secondary | ICD-10-CM

## 2024-07-07 ENCOUNTER — Ambulatory Visit (HOSPITAL_COMMUNITY)
Admission: RE | Admit: 2024-07-07 | Discharge: 2024-07-07 | Disposition: A | Discharge status: Home / Self Care | Source: Ambulatory Visit | Attending: Family Medicine | Admitting: Family Medicine

## 2024-07-07 DIAGNOSIS — M545 Low back pain, unspecified: Secondary | ICD-10-CM | POA: Insufficient documentation

## 2024-08-01 ENCOUNTER — Emergency Department (HOSPITAL_COMMUNITY)
Admission: EM | Admit: 2024-08-01 | Discharge: 2024-08-01 | Discharge status: Against Medical Advice | Source: Home / Self Care

## 2024-08-01 NOTE — ED Triage Notes (Signed)
 Did not want to finish triage due to wait. Is going to call for ride
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
## 2024-11-11 ENCOUNTER — Ambulatory Visit: Admitting: Neurology
